# Patient Record
Sex: Male | Born: 1955 | Hispanic: Yes | Smoking: Never smoker
Health system: Southern US, Community
[De-identification: ages and names within clinical notes are randomized; demographics above are authoritative.]

## PROBLEM LIST (undated history)

## (undated) DIAGNOSIS — I10 Essential (primary) hypertension: Secondary | ICD-10-CM

## (undated) DIAGNOSIS — E119 Type 2 diabetes mellitus without complications: Secondary | ICD-10-CM

## (undated) DIAGNOSIS — N200 Calculus of kidney: Secondary | ICD-10-CM

## (undated) DIAGNOSIS — I2510 Atherosclerotic heart disease of native coronary artery without angina pectoris: Secondary | ICD-10-CM

## (undated) DIAGNOSIS — D759 Disease of blood and blood-forming organs, unspecified: Secondary | ICD-10-CM

## (undated) DIAGNOSIS — E7800 Pure hypercholesterolemia, unspecified: Secondary | ICD-10-CM

## (undated) HISTORY — DX: Atherosclerotic heart disease of native coronary artery without angina pectoris: I25.10

## (undated) HISTORY — DX: Pure hypercholesterolemia, unspecified: E78.00

## (undated) HISTORY — DX: Type 2 diabetes mellitus without complications: E11.9

## (undated) HISTORY — DX: Disease of blood and blood-forming organs, unspecified: D75.9

## (undated) HISTORY — PX: CORONARY ANGIOPLASTY WITH STENT PLACEMENT: SHX49

## (undated) HISTORY — DX: Calculus of kidney: N20.0

## (undated) HISTORY — DX: Essential (primary) hypertension: I10

## (undated) HISTORY — PX: VENA CAVA FILTER PLACEMENT: SHX1085

---

## 2014-09-09 ENCOUNTER — Ambulatory Visit: Payer: Self-pay | Admitting: Internal Medicine

## 2014-11-13 ENCOUNTER — Emergency Department (HOSPITAL_COMMUNITY)
Admission: EM | Admit: 2014-11-13 | Discharge: 2014-11-13 | Disposition: A | Discharge status: Home / Self Care | Payer: 59 | Attending: Emergency Medicine | Admitting: Emergency Medicine

## 2014-11-13 ENCOUNTER — Encounter (HOSPITAL_COMMUNITY): Payer: Self-pay | Admitting: *Deleted

## 2014-11-13 ENCOUNTER — Emergency Department (HOSPITAL_COMMUNITY): Payer: 59

## 2014-11-13 DIAGNOSIS — N201 Calculus of ureter: Secondary | ICD-10-CM | POA: Diagnosis not present

## 2014-11-13 DIAGNOSIS — I10 Essential (primary) hypertension: Secondary | ICD-10-CM | POA: Insufficient documentation

## 2014-11-13 DIAGNOSIS — E119 Type 2 diabetes mellitus without complications: Secondary | ICD-10-CM | POA: Diagnosis not present

## 2014-11-13 DIAGNOSIS — Z87442 Personal history of urinary calculi: Secondary | ICD-10-CM | POA: Diagnosis not present

## 2014-11-13 DIAGNOSIS — Z9861 Coronary angioplasty status: Secondary | ICD-10-CM | POA: Diagnosis not present

## 2014-11-13 DIAGNOSIS — R109 Unspecified abdominal pain: Secondary | ICD-10-CM | POA: Diagnosis present

## 2014-11-13 DIAGNOSIS — Z7902 Long term (current) use of antithrombotics/antiplatelets: Secondary | ICD-10-CM | POA: Insufficient documentation

## 2014-11-13 DIAGNOSIS — Z862 Personal history of diseases of the blood and blood-forming organs and certain disorders involving the immune mechanism: Secondary | ICD-10-CM | POA: Insufficient documentation

## 2014-11-13 DIAGNOSIS — N23 Unspecified renal colic: Secondary | ICD-10-CM

## 2014-11-13 DIAGNOSIS — Z79899 Other long term (current) drug therapy: Secondary | ICD-10-CM | POA: Diagnosis not present

## 2014-11-13 LAB — URINALYSIS, ROUTINE W REFLEX MICROSCOPIC
BILIRUBIN URINE: NEGATIVE
Glucose, UA: NEGATIVE mg/dL
Ketones, ur: NEGATIVE mg/dL
Nitrite: NEGATIVE
PROTEIN: NEGATIVE mg/dL
Specific Gravity, Urine: 1.026 (ref 1.005–1.030)
Urobilinogen, UA: 1 mg/dL (ref 0.0–1.0)
pH: 5.5 (ref 5.0–8.0)

## 2014-11-13 LAB — COMPREHENSIVE METABOLIC PANEL
ALT: 26 U/L (ref 0–53)
AST: 23 U/L (ref 0–37)
Albumin: 4.3 g/dL (ref 3.5–5.2)
Alkaline Phosphatase: 105 U/L (ref 39–117)
Anion gap: 9 (ref 5–15)
BUN: 15 mg/dL (ref 6–23)
CO2: 28 mmol/L (ref 19–32)
Calcium: 9.3 mg/dL (ref 8.4–10.5)
Chloride: 101 mmol/L (ref 96–112)
Creatinine, Ser: 0.95 mg/dL (ref 0.50–1.35)
GFR calc Af Amer: >90 mL/min (ref >90)
Glucose, Bld: 151 mg/dL — ABNORMAL HIGH (ref 70–99)
Potassium: 5.1 mmol/L (ref 3.5–5.1)
Sodium: 138 mmol/L (ref 135–145)
Total Bilirubin: 0.8 mg/dL (ref 0.3–1.2)
Total Protein: 7.3 g/dL (ref 6.0–8.3)

## 2014-11-13 LAB — CBC WITH DIFFERENTIAL/PLATELET
BASOS ABS: 0 10*3/uL (ref 0.0–0.1)
BASOS PCT: 0 % (ref 0–1)
EOS ABS: 0.1 10*3/uL (ref 0.0–0.7)
EOS PCT: 1 % (ref 0–5)
HEMATOCRIT: 44.8 % (ref 39.0–52.0)
Hemoglobin: 15.2 g/dL (ref 13.0–17.0)
Lymphocytes Relative: 15 % (ref 12–46)
Lymphs Abs: 1.4 10*3/uL (ref 0.7–4.0)
MCH: 31.3 pg (ref 26.0–34.0)
MCHC: 33.9 g/dL (ref 30.0–36.0)
MCV: 92.4 fL (ref 78.0–100.0)
Monocytes Absolute: 0.6 10*3/uL (ref 0.1–1.0)
Monocytes Relative: 6 % (ref 3–12)
NEUTROS ABS: 7.7 10*3/uL (ref 1.7–7.7)
NEUTROS PCT: 78 % — AB (ref 43–77)
PLATELETS: 224 10*3/uL (ref 150–400)
RBC: 4.85 MIL/uL (ref 4.22–5.81)
RDW: 14.1 % (ref 11.5–15.5)
WBC: 9.9 10*3/uL (ref 4.0–10.5)

## 2014-11-13 LAB — LIPASE, BLOOD: LIPASE: 27 U/L (ref 11–59)

## 2014-11-13 LAB — URINE MICROSCOPIC-ADD ON

## 2014-11-13 MED ORDER — HYDROMORPHONE HCL 1 MG/ML IJ SOLN
1.0000 mg | Freq: Once | INTRAMUSCULAR | Status: AC
  Administered 2014-11-13: 1 mg via INTRAMUSCULAR
  Filled 2014-11-13: qty 1

## 2014-11-13 MED ORDER — ONDANSETRON 8 MG PO TBDP
8.0000 mg | ORAL_TABLET | Freq: Once | ORAL | Status: AC
  Administered 2014-11-13: 8 mg via ORAL
  Filled 2014-11-13: qty 1

## 2014-11-13 MED ORDER — TAMSULOSIN HCL 0.4 MG PO CAPS: 0.4000 mg | ORAL_CAPSULE | Freq: Every day | ORAL | Status: DC

## 2014-11-13 MED ORDER — OXYCODONE-ACETAMINOPHEN 5-325 MG PO TABS: ORAL_TABLET | ORAL | Status: DC

## 2014-11-13 MED ORDER — KETOROLAC TROMETHAMINE 60 MG/2ML IM SOLN
30.0000 mg | Freq: Once | INTRAMUSCULAR | Status: AC
  Administered 2014-11-13: 30 mg via INTRAMUSCULAR
  Filled 2014-11-13: qty 2

## 2014-11-13 MED ORDER — ONDANSETRON HCL 4 MG PO TABS: 4.0000 mg | ORAL_TABLET | Freq: Three times a day (TID) | ORAL | Status: DC | PRN

## 2014-11-13 NOTE — Discharge Instructions (Signed)
°Emergency Department Resource Guide °1) Find a Doctor and Pay Out of Pocket °Although you won't have to find out who is covered by your insurance plan, it is a good idea to ask around and get recommendations. You will then need to call the office and see if the doctor you have chosen will accept you as a new patient and what types of options they offer for patients who are self-pay. Some doctors offer discounts or will set up payment plans for their patients who do not have insurance, but you will need to ask so you aren't surprised when you get to your appointment. ° °2) Contact Your Local Health Department °Not all health departments have doctors that can see patients for sick visits, but many do, so it is worth a call to see if yours does. If you don't know where your local health department is, you can check in your phone book. The CDC also has a tool to help you locate your state's health department, and many state websites also have listings of all of their local health departments. ° °3) Find a Walk-in Clinic °If your illness is not likely to be very severe or complicated, you may want to try a walk in clinic. These are popping up all over the country in pharmacies, drugstores, and shopping centers. They're usually staffed by nurse practitioners or physician assistants that have been trained to treat common illnesses and complaints. They're usually fairly quick and inexpensive. However, if you have serious medical issues or chronic medical problems, these are probably not your best option. ° °No Primary Care Doctor: °- Call [facility] at  832-8000 - they can help you locate a primary care doctor that  accepts your insurance, provides certain services, etc. °- Physician Referral Service- 1-800-533-3463 ° °Chronic Pain Problems: °Organization         Address  Phone   Notes  °[facility]  (336) 297-2271 Patients need to be referred by their primary care doctor.  ° °Medication  Assistance: °Organization         Address  Phone   Notes  °[place] [facility] [address] °[place], [place] [address] (336) 641-8030 --Must be a resident of [place] °-- Must have NO insurance coverage whatsoever (no Medicaid/ Medicare, etc.) °-- The pt. MUST have a primary care doctor that directs their care regularly and follows them in the community °  °[facility]  (866) 331-1348   °[employer]  (888) 892-1162   ° °Agencies that provide inexpensive medical care: °Organization         Address  Phone   Notes  °[facility]  (336) 832-8035   °[facility]    (336) 832-7272   °[facility] [address] °[place], [place] [address] (336) 832-4777   °[facility] [address], °[place] (336) 271-4999   °[facility]    (336) 373-0678   °[facility]    (336) 272-1050   °[facility] ° [address], [place] Phone:  (336) 832-4444, Fax:  (336) 832-4440 Hours of Operation:  9 am - 6 pm, M-F.  Also accepts Medicaid/Medicare and self-pay.  °[facility] ° [address], [place] Phone: (336) 832-3150, Fax: (336) [phone]. Hours of Operation:  8:30 am - 5:30 pm, M-F.  Also accepts Medicaid and self-pay.  °[facility] [place] [address], [place] Phone: (336) 878-6027   °[facility] [address], [place], [place] (336)723-1848, Ext. 123 Mondays & Thursdays: 7-9 AM.  First 15 patients are seen on a first come, first serve basis. °  ° °Medicaid-accepting [place] Providers: ° °Organization         Address  Phone   Notes  °[facility] [address], [place] (336) 641-2100 Also accepts self-pay patients.  °[facility] [address], [place] ° (336) 856-9996   °[facility] [address], [place]  (336) 288-8857   °[facility] [address] [place] [address], [place] (336) 299-7000   °Veita Bland [address], [place]  ° (336) 373-1557 Only accepts [place] Access Medicaid patients after they have their name applied to their card.  ° °Self-Pay (no insurance) in [place]: ° °Organization         Address  Phone   Notes  °Sickle Cell Patients, [facility] [address], [place] (336) 832-1970   °[facility] [address], [place] (336) 832-4400   °[facility] ° [address], [place] (336) 992-4800   °[facility]/Dr. Osei-Bonsu ° [address] [place] [address], [place] or [address], [place] (336) [phone] Phone number for both [place] and [place] locations is the same.  °[facility] [address], [place] (336) 299-0000   °[facility] [address] [place] [address], [place] or [address] (336) 852-7530 °(336) 878-2260   °[facility] [address], [place] (336) 350-1642, phone; (336) 294-5005, fax Sees patients 1st and 3rd Saturday of every month.  Must not qualify for public or private insurance (i.e. Medicaid, Medicare, [place] Health Choice, Veterans' Benefits) • Household income should be no more than 200% of the poverty level •The clinic cannot treat you if you are pregnant or think you are pregnant • Sexually transmitted diseases are not treated at the clinic.  ° ° °Dental Care: °Organization         Address  Phone  Notes  °[place] [facility] [address], [place] (336) 641-6152 Accepts children up to age 21 who are enrolled in Medicaid or [place] Health Choice; pregnant women with a Medicaid card; and children who have applied for Medicaid or [place] Health Choice, but were declined, whose parents can pay a reduced fee at time of service.  °[place]  [facility] [place]  [address], [place] (336) 641-7733 Accepts children up to age 21 who are enrolled in Medicaid or [place] Health Choice; pregnant women with a Medicaid card; and children who have applied for Medicaid or [place] Health Choice, but were declined, whose parents can pay a reduced fee at time of service.  °[facility] ° [address],  (336) 641-4533 Patients are seen by appointment only. Walk-ins are not accepted. [facility] will see patients 18 years of age and older. °Monday - Tuesday (8am-5pm) °Most Wednesdays (8:30-5pm) °$30 per visit, cash only  °[facility] ° [address], [place] (336) 641-4533 Patients are seen by appointment only. Walk-ins are not accepted. [facility] will see patients 18 years of age and older. °One   Wednesday Evening (Monthly: Volunteer Based).  $30 per visit, cash only  °[facility]  (919) 537-3737 for adults; Children under age 4, call Graduate Pediatric Dentistry at ([phone]) [phone]. Children aged 4-14, please call (919) [phone] to request a pediatric application. ° Dental services are provided in all areas of dental care including fillings, crowns and bridges, complete and partial dentures, implants, gum treatment, root canals, and extractions. Preventive care is also provided. Treatment is provided to both adults and children. °Patients are selected via a lottery and there is often a waiting list. °  °[facility] [address], °[place] ° (336) 763-8833 [email or web address] °  °[facility] [address], [place], [place] (336)723-1848, Ext. 123 Second and Fourth Thursday of each month, opens at 6:30 AM; Clinic ends at 9 AM.  Patients are seen on a first-come first-served basis, and a limited number are seen during each clinic.  ° °[facility] ° [address], [place], [place] (336) 723-7904    Eligibility Requirements °You must have lived in [place], [place], or [place] counties for at least the last three months. °  You cannot be eligible for state or federal sponsored healthcare insurance, including Veterans Administration, Medicaid, or Medicare. °  You generally cannot be eligible for healthcare insurance through your employer.  °  How to apply: °Eligibility screenings are held every Tuesday and Wednesday afternoon from 1:00 pm until 4:00 pm. You do not need an appointment for the interview!  °[facility] [address], [place], [place] 336-631-2330   °[facility]  336-342-8273   °[facility]  336-703-3100   °[facility]  336-570-6415   ° °Behavioral Health Resources in the Community: °Intensive Outpatient Programs °Organization         Address  Phone  Notes  °[facility] [address], [place], [place] 336-878-6098   °[facility] [address], [place], [place] 336-832-9800   °[facility] [address], [place], [place] ° 336-882-2125   °[facility] [address],  °[place], [place] 1-800-853-5163 or 336-641-4981   °Substance Abuse Resources °Organization         Address  Phone  Notes  °[facility]  336-882-2125   °[facility]  336-784-9470   °[facility]  336-285-9073   °[facility]  336-845-3988   °Residential & Outpatient Substance Abuse Program  1-800-659-3381   °Psychological Services °Organization         Address  Phone  Notes  °[facility]  336- 832-9600   °[facility]  336- 378-7881   °[facility] [address], [place] 1-800-853-5163 or 336-641-4981   ° °Mobile Crisis Teams °Organization         Address  Phone  Notes  °[facility], Mobile Crisis Care Unit  1-877-626-1772   °Assertive °[facility] ° [address]  [place], [place] 336-834-9664   °Sharon DeEsch [address] ° [place] 336-554-5454   ° °Self-Help/Support Groups °Organization         Address  Phone             Notes  °[facility]  - variety of support groups  336- 373-1402 Call for more information  °Narcotics Anonymous (NA), [facility] [address], °[place]  2 meetings at this location  ° °  Residential Treatment Programs °Organization         Address  Phone  Notes  °[facility] [address],    °[place]  1-866-801-8205   °[facility] ° [address], [place], [place] 704-293-8524   °[facility] [address], [place] 336-845-3988 Admissions: 8am-3pm M-F  °[facility] [address]    °[place], [place] 336-841-1104   °[facility] [address], [place], [place] 336-379-7146   °[facility] [address]  °[place], [place] 336-285-9073   °[facility] - Intensive Outpatient [address], [place], [place] 336-852-3033   °[facility] [address]  °[place], [place] 1-877-615-2722 or 336-784-9470   °[facility] [address] [place], [place] 336-227-7417 Accepts Medicaid  °[facility] [address]  °[place] 1-800-659-3381 Substance Abuse/Addiction Treatment  ° °[place] Behavioral Health Resources °Organization         Address  Phone  Notes  °[facility]  (888) 581-9988   °Julie Brannon, PhD [address] [place], [place]   (336) 349-5553 or (336) 951-0000   °[facility]   [address] °[place], [place] (336) 349-4454   °[facility] [address], [place], [place] (336) 342-8316 Insurance/Medicaid/sponsorship through [facility]  °[facility] [address] [place] [address]                                    [place], [place] (336) 342-8316 Therapy/tele-psych/case    °[facility] [address]  ° [place], [place] (336) 349-2233    °Dr. Arfeen  (336) 349-4544   °[facility] [place]  [employer] [place] [facility] 1) [address], [place] °2) [address], [place] °3)  [address], [place] (336) 349-3220 °(336) 342-7768 ° °(336) 342-8140   °[place] [facility] (336) 342-1394 or (336) 342-3537 (After Hours)    ° ° ° °Take the prescriptions as directed.  Also take over the counter ibuprofen, 2 tablets by mouth every 4 hours with food, for the next week.  Call the Urologist tomorrow to schedule a follow up appointment this week.  Return to the Emergency Department immediately if worsening. ° °

## 2014-11-13 NOTE — ED Notes (Addendum)
Pt speaks limited English speaks Spanish, pt reports abdominal pain x2 days, pain 7/10, reports dysuria  And blood in urine.   Hx of HTN, has not been taking BP med

## 2014-11-13 NOTE — ED Provider Notes (Signed)
CSN: [id number]     Arrival date & time 11/13/14  1048 History   First MD Initiated Contact with Patient 11/13/14 1102     Chief Complaint  Patient presents with  . Abdominal Pain      HPI Pt was seen at 1110. Per pt, c/o sudden onset and persistence of waxing and waning right sided abd "pain" that began 2 days ago.  Pt describes the pain as "like my last kidney stone," and radiating into the right side of his back.  Has been associated with nausea and hematuria that started 4 days ago.  Denies vomiting/diarrhea, testicular pain/swelling, no dysuria, no black or blood in stools, no CP/SOB.     Past Medical History  Diagnosis Date  . High blood pressure   . High cholesterol   . Diabetes   . Blood disorder   . Kidney stones   . 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 Coronary angioplasty with stent placement    . Vena cava filter placement     Family History  Problem Relation Age of Onset  . Liver cancer Father   . High blood pressure Mother   . Ovarian cancer Mother    History  Substance Use Topics  . Smoking status: Never Smoker   . Smokeless tobacco: Not on file  . Alcohol Use: No    Review of Systems ROS: Statement: All systems negative except as marked or noted in the HPI; Constitutional: Negative for fever and chills. ; ; Eyes: Negative for eye pain, redness and discharge. ; ; ENMT: Negative for ear pain, hoarseness, nasal congestion, sinus pressure and sore throat. ; ; Cardiovascular: Negative for chest pain, palpitations, diaphoresis, dyspnea and peripheral edema. ; ; Respiratory: Negative for cough, wheezing and stridor. ; ; Gastrointestinal: +nausea, abd pain. Negative for vomiting, diarrhea, blood in stool, hematemesis, jaundice and rectal bleeding. . ; ; Genitourinary: Negative for dysuria, flank pain and +hematuria. ; ; Genital:  No penile drainage or rash, no testicular pain or swelling, no scrotal rash or swelling. ;;  Musculoskeletal: Negative for back pain and neck pain. Negative for swelling and trauma.; ; Skin: Negative for pruritus, rash, abrasions, blisters, bruising and skin lesion.; ; Neuro: Negative for headache, lightheadedness and neck stiffness. Negative for weakness, altered level of consciousness , altered mental status, extremity weakness, paresthesias, involuntary movement, seizure and syncope.     Allergies  Review of patient's allergies indicates no known allergies.  Home Medications   Prior to Admission medications   Medication Sig Start Date End Date Taking? Authorizing Provider  atorvastatin (LIPITOR) 20 MG tablet Take 20 mg by mouth daily.   Yes Historical Provider, MD  clopidogrel (PLAVIX) 75 MG tablet Take 75 mg by mouth daily.   Yes Historical Provider, MD  glipiZIDE (GLUCOTROL) 5 MG tablet 5 mg. 2 by mouth daily   Yes Historical Provider, MD  metFORMIN (GLUCOPHAGE-XR) 500 MG 24 hr tablet 500 mg. 2 by mouth daily   Yes Historical Provider, MD  metoprolol succinate (TOPROL-XL) 50 MG 24 hr tablet Take 50 mg by mouth daily.    Yes Historical Provider, MD   BP 132/81 mmHg  Pulse 69  Temp(Src) 98 F (36.7 C) (Oral)  Resp 17  SpO2 100% Physical Exam  1115: Physical examination:  Nursing notes reviewed; Vital signs and O2 SAT reviewed;  Constitutional: Well developed, Well nourished, Well hydrated, Uncomfortable appearing.; Head:  Normocephalic, atraumatic; Eyes: EOMI, PERRL, No  scleral icterus; ENMT: Mouth and pharynx normal, Mucous membranes moist; Neck: Supple, Full range of motion, No lymphadenopathy; Cardiovascular: Regular rate and rhythm, No gallop; Respiratory: Breath sounds clear & equal bilaterally, No wheezes.  Speaking full sentences with ease, Normal respiratory effort/excursion; Chest: Nontender, Movement normal; Abdomen: Soft, Nontender, Nondistended, Normal bowel sounds; Genitourinary: No CVA tenderness; Spine:  No midline CS, TS, LS tenderness.;; Extremities: Pulses  normal, No tenderness, No edema, No calf edema or asymmetry.; Neuro: AA&Ox3, Major CN grossly intact.  Speech clear. No gross focal motor or sensory deficits in extremities.; Skin: Color normal, Warm, Dry.   ED Course  Procedures     EKG Interpretation None      MDM  MDM Reviewed: nursing note and vitals Interpretation: labs and CT scan     Results for orders placed or performed during the hospital encounter of 11/13/14  Comprehensive metabolic panel  Result Value Ref Range   Sodium 138 135 - 145 mmol/L   Potassium 5.1 3.5 - 5.1 mmol/L   Chloride 101 96 - 112 mmol/L   CO2 28 19 - 32 mmol/L   Glucose, Bld 151 (H) 70 - 99 mg/dL   BUN 15 6 - 23 mg/dL   Creatinine, Ser 1.61 0.50 - 1.35 mg/dL   Calcium 9.3 8.4 - 09.6 mg/dL   Total Protein 7.3 6.0 - 8.3 g/dL   Albumin 4.3 3.5 - 5.2 g/dL   AST 23 0 - 37 U/L   ALT 26 0 - 53 U/L   Alkaline Phosphatase 105 39 - 117 U/L   Total Bilirubin 0.8 0.3 - 1.2 mg/dL   GFR calc non Af Amer >90 >90 mL/min   GFR calc Af Amer >90 >90 mL/min   Anion gap 9 5 - 15  Lipase, blood  Result Value Ref Range   Lipase 27 11 - 59 U/L  CBC with Differential  Result Value Ref Range   WBC 9.9 4.0 - 10.5 K/uL   RBC 4.85 4.22 - 5.81 MIL/uL   Hemoglobin 15.2 13.0 - 17.0 g/dL   HCT 04.5 40.9 - 81.1 %   MCV 92.4 78.0 - 100.0 fL   MCH 31.3 26.0 - 34.0 pg   MCHC 33.9 30.0 - 36.0 g/dL   RDW 91.4 78.2 - 95.6 %   Platelets 224 150 - 400 K/uL   Neutrophils Relative % 78 (H) 43 - 77 %   Neutro Abs 7.7 1.7 - 7.7 K/uL   Lymphocytes Relative 15 12 - 46 %   Lymphs Abs 1.4 0.7 - 4.0 K/uL   Monocytes Relative 6 3 - 12 %   Monocytes Absolute 0.6 0.1 - 1.0 K/uL   Eosinophils Relative 1 0 - 5 %   Eosinophils Absolute 0.1 0.0 - 0.7 K/uL   Basophils Relative 0 0 - 1 %   Basophils Absolute 0.0 0.0 - 0.1 K/uL  Urinalysis, Routine w reflex microscopic  Result Value Ref Range   Color, Urine AMBER (A) YELLOW   APPearance CLOUDY (A) CLEAR   Specific Gravity, Urine  1.026 1.005 - 1.030   pH 5.5 5.0 - 8.0   Glucose, UA NEGATIVE NEGATIVE mg/dL   Hgb urine dipstick LARGE (A) NEGATIVE   Bilirubin Urine NEGATIVE NEGATIVE   Ketones, ur NEGATIVE NEGATIVE mg/dL   Protein, ur NEGATIVE NEGATIVE mg/dL   Urobilinogen, UA 1.0 0.0 - 1.0 mg/dL   Nitrite NEGATIVE NEGATIVE   Leukocytes, UA TRACE (A) NEGATIVE  Urine microscopic-add on  Result Value Ref Range  RBC / HPF TOO NUMEROUS TO COUNT <3 RBC/hpf   Bacteria, UA RARE RARE   Urine-Other FIELD OBSCURED BY RBC'S    Ct Abdomen Pelvis Wo Contrast 11/13/2014   CLINICAL DATA:  Right side abdominal pain, dysuria  EXAM: CT ABDOMEN AND PELVIS WITHOUT CONTRAST  TECHNIQUE: Multidetector CT imaging of the abdomen and pelvis was performed following the standard protocol without IV contrast.  COMPARISON:  None.  FINDINGS: Sagittal images shows mild degenerative changes lower thoracic and lumbar spine. The lung bases are unremarkable.  Unenhanced liver is unremarkable. No calcified gallstones are noted within gallbladder. IVC filter in place. Mild atherosclerotic calcifications of distal abdominal aorta and common iliac arteries. No aortic aneurysm.  Unenhanced pancreas, spleen and adrenal glands are unremarkable.  There is mild right hydronephrosis and right hydroureter. Mild right perinephric and periureteral stranding. Nonobstructive calcified calculus in midpole of the right kidney measures 3 mm.  No proximal or mid ureteral calcified calculi are noted bilaterally.  Normal appendix. No pericecal inflammation. A central calcification within prostate gland measures 4.4 mm. The seminal vesicles are unremarkable.  In axial image 78 there is poorly visualized 2 mm calcified calculus in right UVJ/ urinary bladder wall. Mild distension of distal right ureter. No inguinal adenopathy.  Limited assessment of the urinary bladder which is under distended.  No small bowel obstruction.  No ascites or free air.  No adenopathy.  IMPRESSION: 1. There is  mild right hydronephrosis and right hydroureter. Mild right perinephric and periureteral stranding. 2. IVC filter in place. 3. There is poorly visualized 2 mm calcified calculus in right UVJ/urinary bladder wall. 4. Normal appendix.  No pericecal inflammation. 5. No small bowel obstruction.   Electronically Signed   By: Natasha MeadLiviu  Pop M.D.   On: 11/13/2014 12:51    1355:  Pt states he feels better after meds and wants to go home now. Will dose IM toradol before d/c. Will tx ureteral calculi symptomatically at this time, f/u Uro MD. Dx and testing d/w pt.  Questions answered.  Verb understanding, agreeable to d/c home with outpt f/u.   Samuel JesterKathleen Annebelle Bostic, DO 11/17/14 1123

## 2016-12-07 IMAGING — CT CT ABD-PELV W/O CM
1 series · 15 of 29 positions shown, 19 images · non-contrast
Comparison: None.

CLINICAL DATA: Right side abdominal pain, dysuria

EXAM:
CT ABDOMEN AND PELVIS WITHOUT CONTRAST
TECHNIQUE: Multidetector CT imaging of the abdomen and pelvis was performed
following the standard protocol without IV contrast.

[Series 4: lung · axial · 0.70mm/px · z∈[-181,-56]mm · 15 of 29 slices shown, 19 images]
[im 3/29  soft-tissue]
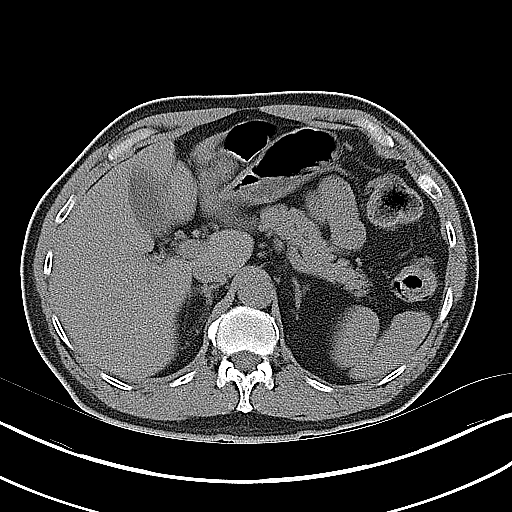
[im 3/29  bone]
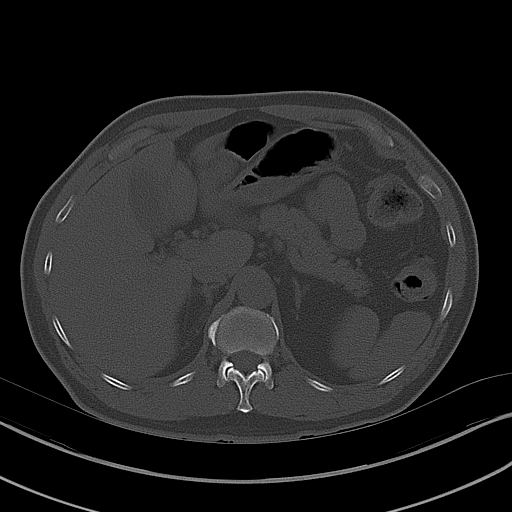
[im 5/29  soft-tissue]
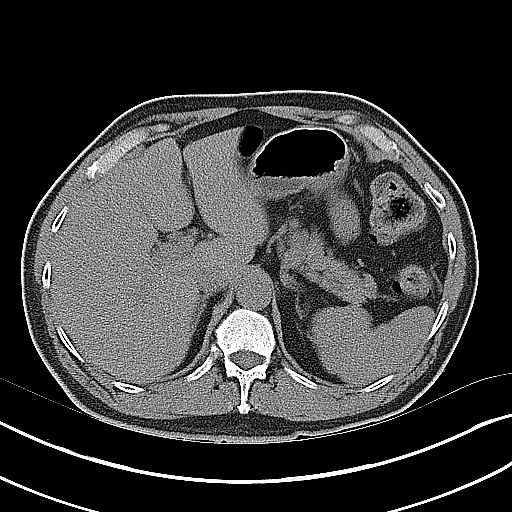
[im 7/29  soft-tissue]
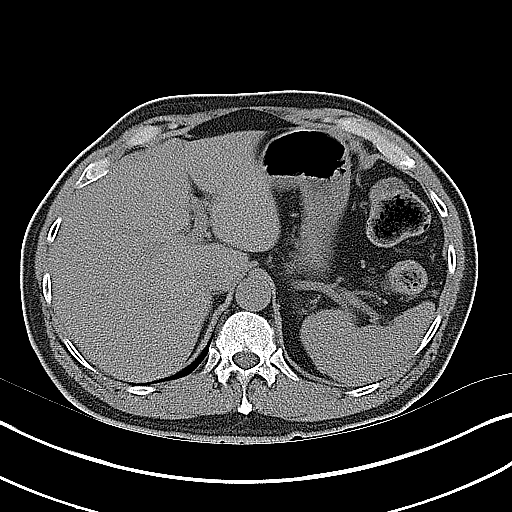
[im 9/29  soft-tissue]
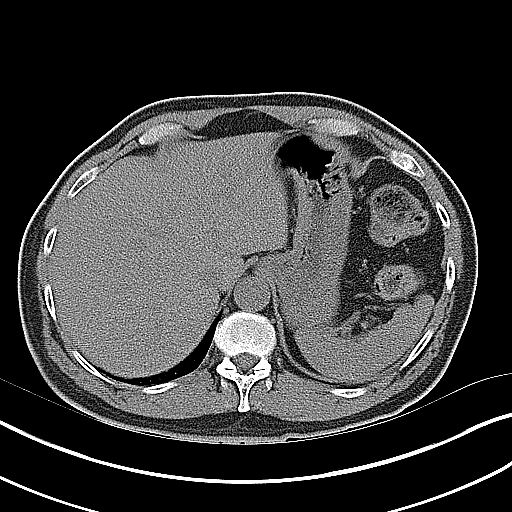
[im 11/29  soft-tissue]
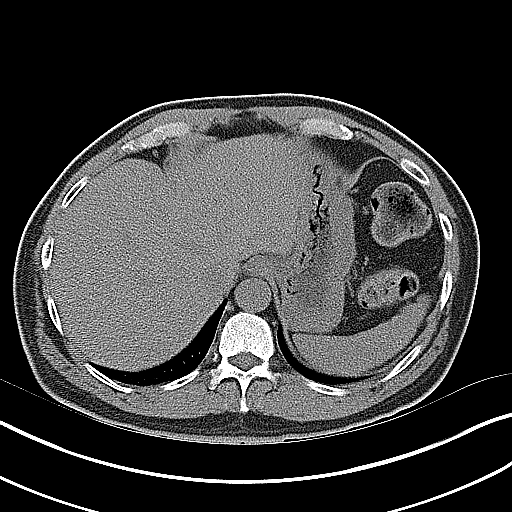
[im 13/29  soft-tissue]
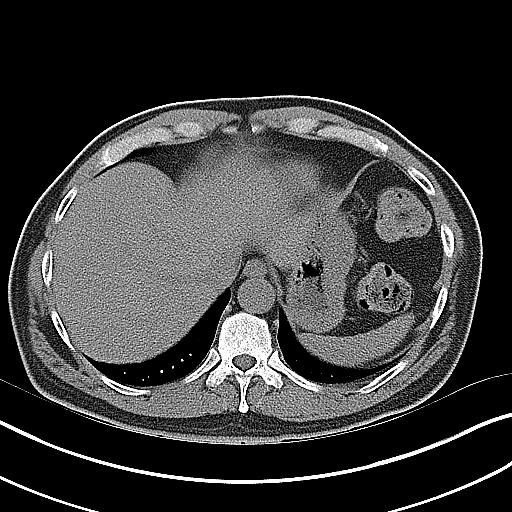
[im 15/29  soft-tissue]
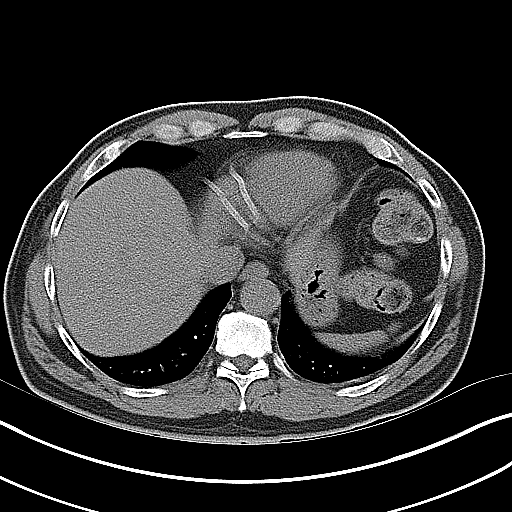
[im 17/29  soft-tissue]
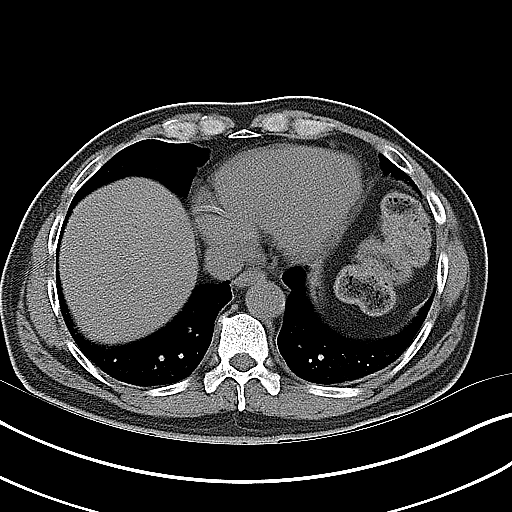
[im 19/29  soft-tissue]
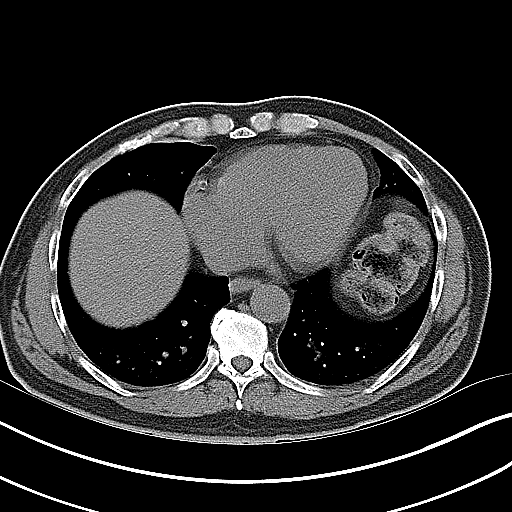
[im 19/29  bone]
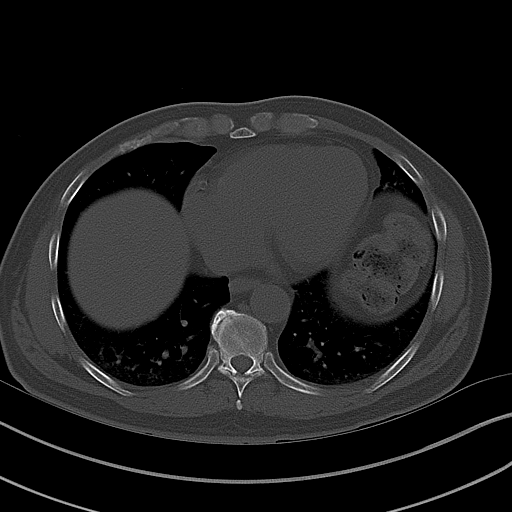
[im 21/29  soft-tissue]
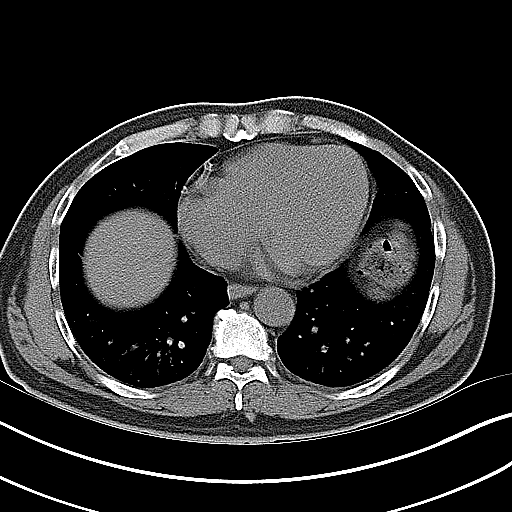
[im 23/29  soft-tissue]
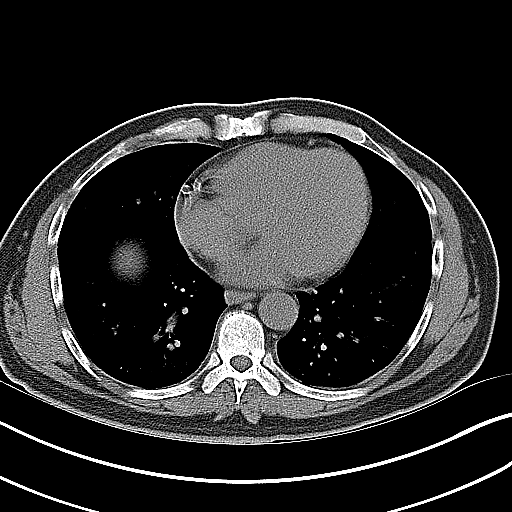
[im 25/29  soft-tissue]
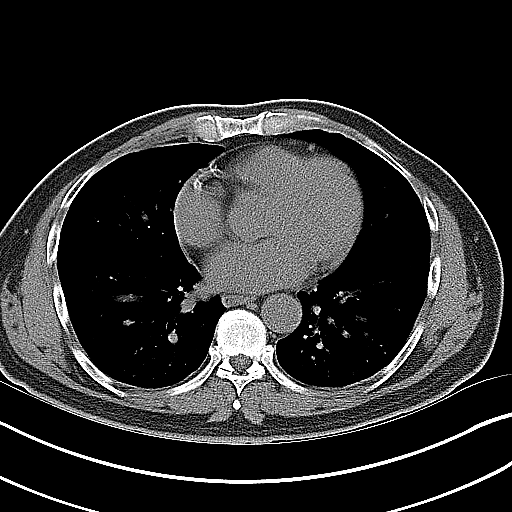
[im 25/29  lung]
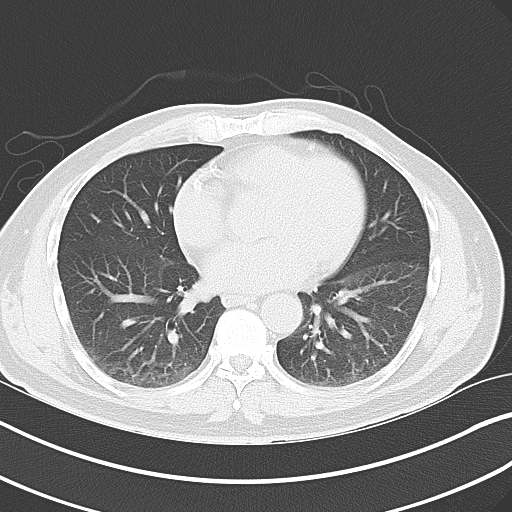
[im 26/29  lung]
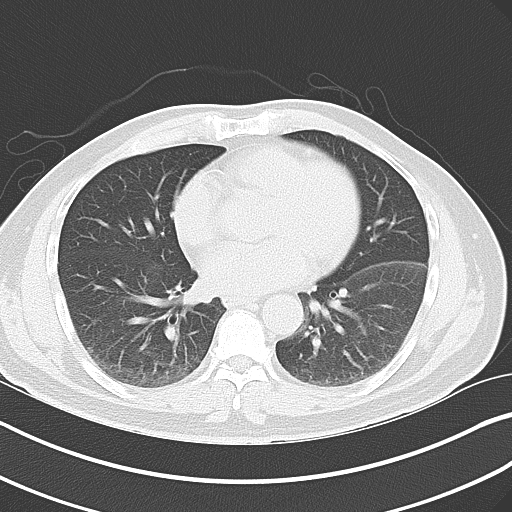
[im 27/29  soft-tissue]
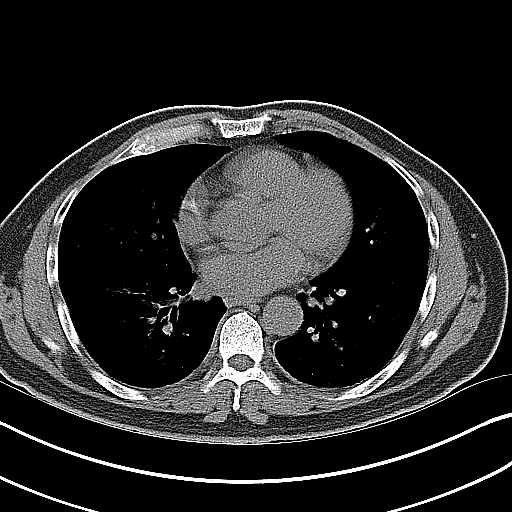
[im 27/29  lung]
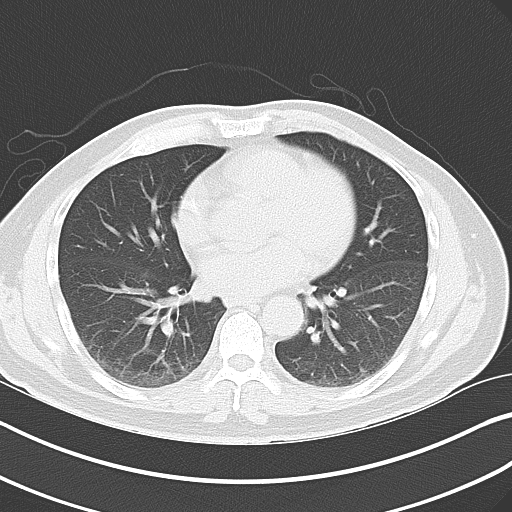
[im 28/29  lung]
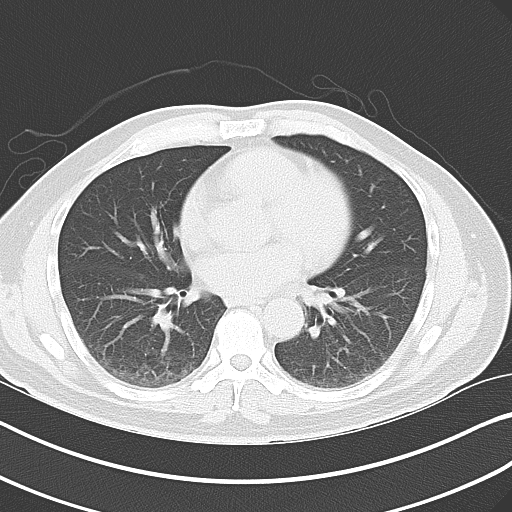

[15 of 29 positions shown; findings below may reference images not displayed]

FINDINGS: Sagittal images shows mild degenerative changes lower thoracic and
lumbar spine. The lung bases are unremarkable.

Unenhanced liver is unremarkable. No calcified gallstones are noted
within gallbladder. IVC filter in place. Mild atherosclerotic
calcifications of distal abdominal aorta and common iliac arteries.
No aortic aneurysm.

Unenhanced pancreas, spleen and adrenal glands are unremarkable.

There is mild right hydronephrosis and right hydroureter. Mild right
perinephric and periureteral stranding. Nonobstructive calcified
calculus in midpole of the right kidney measures 3 mm.

No proximal or mid ureteral calcified calculi are noted bilaterally.

Normal appendix. No pericecal inflammation. A central calcification
within prostate gland measures 4.4 mm. The seminal vesicles are
unremarkable.

In axial image 78 there is poorly visualized 2 mm calcified calculus
in right UVJ/ urinary bladder wall. Mild distension of distal right
ureter. No inguinal adenopathy.

Limited assessment of the urinary bladder which is under distended.

No small bowel obstruction.  No ascites or free air.  No adenopathy.
IMPRESSION: 1. There is mild right hydronephrosis and right hydroureter. Mild
right perinephric and periureteral stranding.
2. IVC filter in place.
3. There is poorly visualized 2 mm calcified calculus in right
UVJ/urinary bladder wall.
4. Normal appendix.  No pericecal inflammation.
5. No small bowel obstruction.

## 2017-05-28 ENCOUNTER — Emergency Department (HOSPITAL_COMMUNITY): Payer: Worker's Compensation

## 2017-05-28 ENCOUNTER — Encounter (HOSPITAL_COMMUNITY): Payer: Self-pay | Admitting: Emergency Medicine

## 2017-05-28 ENCOUNTER — Emergency Department (HOSPITAL_COMMUNITY)
Admission: EM | Admit: 2017-05-28 | Discharge: 2017-05-28 | Disposition: A | Discharge status: Home / Self Care | Payer: Worker's Compensation | Attending: Emergency Medicine | Admitting: Emergency Medicine

## 2017-05-28 DIAGNOSIS — M79631 Pain in right forearm: Secondary | ICD-10-CM | POA: Diagnosis present

## 2017-05-28 DIAGNOSIS — I10 Essential (primary) hypertension: Secondary | ICD-10-CM

## 2017-05-28 DIAGNOSIS — Z7984 Long term (current) use of oral hypoglycemic drugs: Secondary | ICD-10-CM | POA: Diagnosis not present

## 2017-05-28 DIAGNOSIS — Z79899 Other long term (current) drug therapy: Secondary | ICD-10-CM | POA: Diagnosis not present

## 2017-05-28 DIAGNOSIS — E119 Type 2 diabetes mellitus without complications: Secondary | ICD-10-CM | POA: Insufficient documentation

## 2017-05-28 NOTE — ED Provider Notes (Signed)
[facility]-EMERGENCY DEPT Provider Note   CSN: [id number] Arrival date & time: 05/28/17  4098     History   Chief Complaint Chief Complaint  Patient presents with  . Arm Pain    HPI Mike [name] is a 61 y.o. male.  HPI Patient presents with right forearm pain.  Began yesterday while he was lifting heavy boxes.  States it began acutely.  There is no fall.  No numbness or weakness.  States the pain has continued today.  No numbness or weakness.  It is on the upper forearm and worse when he turns his forearm.  Has not had pains like this before. Past Medical History:  Diagnosis Date  . Blood disorder   . Diabetes (HCC)   . High blood pressure   . High cholesterol   . Kidney stones   . 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   Past Surgical History:  Procedure Laterality Date  . CORONARY ANGIOPLASTY WITH STENT PLACEMENT    . VENA CAVA FILTER PLACEMENT         Home Medications    Prior to Admission medications   Medication Sig Start Date End Date Taking? Authorizing Provider  atorvastatin (LIPITOR) 20 MG tablet Take 20 mg by mouth daily.    [provider]  clopidogrel (PLAVIX) 75 MG tablet Take 75 mg by mouth daily.    [provider]  glipiZIDE (GLUCOTROL) 5 MG tablet 5 mg. 2 by mouth daily    [provider]  metFORMIN (GLUCOPHAGE-XR) 500 MG 24 hr tablet 500 mg. 2 by mouth daily    [provider]  metoprolol succinate (TOPROL-XL) 50 MG 24 hr tablet Take 50 mg by mouth daily.     [provider]  ondansetron (ZOFRAN) 4 MG tablet Take 1 tablet (4 mg total) by mouth every 8 (eight) hours as needed for nausea or vomiting. 11/13/14   Samuel Jester, DO  oxyCODONE-acetaminophen (PERCOCET/ROXICET) 5-325 MG per tablet 1 or 2 tabs PO q6h prn pain 11/13/14   Samuel Jester, DO  tamsulosin (FLOMAX) 0.4 MG CAPS capsule Take 1 capsule (0.4 mg total) by mouth at  bedtime. 11/13/14   Samuel Jester, DO    Family History Family History  Problem Relation Age of Onset  . Liver cancer Father   . High blood pressure Mother   . Ovarian cancer Mother     Social History Social History  Substance Use Topics  . Smoking status: Never Smoker  . Smokeless tobacco: Not on file  . Alcohol use No     Allergies   Patient has no known allergies.   Review of Systems Review of Systems  HENT: Negative for congestion.   Musculoskeletal: Negative for neck pain.       Right forearm pain.   Neurological: Negative for weakness and numbness.     Physical Exam Updated Vital Signs BP (!) 151/96 (BP Location: Left Arm)   Pulse 76   Temp 98.1 F (36.7 C) (Oral)   Resp 16   Ht 5\' 7"  (1.702 m)   Wt 76.2 kg (168 lb)   SpO2 100%   BMI 26.31 kg/m   Physical Exam  Constitutional: He appears well-developed.  Cardiovascular: Normal rate.   Musculoskeletal: He exhibits tenderness.  Tenderness to right forearm proximally.  Worse with pronation and supination of forearm.  Neurovascularly intact in hand.  Good strength in hand and good flexion  extension to fingers wrist elbows and shoulder.  Skin: Skin is warm. Capillary refill takes less than 2 seconds.     ED Treatments / Results  Labs (all labs ordered are listed, but only abnormal results are displayed) Labs Reviewed - No data to display  EKG  EKG Interpretation None       Radiology Dg Forearm Right  Result Date: 05/28/2017 CLINICAL DATA:  Generalized right forearm pain after lifting yesterday, no specific injury. EXAM: RIGHT FOREARM - 2 VIEW COMPARISON:  None. FINDINGS: No acute osseous abnormality. IMPRESSION: No acute osseous abnormality. Electronically Signed   By: Leanna BattlesMelinda  Blietz M.D.   On: 05/28/2017 09:30    Procedures Procedures (including critical care time)  Medications Ordered in ED Medications - No data to display   Initial Impression / Assessment and Plan / ED Course    I have reviewed the triage vital signs and the nursing notes.  Pertinent labs & imaging results that were available during my care of the patient were reviewed by me and considered in my medical decision making (see chart for details).     Patient with forearm pain.  Did have some bony tenderness over the radius.  X-ray reassuring.  Has high blood pressure will need to be followed by his primary care doctor.  Anti-inflammatories for the forearm.  Follow-up as needed.  Final Clinical Impressions(s) / ED Diagnoses   Final diagnoses:  Pain of right forearm  Hypertension, unspecified type    New Prescriptions New Prescriptions   No medications on file     Benjiman CorePickering, Elnathan Fulford, MD 05/28/17 1022

## 2017-05-28 NOTE — Discharge Instructions (Signed)
Use Motrin to help with the pain.  Follow-up with Ortho as needed.  Your blood pressure was also elevated here and needs to be followed by her primary care doctor.

## 2017-05-28 NOTE — ED Triage Notes (Signed)
Pt complaint of right forearm pain post lifting boxes yesterday.

## 2022-06-15 ENCOUNTER — Emergency Department (HOSPITAL_COMMUNITY): Payer: Self-pay

## 2022-06-15 ENCOUNTER — Observation Stay (HOSPITAL_COMMUNITY): Payer: Self-pay

## 2022-06-15 ENCOUNTER — Other Ambulatory Visit: Payer: Self-pay

## 2022-06-15 ENCOUNTER — Observation Stay (HOSPITAL_COMMUNITY)
Admission: EM | Admit: 2022-06-15 | Discharge: 2022-06-17 | Disposition: A | Discharge status: Home / Self Care | Payer: Self-pay | Attending: Emergency Medicine | Admitting: Emergency Medicine

## 2022-06-15 ENCOUNTER — Encounter (HOSPITAL_COMMUNITY): Payer: Self-pay | Admitting: Emergency Medicine

## 2022-06-15 DIAGNOSIS — I152 Hypertension secondary to endocrine disorders: Secondary | ICD-10-CM | POA: Insufficient documentation

## 2022-06-15 DIAGNOSIS — I639 Cerebral infarction, unspecified: Principal | ICD-10-CM | POA: Diagnosis present

## 2022-06-15 DIAGNOSIS — R079 Chest pain, unspecified: Secondary | ICD-10-CM | POA: Insufficient documentation

## 2022-06-15 DIAGNOSIS — I5021 Acute systolic (congestive) heart failure: Secondary | ICD-10-CM

## 2022-06-15 DIAGNOSIS — E119 Type 2 diabetes mellitus without complications: Secondary | ICD-10-CM

## 2022-06-15 DIAGNOSIS — E1169 Type 2 diabetes mellitus with other specified complication: Secondary | ICD-10-CM | POA: Insufficient documentation

## 2022-06-15 DIAGNOSIS — I10 Essential (primary) hypertension: Secondary | ICD-10-CM | POA: Insufficient documentation

## 2022-06-15 DIAGNOSIS — I6381 Other cerebral infarction due to occlusion or stenosis of small artery: Secondary | ICD-10-CM

## 2022-06-15 DIAGNOSIS — R531 Weakness: Secondary | ICD-10-CM

## 2022-06-15 LAB — RAPID URINE DRUG SCREEN, HOSP PERFORMED
Amphetamines: NOT DETECTED
Barbiturates: NOT DETECTED
Benzodiazepines: NOT DETECTED
Cocaine: NOT DETECTED
Opiates: NOT DETECTED
Tetrahydrocannabinol: NOT DETECTED

## 2022-06-15 LAB — I-STAT CHEM 8, ED
BUN: 16 mg/dL (ref 8–23)
Calcium, Ion: 1.13 mmol/L — ABNORMAL LOW (ref 1.15–1.40)
Chloride: 98 mmol/L (ref 98–111)
Creatinine, Ser: 0.7 mg/dL (ref 0.61–1.24)
Glucose, Bld: 306 mg/dL — ABNORMAL HIGH (ref 70–99)
HCT: 50 % (ref 39.0–52.0)
Hemoglobin: 17 g/dL (ref 13.0–17.0)
Potassium: 4.3 mmol/L (ref 3.5–5.1)
Sodium: 137 mmol/L (ref 135–145)
TCO2: 27 mmol/L (ref 22–32)

## 2022-06-15 LAB — URINALYSIS, ROUTINE W REFLEX MICROSCOPIC
Bacteria, UA: NONE SEEN
Bilirubin Urine: NEGATIVE
Glucose, UA: >=500 mg/dL — AB
Hgb urine dipstick: NEGATIVE
Ketones, ur: 20 mg/dL — AB
Leukocytes,Ua: NEGATIVE
Nitrite: NEGATIVE
Protein, ur: NEGATIVE mg/dL
Specific Gravity, Urine: 1.031 — ABNORMAL HIGH (ref 1.005–1.030)
pH: 7 (ref 5.0–8.0)

## 2022-06-15 LAB — DIFFERENTIAL
Abs Immature Granulocytes: 0.05 10*3/uL (ref 0.00–0.07)
Basophils Absolute: 0 10*3/uL (ref 0.0–0.1)
Basophils Relative: 0 %
Eosinophils Absolute: 0 10*3/uL (ref 0.0–0.5)
Eosinophils Relative: 1 %
Immature Granulocytes: 1 %
Lymphocytes Relative: 18 %
Lymphs Abs: 1.4 10*3/uL (ref 0.7–4.0)
Monocytes Absolute: 0.5 10*3/uL (ref 0.1–1.0)
Monocytes Relative: 6 %
Neutro Abs: 5.7 10*3/uL (ref 1.7–7.7)
Neutrophils Relative %: 74 %

## 2022-06-15 LAB — COMPREHENSIVE METABOLIC PANEL
ALT: 29 U/L (ref 0–44)
AST: 21 U/L (ref 15–41)
Albumin: 4.1 g/dL (ref 3.5–5.0)
Alkaline Phosphatase: 118 U/L (ref 38–126)
Anion gap: 15 (ref 5–15)
BUN: 13 mg/dL (ref 8–23)
CO2: 24 mmol/L (ref 22–32)
Calcium: 9.4 mg/dL (ref 8.9–10.3)
Chloride: 97 mmol/L — ABNORMAL LOW (ref 98–111)
Creatinine, Ser: 0.82 mg/dL (ref 0.61–1.24)
GFR, Estimated: >60 mL/min (ref >60)
Glucose, Bld: 307 mg/dL — ABNORMAL HIGH (ref 70–99)
Potassium: 4 mmol/L (ref 3.5–5.1)
Sodium: 136 mmol/L (ref 135–145)
Total Bilirubin: 0.6 mg/dL (ref 0.3–1.2)
Total Protein: 7.5 g/dL (ref 6.5–8.1)

## 2022-06-15 LAB — CBC
HCT: 46.2 % (ref 39.0–52.0)
Hemoglobin: 16 g/dL (ref 13.0–17.0)
MCH: 30.5 pg (ref 26.0–34.0)
MCHC: 34.6 g/dL (ref 30.0–36.0)
MCV: 88.2 fL (ref 80.0–100.0)
Platelets: 234 10*3/uL (ref 150–400)
RBC: 5.24 MIL/uL (ref 4.22–5.81)
RDW: 13.2 % (ref 11.5–15.5)
WBC: 7.8 10*3/uL (ref 4.0–10.5)
nRBC: 0 % (ref 0.0–0.2)

## 2022-06-15 LAB — APTT: aPTT: 35 seconds (ref 24–36)

## 2022-06-15 LAB — TROPONIN I (HIGH SENSITIVITY)
Troponin I (High Sensitivity): 28 ng/L — ABNORMAL HIGH (ref <18)
Troponin I (High Sensitivity): 34 ng/L — ABNORMAL HIGH (ref <18)
Troponin I (High Sensitivity): 34 ng/L — ABNORMAL HIGH (ref <18)

## 2022-06-15 LAB — TSH: TSH: 1.535 u[IU]/mL (ref 0.350–4.500)

## 2022-06-15 LAB — LIPID PANEL
Cholesterol: 277 mg/dL — ABNORMAL HIGH (ref 0–200)
HDL: 31 mg/dL — ABNORMAL LOW (ref >40)
Total CHOL/HDL Ratio: 8.9 RATIO
Triglycerides: 135 mg/dL (ref <150)
VLDL: 27 mg/dL (ref 0–40)

## 2022-06-15 LAB — CBG MONITORING, ED: Glucose-Capillary: 282 mg/dL — ABNORMAL HIGH (ref 70–99)

## 2022-06-15 LAB — PROTIME-INR
INR: 1 (ref 0.8–1.2)
Prothrombin Time: 12.9 seconds (ref 11.4–15.2)

## 2022-06-15 LAB — ETHANOL: Alcohol, Ethyl (B): <10 mg/dL (ref <10)

## 2022-06-15 LAB — HIV ANTIBODY (ROUTINE TESTING W REFLEX): HIV Screen 4th Generation wRfx: NONREACTIVE

## 2022-06-15 MED ORDER — ATORVASTATIN CALCIUM 40 MG PO TABS
40.0000 mg | ORAL_TABLET | Freq: Every day | ORAL | Status: DC
  Administered 2022-06-16: 40 mg via ORAL
  Filled 2022-06-15: qty 1

## 2022-06-15 MED ORDER — CLOPIDOGREL BISULFATE 75 MG PO TABS: 75.0000 mg | ORAL_TABLET | Freq: Once | ORAL | Status: DC

## 2022-06-15 MED ORDER — ASPIRIN 325 MG PO TABS
325.0000 mg | ORAL_TABLET | Freq: Once | ORAL | Status: AC
  Administered 2022-06-15: 325 mg via ORAL
  Filled 2022-06-15: qty 1

## 2022-06-15 MED ORDER — CLOPIDOGREL BISULFATE 75 MG PO TABS
75.0000 mg | ORAL_TABLET | Freq: Every day | ORAL | Status: DC
  Administered 2022-06-16 – 2022-06-17 (×2): 75 mg via ORAL
  Filled 2022-06-15 (×2): qty 1

## 2022-06-15 MED ORDER — ENOXAPARIN SODIUM 40 MG/0.4ML IJ SOSY
40.0000 mg | PREFILLED_SYRINGE | INTRAMUSCULAR | Status: DC
  Administered 2022-06-15 – 2022-06-16 (×2): 40 mg via SUBCUTANEOUS
  Filled 2022-06-15 (×2): qty 0.4

## 2022-06-15 MED ORDER — ASPIRIN 81 MG PO TBEC: 81.0000 mg | DELAYED_RELEASE_TABLET | Freq: Once | ORAL | Status: DC

## 2022-06-15 MED ORDER — ASPIRIN 81 MG PO TBEC: 81.0000 mg | DELAYED_RELEASE_TABLET | Freq: Every day | ORAL | Status: DC

## 2022-06-15 MED ORDER — CLOPIDOGREL BISULFATE 300 MG PO TABS
300.0000 mg | ORAL_TABLET | Freq: Once | ORAL | Status: AC
  Administered 2022-06-15: 300 mg via ORAL
  Filled 2022-06-15: qty 1

## 2022-06-15 MED ORDER — IOHEXOL 350 MG/ML SOLN
75.0000 mL | Freq: Once | INTRAVENOUS | Status: AC | PRN
  Administered 2022-06-15: 75 mL via INTRAVENOUS

## 2022-06-15 NOTE — Assessment & Plan Note (Addendum)
History of DM. Reports taking medications in the last but self discontinued due to side effects. -A1c pending -add sSSI as appropriate

## 2022-06-15 NOTE — H&P (Addendum)
Hospital Admission History and Physical Service Pager: [phone]  Patient name: Mike [name] Medical record number: [id number] Date of Birth: 11/10/1955 Age: 66 y.o. Gender: male  Primary Care Provider: Patient, No Pcp Per Consultants: Neurology Code Status: Full code  Preferred Emergency Contact: Rosaria Ferriesvan General (brother) [phone]  Chief Complaint: left-sided weakness  Assessment and Plan: Mike [name] is a 66 y.o. male presenting with stroke-like symptoms . Differential for this patient's presentation of this includes CVA (MRI and CT confirms this).    * Left-sided weakness Patient presented with left-sided weakness in upper and lower extremities and facial droop since yesterday. CT and MRI shows lacunar infarct on the right side. This is likely due to uncontrolled HTN, HLD, and DM. -Admit to [facility] med telemetry, attending Dr. Deirdre Priesthambliss -Neurology following, appreciate recs -Permissive HTN for 1-2 days -PT/OT/SLP -pending TSH, A1c -echo -am labs -Neurochecks -Up with assistance -NPO pending swallow screen -Vitals per routine   Diabetes (HCC) History of DM. Reports taking medications in the last but self discontinued due to side effects. -A1c pending -add sSSI as appropriate    HTN (hypertension) SBP ranging from 170-220s. Currently not on any medications in the setting of CVA. -Permissive HTN for 1-2 days  -hold home antihypertensive given permissive hypertension window  -Continue to monitor BP  Chest pain History of MI in 2014. Patient reports chest pain that started yesterday. -Repeat EKG -Troponin pending -CXR pending    Chronic medication conditions HLD-Will start statin per neurology recommendations  FEN/GI: NPO until swallow eval complete VTE Prophylaxis: lovenox  Disposition: home  History of Present Illness:  Mike [name] is a 66 y.o. male presenting with left-sided weakness and left facial droop. Patient reports that last night he noticed he  was experiencing left-sided weakness and facial droop on that side. Sister says that he has also had slurred speech. Patient was not able to pick up anything with his hands, he tried to grab a glass but was unable to do so. He also was unable to sit in his chair. Denies this ever occurring before. Denies fever, chills, pain and  headache. Endorsing chest pain and a little dyspnea that has worsened compared to normal that started last night that does not radiate. He also noticed vision changes about a week ago that occur intermittently with blurry vision at times. He was taking meds for his hypertension and cholesterol but stopped since he was feeling better. Also prescribed DM but not taking that as well. He stopped taking all these years ago, he is unsure of exactly when.     In the ED, CT and MRI performed.  Review Of Systems: Per HPI with the following additions:   Pertinent Past Medical History: Hypertension Hyperlipidemia DM   Pertinent Past Surgical History: None   Remainder reviewed in history tab.  Pertinent Social History: Tobacco use: No Alcohol use: occasionally before but none now  Other Substance use: none Lives with alone.  Pertinent Family History: Mother: hypertension   Remainder reviewed in history tab.   Important Outpatient Medications: Melatonin  Remainder reviewed in medication history.   Objective: BP (!) 140/92   Pulse 78   Temp 98.4 F (36.9 C) (Oral)   Resp 16   SpO2 96%  Exam: General: NAD, well-appearing, pleasant male in bed Cardiovascular: RRR, no m/r/g. No LE edema. Respiratory: Normal effort. CTAB.  Gastrointestinal: Normal bowel sounds.  Neuro:  A&Ox3. CN II-XII intact. Left sided facial droop present. Fine motor movements diminished on the  left side. Moves all extremities. Bilateral grip strength intact. 4/5 strength LUE and LLE. 5/5 strength RUE and RLE. Normal sensation. Dysarthria present on exam.   Labs:  CBC BMET  Recent Labs   Lab 06/15/22 1056 06/15/22 1103  WBC 7.8  --   HGB 16.0 17.0  HCT 46.2 50.0  PLT 234  --    Recent Labs  Lab 06/15/22 1056 06/15/22 1103  NA 136 137  K 4.0 4.3  CL 97* 98  CO2 24  --   BUN 13 16  CREATININE 0.82 0.70  GLUCOSE 307* 306*  CALCIUM 9.4  --       EKG: sinus tachycardia   Imaging Studies Performed:  DG CHEST PORT 1 VIEW  Result Date: 06/15/2022 CLINICAL DATA:  Dyspnea, left arm weakness EXAM: PORTABLE CHEST 1 VIEW COMPARISON:  None Available. FINDINGS: Mild cardiomegaly. Both lungs are clear. The visualized skeletal structures are unremarkable. IMPRESSION: Mild cardiomegaly without acute abnormality of the lungs in AP portable projection. Electronically Signed   By: Jearld Lesch M.D.   On: 06/15/2022 15:31   MR BRAIN WO CONTRAST  Result Date: 06/15/2022 CLINICAL DATA:  67 year old male code stroke presentation with age indeterminate right hemisphere lacunar infarct, abnormal CTA suspicious for intracranial atherosclerosis versus thromboembolic disease. EXAM: MRI HEAD WITHOUT CONTRAST TECHNIQUE: Multiplanar, multiecho pulse sequences of the brain and surrounding structures were obtained without intravenous contrast. COMPARISON:  CT head and CTA head and neck earlier today. FINDINGS: Brain: Confluent diffusion restriction in the right corona radiata and tracking to the right lentiform corresponding to the earlier plain CT finding. Associated T2 and FLAIR hyperintensity. No hemorrhage or mass effect. No other restricted diffusion. No midline shift, mass effect, evidence of mass lesion, ventriculomegaly, extra-axial collection or acute intracranial hemorrhage. Cervicomedullary junction and pituitary are within normal limits. Additional mild scattered bilateral, mostly subcortical, white matter T2 and FLAIR hyperintensity. No cortical encephalomalacia or chronic cerebral blood products identified. Left basal ganglia, bilateral thalami, brainstem and cerebellum appear  negative. Vascular: Major intracranial vascular flow voids are preserved. Skull and upper cervical spine: Negative. Visualized bone marrow signal is within normal limits. Sinuses/Orbits: Rightward gaze now. Paranasal sinuses remain well aerated. Other: Mastoids are clear.  Negative visible scalp and face. IMPRESSION: 1. Acute lacunar infarct in the Right corona radiata and lentiform corresponding to the earlier plain CT finding. No associated hemorrhage or mass effect. Preliminary report of this was discussed by telephone with Dr. Derry Lory on 06/15/2022 at 1215 hours. 2. Underlying mild for age nonspecific cerebral white matter signal changes. Electronically Signed   By: Odessa Fleming M.D.   On: 06/15/2022 13:03   CT ANGIO HEAD NECK W WO CM  Result Date: 06/15/2022 CLINICAL DATA:  Code stroke. 66 year old male with facial droop and weakness last known well 2300 hours. EXAM: CT ANGIOGRAPHY HEAD AND NECK TECHNIQUE: Multidetector CT imaging of the head and neck was performed using the standard protocol during bolus administration of intravenous contrast. Multiplanar CT image reconstructions and MIPs were obtained to evaluate the vascular anatomy. Carotid stenosis measurements (when applicable) are obtained utilizing NASCET criteria, using the distal internal carotid diameter as the denominator. RADIATION DOSE REDUCTION: This exam was performed according to the departmental dose-optimization program which includes automated exposure control, adjustment of the mA and/or kV according to patient size and/or use of iterative reconstruction technique. CONTRAST:  91mL OMNIPAQUE IOHEXOL 350 MG/ML SOLN COMPARISON:  Plain head CT 1104 hours today. FINDINGS: CTA NECK Skeleton: Carious posterior dentition on the  left. Mild for age cervical spine degeneration. No acute osseous abnormality identified. Upper chest: Negative. Other neck: Negative. Aortic arch: 3 vessel arch configuration. Soft more so than calcified arch  atherosclerosis. Right carotid system: Brachiocephalic and proximal right CCA soft plaque without stenosis. Intermittent soft plaque before the bifurcation. Mild to moderate soft plaque at the bifurcation and faint calcified right ICA bulb plaque without stenosis. Left carotid system: Similar mostly soft plaque with no significant stenosis. Vertebral arteries: Mild proximal right subclavian artery plaque without stenosis. Moderate soft plaque at the right vertebral artery origin with moderate stenosis on series 8, image 137. Right vertebral otherwise negative to the skull base. Left subclavian artery bulky soft plaque resulting in estimated 56% stenosis (series 7, image 291) but only mildly affecting the left vertebral artery origin on series 8, image 140. No significant origin stenosis. Mildly dominant left vertebral otherwise negative to the skull base. CTA HEAD Posterior circulation: Mildly dominant left vertebral V4 segment. No significant distal vertebral plaque or stenosis. Patent PICA origins. Patent and mildly irregular basilar artery with no significant stenosis. Patent SCA and left PCA origins. Fetal type right PCA origin. Left posterior communicating artery diminutive or absent. Right PCA branches are within normal limits. But there is up to moderate asymmetric irregularity and attenuation of the left PCA distal P2 and P3 segments best demonstrated on series 12, image 21. Anterior circulation: Both ICA siphons are patent, and the left appears dominant owing to dominant left and diminutive or absent right ACA A1 segments. No significant ICA siphon plaque or stenosis. Normal right posterior communicating artery origin. Patent carotid termini MCA origins. Anterior communicating artery and bilateral ACA branches are within normal limits. Left MCA M1 segment and bifurcation appear patent without stenosis. No left MCA branch occlusion identified. Right MCA M1 segment is patent although mildly to moderate Leigh  irregular and stenotic just proximal to the bifurcation. And at the bifurcation there is suspicion of a short 3 mm area of severe stenosis or near occlusion at the anterior M2 division. See series 10, image 21. Moderate irregularity and stenosis also at the other M2 division. But no complete MCA branch occlusion. Venous sinuses: Early contrast timing, grossly patent. Anatomic variants: Mildly dominant left vertebral artery. Fetal type right PCA origin. Dominant left and diminutive or absent right ACA A1 segments. Review of the MIP images confirms the above findings IMPRESSION: 1. No large vessel occlusion, but positive for multifocal intracranial irregularity and stenosis: - distal Right MCA M1 and bifurcation, with near occlusion of the anterior M2 division. - Moderate stenosis of the Left PCA distal P2 and P3 segments. This could be thromboembolic disease rather than intracranial atherosclerosis (see #2). 2. Extracranially there is abundant soft plaque in both cervical carotid arteries. No significant carotid stenosis but Moderate stenosis of the Right Vertebral Artery origin and 56% stenosis of proximal the Left subclavian artery both due to bulky soft plaque. Electronically Signed   By: Odessa Fleming M.D.   On: 06/15/2022 11:41   CT HEAD CODE STROKE WO CONTRAST  Result Date: 06/15/2022 CLINICAL DATA:  Code stroke. 66 year old male with facial droop and weakness last known well 2300 hours. EXAM: CT HEAD WITHOUT CONTRAST TECHNIQUE: Contiguous axial images were obtained from the base of the skull through the vertex without intravenous contrast. RADIATION DOSE REDUCTION: This exam was performed according to the departmental dose-optimization program which includes automated exposure control, adjustment of the mA and/or kV according to patient size and/or use of iterative reconstruction  technique. COMPARISON:  None Available. FINDINGS: Brain: Asymmetric confluent hypodensity in the right corona radiata tracking toward  the right lentiform on series 5 image 31. There seems to be minor associated mass effect on the nearby right lateral ventricle. No midline shift. No ventriculomegaly. No acute intracranial hemorrhage identified. Elsewhere gray-white differentiation is within normal limits throughout the brain. No cortically based acute infarct identified. Vascular: Faint Calcified atherosclerosis at the skull base. No suspicious intracranial vascular hyperdensity. Skull: No acute osseous abnormality identified. Sinuses/Orbits: Tympanic cavities, Visualized paranasal sinuses and mastoids are clear. Other: Visualized scalp soft tissues are within normal limits. Orbits appear within normal limits, no gaze deviation. ASPECTS [facility] Stroke Program Early CT Score) Total score (0-10 with 10 being normal): 10; white matter hypodensity in the right hemisphere but no cortical cytotoxic edema identified. IMPRESSION: 1. Age indeterminate lacunar infarct of the Right corona radiata and lentiform. No associated hemorrhage or significant mass effect. Query left side symptoms. 2. Otherwise negative for age, ASPECTS 10. 3. These results were communicated to Dr. Derry Lory at 11:13 am on 06/15/2022 by text page via the [facility] messaging system. Electronically Signed   By: Odessa Fleming M.D.   On: 06/15/2022 11:13      Kizzie Ide, MD 06/15/2022, 4:00 PM PGY-1, [facility]  I was personally present and performed or re-performed the history, physical exam and medical decision making activities of this service and have verified that the service and findings are accurately documented in the intern's note. My edits are noted within the note above as appropriate. Please also see attending's attestation.   Reece Leader, DO                  06/15/2022, 6:34 PM  PGY-3, [facility]   FPTS Intern pager: [phone], text pages welcome Secure chat group [facility] Teaching Service

## 2022-06-15 NOTE — Assessment & Plan Note (Addendum)
SBP ranging from 170-220s. Currently not on any medications in the setting of CVA. -Permissive HTN for 1-2 days  -hold home antihypertensive given permissive hypertension window  -Continue to monitor BP

## 2022-06-15 NOTE — ED Notes (Signed)
Per MD Dixon BP parameters 220/110 Q2hr NIH assessments

## 2022-06-15 NOTE — ED Provider Notes (Signed)
[facility] EMERGENCY DEPARTMENT Provider Note   CSN: [id number] Arrival date & time: 06/15/22  1016     History  Chief Complaint  Patient presents with   Code Stroke    Arnulfo Caffrey is a 66 y.o. male.  HPI Patient presents for strokelike symptoms.  Medical history includes diabetes, HLD, HTN.  Last night, he experienced left-sided weakness.  This caused him to have a fall.  This morning, family came to check on him at around 10 AM.  When they got there, they noticed left-sided facial droop.  Patient arrives as a code stroke.  Patient continues to endorse left hemibody weakness.     Home Medications Prior to Admission medications   Medication Sig Start Date End Date Taking? Authorizing Provider  atorvastatin (LIPITOR) 20 MG tablet Take 20 mg by mouth daily.    [provider]  clopidogrel (PLAVIX) 75 MG tablet Take 75 mg by mouth daily.    [provider]  glipiZIDE (GLUCOTROL) 5 MG tablet 5 mg. 2 by mouth daily    [provider]  metFORMIN (GLUCOPHAGE-XR) 500 MG 24 hr tablet 500 mg. 2 by mouth daily    [provider]  metoprolol succinate (TOPROL-XL) 50 MG 24 hr tablet Take 50 mg by mouth daily.     [provider]  ondansetron (ZOFRAN) 4 MG tablet Take 1 tablet (4 mg total) by mouth every 8 (eight) hours as needed for nausea or vomiting. 11/13/14   Francine Graven, DO  oxyCODONE-acetaminophen (PERCOCET/ROXICET) 5-325 MG per tablet 1 or 2 tabs PO q6h prn pain 11/13/14   Francine Graven, DO  tamsulosin (FLOMAX) 0.4 MG CAPS capsule Take 1 capsule (0.4 mg total) by mouth at bedtime. 11/13/14   Francine Graven, DO      Allergies    Patient has no known allergies.    Review of Systems   Review of Systems  Neurological:  Positive for facial asymmetry, speech difficulty, weakness and numbness.  All other systems reviewed and are negative.   Physical Exam Updated Vital Signs BP (!) 182/105   Pulse 95   Temp  97.7 F (36.5 C)   Resp 17   SpO2 97%  Physical Exam Vitals and nursing note reviewed.  Constitutional:      General: He is not in acute distress.    Appearance: He is well-developed and normal weight. He is not toxic-appearing or diaphoretic.  HENT:     Head: Normocephalic and atraumatic.     Right Ear: External ear normal.     Left Ear: External ear normal.     Nose: Nose normal.     Mouth/Throat:     Mouth: Mucous membranes are moist.     Pharynx: Oropharynx is clear.  Eyes:     Extraocular Movements: Extraocular movements intact.     Conjunctiva/sclera: Conjunctivae normal.  Cardiovascular:     Rate and Rhythm: Normal rate and regular rhythm.     Heart sounds: No murmur heard. Pulmonary:     Effort: Pulmonary effort is normal. No respiratory distress.  Abdominal:     General: There is no distension.     Palpations: Abdomen is soft.  Musculoskeletal:        General: No swelling, tenderness or deformity.     Cervical back: Normal range of motion and neck supple.  Skin:    General: Skin is warm and dry.     Capillary Refill: Capillary refill takes less than 2 seconds.  Neurological:  Mental Status: He is alert.     Cranial Nerves: Dysarthria and facial asymmetry present.     Motor: Weakness and pronator drift present.  Psychiatric:        Mood and Affect: Mood normal.        Behavior: Behavior normal.        Thought Content: Thought content normal.        Judgment: Judgment normal.     ED Results / Procedures / Treatments   Labs (all labs ordered are listed, but only abnormal results are displayed) Labs Reviewed  COMPREHENSIVE METABOLIC PANEL - Abnormal; Notable for the following components:      Result Value   Chloride 97 (*)    Glucose, Bld 307 (*)    All other components within normal limits  I-STAT CHEM 8, ED - Abnormal; Notable for the following components:   Glucose, Bld 306 (*)    Calcium, Ion 1.13 (*)    All other components within normal limits   CBG MONITORING, ED - Abnormal; Notable for the following components:   Glucose-Capillary 282 (*)    All other components within normal limits  ETHANOL  PROTIME-INR  APTT  CBC  DIFFERENTIAL  RAPID URINE DRUG SCREEN, HOSP PERFORMED  URINALYSIS, ROUTINE W REFLEX MICROSCOPIC  LIPID PANEL    EKG EKG Interpretation  Date/Time:  Saturday June 15 2022 10:55:30 EST Ventricular Rate:  109 PR Interval:  144 QRS Duration: 98 QT Interval:  368 QTC Calculation: 495 R Axis:   19 Text Interpretation: Sinus tachycardia Nonspecific ST and T wave abnormality Abnormal ECG Confirmed by Godfrey Pick ([id number]) on 06/15/2022 11:07:54 AM  Radiology CT ANGIO HEAD NECK W WO CM  Result Date: 06/15/2022 CLINICAL DATA:  Code stroke. 66 year old male with facial droop and weakness last known well 2300 hours. EXAM: CT ANGIOGRAPHY HEAD AND NECK TECHNIQUE: Multidetector CT imaging of the head and neck was performed using the standard protocol during bolus administration of intravenous contrast. Multiplanar CT image reconstructions and MIPs were obtained to evaluate the vascular anatomy. Carotid stenosis measurements (when applicable) are obtained utilizing NASCET criteria, using the distal internal carotid diameter as the denominator. RADIATION DOSE REDUCTION: This exam was performed according to the departmental dose-optimization program which includes automated exposure control, adjustment of the mA and/or kV according to patient size and/or use of iterative reconstruction technique. CONTRAST:  53mL OMNIPAQUE IOHEXOL 350 MG/ML SOLN COMPARISON:  Plain head CT 1104 hours today. FINDINGS: CTA NECK Skeleton: Carious posterior dentition on the left. Mild for age cervical spine degeneration. No acute osseous abnormality identified. Upper chest: Negative. Other neck: Negative. Aortic arch: 3 vessel arch configuration. Soft more so than calcified arch atherosclerosis. Right carotid system: Brachiocephalic and proximal right  CCA soft plaque without stenosis. Intermittent soft plaque before the bifurcation. Mild to moderate soft plaque at the bifurcation and faint calcified right ICA bulb plaque without stenosis. Left carotid system: Similar mostly soft plaque with no significant stenosis. Vertebral arteries: Mild proximal right subclavian artery plaque without stenosis. Moderate soft plaque at the right vertebral artery origin with moderate stenosis on series 8, image 137. Right vertebral otherwise negative to the skull base. Left subclavian artery bulky soft plaque resulting in estimated 56% stenosis (series 7, image 291) but only mildly affecting the left vertebral artery origin on series 8, image 140. No significant origin stenosis. Mildly dominant left vertebral otherwise negative to the skull base. CTA HEAD Posterior circulation: Mildly dominant left vertebral V4 segment. No significant distal vertebral  plaque or stenosis. Patent PICA origins. Patent and mildly irregular basilar artery with no significant stenosis. Patent SCA and left PCA origins. Fetal type right PCA origin. Left posterior communicating artery diminutive or absent. Right PCA branches are within normal limits. But there is up to moderate asymmetric irregularity and attenuation of the left PCA distal P2 and P3 segments best demonstrated on series 12, image 21. Anterior circulation: Both ICA siphons are patent, and the left appears dominant owing to dominant left and diminutive or absent right ACA A1 segments. No significant ICA siphon plaque or stenosis. Normal right posterior communicating artery origin. Patent carotid termini MCA origins. Anterior communicating artery and bilateral ACA branches are within normal limits. Left MCA M1 segment and bifurcation appear patent without stenosis. No left MCA branch occlusion identified. Right MCA M1 segment is patent although mildly to moderate Leigh irregular and stenotic just proximal to the bifurcation. And at the  bifurcation there is suspicion of a short 3 mm area of severe stenosis or near occlusion at the anterior M2 division. See series 10, image 21. Moderate irregularity and stenosis also at the other M2 division. But no complete MCA branch occlusion. Venous sinuses: Early contrast timing, grossly patent. Anatomic variants: Mildly dominant left vertebral artery. Fetal type right PCA origin. Dominant left and diminutive or absent right ACA A1 segments. Review of the MIP images confirms the above findings IMPRESSION: 1. No large vessel occlusion, but positive for multifocal intracranial irregularity and stenosis: - distal Right MCA M1 and bifurcation, with near occlusion of the anterior M2 division. - Moderate stenosis of the Left PCA distal P2 and P3 segments. This could be thromboembolic disease rather than intracranial atherosclerosis (see #2). 2. Extracranially there is abundant soft plaque in both cervical carotid arteries. No significant carotid stenosis but Moderate stenosis of the Right Vertebral Artery origin and 56% stenosis of proximal the Left subclavian artery both due to bulky soft plaque. Electronically Signed   By: Genevie Ann M.D.   On: 06/15/2022 11:41   CT HEAD CODE STROKE WO CONTRAST  Result Date: 06/15/2022 CLINICAL DATA:  Code stroke. 66 year old male with facial droop and weakness last known well 2300 hours. EXAM: CT HEAD WITHOUT CONTRAST TECHNIQUE: Contiguous axial images were obtained from the base of the skull through the vertex without intravenous contrast. RADIATION DOSE REDUCTION: This exam was performed according to the departmental dose-optimization program which includes automated exposure control, adjustment of the mA and/or kV according to patient size and/or use of iterative reconstruction technique. COMPARISON:  None Available. FINDINGS: Brain: Asymmetric confluent hypodensity in the right corona radiata tracking toward the right lentiform on series 5 image 31. There seems to be minor  associated mass effect on the nearby right lateral ventricle. No midline shift. No ventriculomegaly. No acute intracranial hemorrhage identified. Elsewhere gray-white differentiation is within normal limits throughout the brain. No cortically based acute infarct identified. Vascular: Faint Calcified atherosclerosis at the skull base. No suspicious intracranial vascular hyperdensity. Skull: No acute osseous abnormality identified. Sinuses/Orbits: Tympanic cavities, Visualized paranasal sinuses and mastoids are clear. Other: Visualized scalp soft tissues are within normal limits. Orbits appear within normal limits, no gaze deviation. ASPECTS [facility] Stroke Program Early CT Score) Total score (0-10 with 10 being normal): 10; white matter hypodensity in the right hemisphere but no cortical cytotoxic edema identified. IMPRESSION: 1. Age indeterminate lacunar infarct of the Right corona radiata and lentiform. No associated hemorrhage or significant mass effect. Query left side symptoms. 2. Otherwise negative for age, ASPECTS  10. 3. These results were communicated to Dr. Lorrin Goodell at 11:13 am on 06/15/2022 by text page via the [facility] messaging system. Electronically Signed   By: Genevie Ann M.D.   On: 06/15/2022 11:13    Procedures Procedures    Medications Ordered in ED Medications  clopidogrel (PLAVIX) tablet 300 mg (has no administration in time range)  aspirin tablet 325 mg (has no administration in time range)  iohexol (OMNIPAQUE) 350 MG/ML injection 75 mL (75 mLs Intravenous Contrast Given 06/15/22 1123)    ED Course/ Medical Decision Making/ A&P                           Medical Decision Making Amount and/or Complexity of Data Reviewed Labs: ordered. Radiology: ordered.  Risk OTC drugs. Prescription drug management.   This patient presents to the ED for concern of strokelike symptoms, this involves an extensive number of treatment options, and is a complaint that carries with it a high risk of  complications and morbidity.  The differential diagnosis includes CVA, ICH, seizure, complex migraine, hypoglycemia, intoxication   Co morbidities that complicate the patient evaluation  diabetes, HLD, HTN, CAD, IVC filter placement   Additional history obtained:  Additional history obtained from patient's family External records from outside source obtained and reviewed including EMR   Lab Tests:  I Ordered, and personally interpreted labs.  The pertinent results include: Hyperglycemia without evidence of DKA, normal electrolytes, normal hemoglobin, no leukocytosis   Imaging Studies ordered:  I ordered imaging studies including CT head, CTA head and neck, MRI brain I independently visualized and interpreted imaging which showed CTA head did not show acute stroke, however, there are areas of severe stenosis and near occlusion.  MRI brain showed acute lacunar infarct in right corona radiata and lentiform. I agree with the radiologist interpretation   Cardiac Monitoring: / EKG:  The patient was maintained on a cardiac monitor.  I personally viewed and interpreted the cardiac monitored which showed an underlying rhythm of: Sinus rhythm   Consultations Obtained:  I requested consultation with the neurologist, Dr.Khaliqdina,  and discussed lab and imaging findings as well as pertinent plan - they recommend: Awaiting MRI imaging to determine disposition   Problem List / ED Course / Critical interventions / Medication management  Patient presents for left hemibody weakness.  Onset was last night.  Family noticed facial droop today when they went to check on him at 10 AM.  Symptoms continue on arrival in the ED.  Patient arrives as a code stroke.  Patient taken directly to CT scanner.  CT scan shows age-indeterminate right corona radiata stroke.  On assessment, patient resting comfortably.  He has obvious left facial droop with forehead sparing, and left hemibody weakness.  He remains  alert and oriented.  There are no airway concerns.  Blood pressure at this time is in the range of 220/120.  Neurology evaluated and initially recommends MRI, TTE, lipid panel, DAPT, permissive hypertension, and admission with telemetry monitoring.  After neurologist reviewed CTA, feel like it would be better to await MRI imaging results prior to disposition.  Patient remained in the emergency department with permissive hypertension.  On reassessment, blood pressure now in the range of 180/100.  Patient underwent MRI and, upon review by neurology, there is concern of lacunar stroke.  Patient to be admitted for further stroke work-up.  Loading dose of aspirin and Plavix were ordered.  Patient was admitted to family medicine  for further management. I ordered medication including ASA and Plavix for CVA Reevaluation of the patient after these medicines showed that the patient stayed the same I have reviewed the patients home medicines and have made adjustments as needed   Social Determinants of Health:  Does not have PCP   CRITICAL CARE Performed by: Gloris Manchester   Total critical care time: 34 minutes  Critical care time was exclusive of separately billable procedures and treating other patients.  Critical care was necessary to treat or prevent imminent or life-threatening deterioration.  Critical care was time spent personally by me on the following activities: development of treatment plan with patient and/or surrogate as well as nursing, discussions with consultants, evaluation of patient's response to treatment, examination of patient, obtaining history from patient or surrogate, ordering and performing treatments and interventions, ordering and review of laboratory studies, ordering and review of radiographic studies, pulse oximetry and re-evaluation of patient's condition.         Final Clinical Impression(s) / ED Diagnoses Final diagnoses:  Cerebrovascular accident (CVA), unspecified  mechanism [facility])    Rx / DC Orders ED Discharge Orders     None         Gloris Manchester, MD 06/15/22 1313

## 2022-06-15 NOTE — Progress Notes (Signed)
FMTS Brief Progress Note  S: States he is doing well, he was able to eat strawberries and carrots and drink without choking.  Asked questions regarding which side his stroke is on and where it came from.  Denies chest pain or difficulty breathing.  O: BP (!) 164/89   Pulse 83   Temp 98.4 F (36.9 C) (Oral)   Resp 16   SpO2 97%   General: Awake, alert, NAD, sitting up in bed CV: RRR, no murmurs auscultated Pulm: CTA B, normal WOB Neuro: Appropriately conversational but with poorly coherent speech due to left-sided facial droop, 4/5 strength of left upper extremity  A/P: Left-sided weakness 2/2 right lacunar infarct -Neurochecks every 4 hours -Permissive hypertension -Continue plan per day team  Diabetes -A1c pending, consider SSI as needed  Chest pain Troponin stable (28> 34> 34), chest pain resolved.  -Troponin pending, continue to monitor - Orders reviewed. Labs for AM ordered, which was adjusted as needed.   Shelby Mattocks, DO 06/15/2022, 8:52 PM PGY-2, [facility] Night Resident  Please page [phone] with questions.

## 2022-06-15 NOTE — ED Notes (Signed)
Pt in MRI, this nurse present

## 2022-06-15 NOTE — Consult Note (Signed)
NEUROLOGY CONSULTATION NOTE   Date of service: June 15, 2022 Patient Name: Mike [name] MRN:  [id number] DOB:  07-Aug-1955 Reason for consult: "L sided weakness" Requesting Provider: Gloris Manchester, MD _ _ _   _ __   _ __ _ _  __ __   _ __   __ _  History of Present Illness  Mike [name] is a 66 y.o. male with PMH significant for HTN, DM2, HLD, kidney stones, CAD who presents with L sided weakness.  Last night 2300, was watching TV, got up but felt off balance and fell. Left leg was worse than right. Also noted dropping objects from the left hand. This AM, fell again. Called family and family drove him to the ED. Enroute, he developed a L facial droop. On arrival to ED, stroke code activated.  LKW: 2300 on 06/14/22 mRS: 0 tNKASE: not offered, outside window Thrombectomy: not offered, low suspicion for LVO NIHSS components Score: Comment  1a Level of Conscious 0[x]  1[]  2[]  3[]      1b LOC Questions 0[x]  1[]  2[]       1c LOC Commands 0[x]  1[]  2[]       2 Best Gaze 0[x]  1[]  2[]       3 Visual 0[x]  1[]  2[]  3[]      4 Facial Palsy 0[]  1[x]  2[]  3[]      5a Motor Arm - left 0[]  1[x]  2[]  3[]  4[]  UN[]    5b Motor Arm - Right 0[x]  1[]  2[]  3[]  4[]  UN[]    6a Motor Leg - Left 0[x]  1[]  2[]  3[]  4[]  UN[]    6b Motor Leg - Right 0[x]  1[]  2[]  3[]  4[]  UN[]    7 Limb Ataxia 0[x]  1[]  2[]  3[]  UN[]     8 Sensory 0[x]  1[]  2[]  UN[]      9 Best Language 0[x]  1[]  2[]  3[]      10 Dysarthria 0[x]  1[]  2[]  UN[]      11 Extinct. and Inattention 0[x]  1[]  2[]       TOTAL: 2       ROS   Constitutional Denies weight loss, fever and chills.   HEENT Denies changes in vision and hearing.   Respiratory Denies SOB and cough.   CV Denies palpitations and CP   GI Denies abdominal pain, nausea, vomiting and diarrhea.   GU Denies dysuria and urinary frequency.   MSK Denies myalgia and joint pain.   Skin Denies rash and pruritus.   Neurological Denies headache and syncope.   Psychiatric Denies recent changes in mood. Denies  anxiety and depression.    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    Past Surgical History:  Procedure Laterality Date   CORONARY ANGIOPLASTY WITH STENT PLACEMENT     VENA CAVA FILTER PLACEMENT     Family History  Problem Relation Age of Onset   Liver cancer Father    High blood pressure Mother    Ovarian cancer Mother    Social History   Socioeconomic History   Marital status: Legally Separated    Spouse name: Not on file   Number of children: Not on file   Years of education: Not on file   Highest education level: Not on file  Occupational History   Not on file  Tobacco Use   Smoking status: Never   Smokeless tobacco: Not on  file  Substance and Sexual Activity   Alcohol use: No   Drug use: No   Sexual activity: Not on file  Other Topics Concern   Not on file  Social History Narrative   Diet: Low Carb   Caffeine: 1 cup of coffee daily   Marital Status: Separated, married 1992   Lives in house, 1 stories, 3 persons, no pets   Current/Past profession: Naval architect    Exercise: No   Living Will: No    DNR- question mark   POA/HPOA- No as of 09/06/14   Social Determinants of Health   Financial Resource Strain: Not on file  Food Insecurity: Not on file  Transportation Needs: Not on file  Physical Activity: Not on file  Stress: Not on file  Social Connections: Not on file   No Known Allergies  Medications  (Not in a hospital admission)    Vitals   Vitals:   06/15/22 1048 06/15/22 1110  BP: (!) 228/132 (!) 218/120  Pulse: (!) 113 (!) 106  Resp: 18 15  Temp: 97.7 F (36.5 C)   SpO2: 97% 99%     There is no height or weight on file to calculate BMI.  Physical Exam   General: Laying comfortably in bed; in no acute distress.  HENT: Normal oropharynx and mucosa. Normal external appearance of ears and nose.   Neck: Supple, no pain or tenderness  CV: No JVD. No peripheral edema.  Pulmonary: Symmetric Chest rise. Normal respiratory effort.  Abdomen: Soft to touch, non-tender.  Ext: No cyanosis, edema, or deformity  Skin: No rash. Normal palpation of skin.   Musculoskeletal: Normal digits and nails by inspection. No clubbing.   Neurologic Examination  Mental status/Cognition: Alert, oriented to self, place, month and year, good attention.  Speech/language: Fluent, comprehension intact, object naming intact, repetition intact.  Cranial nerves:   CN II Pupils equal and reactive to light, no VF deficits    CN III,IV,VI EOM intact, no gaze preference or deviation, no nystagmus    CN V normal sensation in V1, V2, and V3 segments bilaterally    CN VII L facial droop   CN VIII normal hearing to speech    CN IX & X normal palatal elevation, no uvular deviation    CN XI 5/5 head turn and 5/5 shoulder shrug bilaterally    CN XII midline tongue protrusion    Motor:  Muscle bulk: normal, tone normal, pronator drift mild LUE pronator drift. tremor none Mvmt Root Nerve  Muscle Right Left Comments  SA C5/6 Ax Deltoid 5 5   EF C5/6 Mc Biceps 5 5   EE C6/7/8 Rad Triceps 5 5   WF C6/7 Med FCR     WE C7/8 PIN ECU     F Ab C8/T1 U ADM/FDI 5 5   HF L1/2/3 Fem Illopsoas 5 5   KE L2/3/4 Fem Quad 5 5   DF L4/5 D Peron Tib Ant 5 5   PF S1/2 Tibial Grc/Sol 5 5    Reflexes:  Right Left Comments  Pectoralis      Biceps (C5/6) 2 2   Brachioradialis (C5/6) 2 2    Triceps (C6/7) 2 2    Patellar (L3/4) 2 2    Achilles (S1)      Hoffman      Plantar     Jaw jerk    Sensation:  Light touch Intact throughout   Pin prick    Temperature  Vibration   Proprioception    Coordination/Complex Motor:  - Finger to Nose intact BL - Heel to shin intact BL - Rapid alternating movement are slowed on the left - Gait: deferred  Labs   CBC:  Recent Labs  Lab 06/15/22 1056 06/15/22 1103  WBC 7.8  --    NEUTROABS 5.7  --   HGB 16.0 17.0  HCT 46.2 50.0  MCV 88.2  --   PLT 234  --     Basic Metabolic Panel:  Lab Results  Component Value Date   NA 137 06/15/2022   K 4.3 06/15/2022   CO2 28 11/13/2014   GLUCOSE 306 (H) 06/15/2022   BUN 16 06/15/2022   CREATININE 0.70 06/15/2022   CALCIUM 9.3 11/13/2014   GFRNONAA >90 11/13/2014   GFRAA >90 11/13/2014   Lipid Panel: No results found for: "LDLCALC" HgbA1c: No results found for: "HGBA1C" Urine Drug Screen: No results found for: "LABOPIA", "COCAINSCRNUR", "LABBENZ", "AMPHETMU", "THCU", "LABBARB"  Alcohol Level No results found for: "ETH"  CT Head without contrast(Personally reviewed): Age indeterminate but likely acute R corona radiata stroke.  CT angio Head and Neck with contrast: pending  MRI Brain: pending  Impression   Mike [name] is a 66 y.o. male with PMH significant for HTN, DM2, HLD, kidney stones, CAD who presents with L sided weakness. His neurologic examination is notable for L facial droop and LUE drift and mild weakness. Symptoms concerning for a lacunar stroke. Noted to have age indeterminate but likely acute R corona radiata stroke.  Primary Diagnosis:  Other cerebral infarction due to occlusion of stenosis of small artery.  Secondary Diagnosis: Essential (primary) hypertension and Type 2 diabetes mellitus with hyperglycemia   Recommendations   - Frequent Neuro checks per stroke unit protocol - Recommend brain imaging with MRI Brain without contrast - Recommend obtaining TTE - Recommend obtaining Lipid panel with LDL - Please start statin if LDL > 70 - Recommend HbA1c - Antithrombotic - Aspirin 81mg  daily along with plavix 75mg  daily x 21days, followed by Aspirin 81mg  daily alone - Recommend DVT ppx - SBP goal - permissive hypertension first 24 h < 220/110. Held home meds.  - Recommend Telemetry monitoring for arrythmia - Recommend bedside swallow screen prior to PO intake. - Stroke education  booklet - Recommend PT/OT/SLP consult  ______________________________________________________________________   Thank you for the opportunity to take part in the care of this patient. If you have any further questions, please contact the neurology consultation attending.  Signed,  [facility] Pager Number _ _ _   _ __   _ __ _ _  __ __   _ __   __ _

## 2022-06-15 NOTE — Assessment & Plan Note (Deleted)
History of MI in 2014. Patient reports chest pain that started yesterday. -Repeat EKG -Troponin pending -CXR pending

## 2022-06-15 NOTE — Hospital Course (Addendum)
Mike [name] is a 66 y.o.male with a history of HTN, HLD, T2DM who was admitted to the [facility] at [facility] for left-sided weakness and facial droop 2/2 right-sided lacunar stroke. His hospital course is detailed below:  Cerebrovascular accident Patient came to the hospital with a 1 day history of left-sided weakness, left facial droop, and dysarthria on presentation.  Fine motor movements on the left side were also diminished.  CT and MRI showed lacunar infarct in the right corona radiata and lentiform with no hemorrhage.  Neurology is following and recommends DAPT for three weeks.   Chest pain Patient presents to the hospital with chest pain.  Patient has a history of MI in 2014.  EKG shows sinus tachycardia. Troponins stable.  Other chronic conditions were medically managed with home medications and formulary alternatives as necessary (HTN, HLD, DM)  PCP Follow-up Recommendations: Direct LDL Ensure patient taking aspirin 325 mg and plavix 75 mg daily for 3 months and then just aspirin after.  Please ensure patient follows up with neurology outpatient.  Ensure patient maintains follow up with hypertension.  A1c 10.4, started on DM regimen in the hospital, reassess and adjust regimen as appropriate. Likely will need insulin as well, initiate as appropriate.  Ejection fraction found to be low at 30-35%. Follow-up to see if medication management is appropriate for this patient.

## 2022-06-15 NOTE — ED Triage Notes (Addendum)
Patient here after falling last night and this morning, feeling weak to the left arm that started last night.  Patient in triage with L sided facial droop, slurring of speech to which his family member noticed 30 minutes ago.  Patient is not on a blood thinner.

## 2022-06-16 DIAGNOSIS — E1159 Type 2 diabetes mellitus with other circulatory complications: Secondary | ICD-10-CM

## 2022-06-16 DIAGNOSIS — I63431 Cerebral infarction due to embolism of right posterior cerebral artery: Secondary | ICD-10-CM

## 2022-06-16 DIAGNOSIS — E1165 Type 2 diabetes mellitus with hyperglycemia: Secondary | ICD-10-CM

## 2022-06-16 DIAGNOSIS — E1169 Type 2 diabetes mellitus with other specified complication: Secondary | ICD-10-CM | POA: Insufficient documentation

## 2022-06-16 DIAGNOSIS — E785 Hyperlipidemia, unspecified: Secondary | ICD-10-CM

## 2022-06-16 DIAGNOSIS — I152 Hypertension secondary to endocrine disorders: Secondary | ICD-10-CM

## 2022-06-16 LAB — COMPREHENSIVE METABOLIC PANEL
ALT: 25 U/L (ref 0–44)
AST: 21 U/L (ref 15–41)
Albumin: 3.5 g/dL (ref 3.5–5.0)
Alkaline Phosphatase: 98 U/L (ref 38–126)
Anion gap: 9 (ref 5–15)
BUN: 14 mg/dL (ref 8–23)
CO2: 24 mmol/L (ref 22–32)
Calcium: 8.9 mg/dL (ref 8.9–10.3)
Chloride: 105 mmol/L (ref 98–111)
Creatinine, Ser: 0.83 mg/dL (ref 0.61–1.24)
GFR, Estimated: >60 mL/min (ref >60)
Glucose, Bld: 243 mg/dL — ABNORMAL HIGH (ref 70–99)
Potassium: 3.6 mmol/L (ref 3.5–5.1)
Sodium: 138 mmol/L (ref 135–145)
Total Bilirubin: 0.5 mg/dL (ref 0.3–1.2)
Total Protein: 6.7 g/dL (ref 6.5–8.1)

## 2022-06-16 LAB — CBC
HCT: 45.7 % (ref 39.0–52.0)
Hemoglobin: 15.4 g/dL (ref 13.0–17.0)
MCH: 29.9 pg (ref 26.0–34.0)
MCHC: 33.7 g/dL (ref 30.0–36.0)
MCV: 88.7 fL (ref 80.0–100.0)
Platelets: 244 10*3/uL (ref 150–400)
RBC: 5.15 MIL/uL (ref 4.22–5.81)
RDW: 13.4 % (ref 11.5–15.5)
WBC: 8.1 10*3/uL (ref 4.0–10.5)
nRBC: 0 % (ref 0.0–0.2)

## 2022-06-16 LAB — MAGNESIUM: Magnesium: 2.1 mg/dL (ref 1.7–2.4)

## 2022-06-16 LAB — LDL CHOLESTEROL, DIRECT: Direct LDL: 218 mg/dL — ABNORMAL HIGH (ref 0–99)

## 2022-06-16 LAB — HEMOGLOBIN A1C
Hgb A1c MFr Bld: 10.4 % — ABNORMAL HIGH (ref 4.8–5.6)
Mean Plasma Glucose: 251.78 mg/dL

## 2022-06-16 LAB — GLUCOSE, CAPILLARY: Glucose-Capillary: 318 mg/dL — ABNORMAL HIGH (ref 70–99)

## 2022-06-16 LAB — CBG MONITORING, ED
Glucose-Capillary: 269 mg/dL — ABNORMAL HIGH (ref 70–99)
Glucose-Capillary: 212 mg/dL — ABNORMAL HIGH (ref 70–99)

## 2022-06-16 LAB — VITAMIN B12: Vitamin B-12: 218 pg/mL (ref 180–914)

## 2022-06-16 LAB — TROPONIN I (HIGH SENSITIVITY): Troponin I (High Sensitivity): 34 ng/L — ABNORMAL HIGH (ref <18)

## 2022-06-16 MED ORDER — ALUM & MAG HYDROXIDE-SIMETH 200-200-20 MG/5ML PO SUSP
30.0000 mL | ORAL | Status: DC | PRN
  Administered 2022-06-16: 30 mL via ORAL
  Filled 2022-06-16: qty 30

## 2022-06-16 MED ORDER — METFORMIN HCL ER 500 MG PO TB24: 500.0000 mg | ORAL_TABLET | Freq: Every day | ORAL | Status: DC

## 2022-06-16 MED ORDER — INSULIN ASPART 100 UNIT/ML IJ SOLN
0.0000 [IU] | Freq: Three times a day (TID) | INTRAMUSCULAR | Status: DC
  Administered 2022-06-16: 3 [IU] via SUBCUTANEOUS
  Administered 2022-06-16: 5 [IU] via SUBCUTANEOUS
  Administered 2022-06-17: 9 [IU] via SUBCUTANEOUS
  Administered 2022-06-17: 3 [IU] via SUBCUTANEOUS

## 2022-06-16 MED ORDER — ASPIRIN 325 MG PO TBEC
325.0000 mg | DELAYED_RELEASE_TABLET | Freq: Every day | ORAL | Status: DC
  Administered 2022-06-16 – 2022-06-17 (×2): 325 mg via ORAL
  Filled 2022-06-16 (×2): qty 1

## 2022-06-16 MED ORDER — ATORVASTATIN CALCIUM 80 MG PO TABS
80.0000 mg | ORAL_TABLET | Freq: Every day | ORAL | Status: DC
  Administered 2022-06-17: 80 mg via ORAL
  Filled 2022-06-16: qty 1

## 2022-06-16 MED ORDER — INSULIN GLARGINE-YFGN 100 UNIT/ML ~~LOC~~ SOLN
5.0000 [IU] | Freq: Every day | SUBCUTANEOUS | Status: DC
  Administered 2022-06-16 – 2022-06-17 (×2): 5 [IU] via SUBCUTANEOUS
  Filled 2022-06-16 (×2): qty 0.05

## 2022-06-16 NOTE — Evaluation (Signed)
Occupational Therapy Evaluation Patient Details Name: Mike [name] MRN: [id number] DOB: 27-Dec-1955 Today's Date: 06/16/2022   History of Present Illness Pt is a 66 y.o. male who presented 06/15/22 with L-sided weakness. Outside window for tNKASE. MRI revealed acute lacunar infarct in the right corona radiata and lentiform. PMH: blood disorder, diabetes, HTN, high cholesterol   Clinical Impression   Mike [name] was evaluated s/p the above admission list, he reports being indep at baseline. Upon evaluation pt had functional limitations due to L hemi body sensory motor deficits, slurred speech, limited insight, weakness, decreased balance and activity tolerance. Overall he required min A +2 for mobility and up to mod A for LB ADLs. Pt noted to have some L inattention throughout session. OT to continue to follow acutely. Recommend d/c to OP OT.    Recommendations for follow up therapy are one component of a multi-disciplinary discharge planning process, led by the attending physician.  Recommendations may be updated based on patient status, additional functional criteria and insurance authorization.   Follow Up Recommendations  Outpatient OT    Assistance Recommended at Discharge Frequent or constant Supervision/Assistance  Patient can return home with the following A little help with bathing/dressing/bathroom;A little help with walking and/or transfers;Help with stairs or ramp for entrance;Assist for transportation;Assistance with cooking/housework    Functional Status Assessment  Patient has had a recent decline in their functional status and demonstrates the ability to make significant improvements in function in a reasonable and predictable amount of time.  Equipment Recommendations  Tub/shower seat    Recommendations for Other Services       Precautions / Restrictions Precautions Precautions: Fall Restrictions Weight Bearing Restrictions: No      Mobility Bed Mobility Overal bed  mobility: Needs Assistance Bed Mobility: Supine to Sit, Sit to Supine     Supine to sit: Min guard, HOB elevated Sit to supine: Min assist, HOB elevated        Transfers Overall transfer level: Needs assistance Equipment used: None Transfers: Sit to/from Stand Sit to Stand: Min assist, +2 safety/equipment           General transfer comment: MinA to steady pt when coming to stand from edge of stretcher.      Balance Overall balance assessment: Needs assistance Sitting-balance support: No upper extremity supported, Feet supported Sitting balance-Leahy Scale: Fair Sitting balance - Comments: difficulty reaching feet to donn socks sitting EOB   Standing balance support: Bilateral upper extremity supported, No upper extremity supported, During functional activity Standing balance-Leahy Scale: Fair                             ADL either performed or assessed with clinical judgement   ADL Overall ADL's : Needs assistance/impaired Eating/Feeding: Independent;Sitting   Grooming: Min guard;Standing Grooming Details (indicate cue type and reason): at the sink Upper Body Bathing: Set up;Sitting   Lower Body Bathing: Min guard;Sit to/from stand   Upper Body Dressing : Set up;Sitting   Lower Body Dressing: Moderate assistance;Sit to/from stand   Toilet Transfer: Minimal assistance;Ambulation;Rolling walker (2 wheels)   Toileting- Clothing Manipulation and Hygiene: Supervision/safety;Sitting/lateral lean       Functional mobility during ADLs: Min guard;Minimal assistance;Rolling walker (2 wheels) General ADL Comments: min A upon standing for LOB, progressed to min G     Vision Baseline Vision/History: 1 Wears glasses Vision Assessment?: Vision impaired- to be further tested in functional context Additional Comments: difficult to fully assess. some  L inattention noted.     Perception     Praxis      Pertinent Vitals/Pain Pain Assessment Pain  Assessment: No/denies pain     Hand Dominance  (per pt: ambidextrous)   Extremity/Trunk Assessment Upper Extremity Assessment Upper Extremity Assessment: RUE deficits/detail;LUE deficits/detail RUE Deficits / Details: 5/5, MMT and ROM WFL LUE Deficits / Details: 4/5 globally, denies paraesthesias. Full ROM. slowed and deliberate sensory motor coordination LUE Sensation: decreased light touch   Lower Extremity Assessment Lower Extremity Assessment: Defer to PT evaluation LLE Deficits / Details: incoordination noted; MMT scores of 4+ to 5 grossly throughout compared to 5 on R throughout; denied numbness/tingling currently, but reports moments of a sting/tingle randomly in various spots throughout entire body LLE Sensation: WNL LLE Coordination: decreased gross motor;decreased fine motor   Cervical / Trunk Assessment Cervical / Trunk Assessment: Normal   Communication Communication Communication: Prefers language other than English;Other (comment) (slurred speech. L facial droop)   Cognition Arousal/Alertness: Awake/alert Behavior During Therapy: WFL for tasks assessed/performed Overall Cognitive Status: Difficult to assess                                 General Comments: Appears to have some L inattention, needing cues for L hand placement on RW and pt bumped into doorframe on L 1x. Difficult to formally assess cognition due to language barrier, but A&Ox4. Limited insight to deficits     General Comments  elevated BP    Exercises     Shoulder Instructions      Home Living Family/patient expects to be discharged to:: Private residence Living Arrangements: Alone Available Help at Discharge: Family Type of Home: Apartment Home Access: Stairs to enter Secretary/administrator of Steps: 2 Entrance Stairs-Rails: None Home Layout: One level     Bathroom Shower/Tub: Chief Strategy Officer: Standard     Home Equipment: None   Additional Comments:  info above is for pt's home, but confirmed with his brother and sister-in-law that he could stay with them at d/c, unsure of their home set-up      Prior Functioning/Environment Prior Level of Function : Independent/Modified Independent             Mobility Comments: No AD ADLs Comments: Has brother provide transportation or takes bus or uber.        OT Problem List: Decreased strength;Decreased range of motion;Decreased activity tolerance;Impaired balance (sitting and/or standing);Decreased safety awareness;Decreased knowledge of precautions;Decreased knowledge of use of DME or AE      OT Treatment/Interventions: Self-care/ADL training;Therapeutic exercise;Neuromuscular education;DME and/or AE instruction;Therapeutic activities;Balance training;Patient/family education    OT Goals(Current goals can be found in the care plan section) Acute Rehab OT Goals Patient Stated Goal: home OT Goal Formulation: With patient Time For Goal Achievement: 06/30/22 Potential to Achieve Goals: Good ADL Goals Pt Will Perform Lower Body Dressing: with supervision;sit to/from stand Pt Will Transfer to Toilet: Independently;ambulating Additional ADL Goal #1: pt will indep complete medication management IADL task  OT Frequency: Min 2X/week    Co-evaluation PT/OT/SLP Co-Evaluation/Treatment: Yes Reason for Co-Treatment: Complexity of the patient's impairments (multi-system involvement);For patient/therapist safety;To address functional/ADL transfers PT goals addressed during session: Mobility/safety with mobility;Balance;Proper use of DME OT goals addressed during session: ADL's and self-care      AM-PAC OT "6 Clicks" Daily Activity     Outcome Measure Help from another person eating meals?: None Help from another person  taking care of personal grooming?: A Little Help from another person toileting, which includes using toliet, bedpan, or urinal?: A Little Help from another person bathing  (including washing, rinsing, drying)?: A Little Help from another person to put on and taking off regular upper body clothing?: None Help from another person to put on and taking off regular lower body clothing?: A Lot 6 Click Score: 19   End of Session Equipment Utilized During Treatment: Gait belt;Rolling walker (2 wheels) Nurse Communication: Mobility status  Activity Tolerance: Patient tolerated treatment well Patient left: in bed;with call bell/phone within reach  OT Visit Diagnosis: Unsteadiness on feet (R26.81);Other abnormalities of gait and mobility (R26.89);Repeated falls (R29.6);Muscle weakness (generalized) (M62.81);Hemiplegia and hemiparesis;Pain Hemiplegia - Right/Left: Left Hemiplegia - caused by: Cerebral infarction                Time: 8563-1497 OT Time Calculation (min): 21 min Charges:  OT General Charges $OT Visit: 1 Visit OT Evaluation $OT Eval Moderate Complexity: 1 Mod    Kenith Trickel D Causey 06/16/2022, 9:55 AM

## 2022-06-16 NOTE — Progress Notes (Addendum)
STROKE TEAM PROGRESS NOTE   ATTENDING NOTE: I reviewed above note and agree with the assessment and plan. Pt was seen and examined.   66 year old male with history of hypertension, hyperlipidemia, diabetes, CAD admitted for left-sided weakness, imbalance, left facial droop, fell at home x2.  CT showed right CR/BG infarct.  CT head and neck right M1 severe stenosis, right M2 near occlusion.  Left P2/P3 moderate stenosis.  MRI confirmed right BG/CR infarct, 2D echo pending.  LDL 218, A1c 10.4, UDS negative.  Creatinine 0.83.  On exam, patient lying in bed, AAOx3, no aphasia but dysarthria.  Left facial and left arm decree sensation but otherwise neurologically intact.  Etiology for patient stroke likely due to right MCA stenosis, large vessel disease.  Put on aspirin 325 and Plavix 75 DAPT for 3 months and then aspirin alone given intracranial stenosis.  Continue Lipitor 80.  Aggressive risk factor modification including BP, glucose and cholesterol.  PT/OT recommend outpatient therapy.  For detailed assessment and plan, please refer to above/below as I have made changes wherever appropriate.   Neurology will sign off. Please call with questions. Pt will follow up with stroke clinic NP at [facility] in about 4 weeks. Thanks for the consult.   Marvel Plan, MD PhD Stroke Neurology 06/16/2022 11:20 PM     INTERVAL HISTORY No family at the bedside. He is sitting up in bed in NAD. He is alert and oriented. Left facial droop and dysarthria. Left arm and leg 4/5. He states still has some weakness but can use his hand  MRI with Acute lacunar infarct in the Right corona radiata and lentiform   Vitals:   06/16/22 0130 06/16/22 0411 06/16/22 0616 06/16/22 0800  BP: (!) 144/121 (!) 159/102 (!) 166/94 (!) 171/99  Pulse: 91 85 78 71  Resp: (!) 27 17 15 12   Temp:   97.7 F (36.5 C)   TempSrc:   Oral   SpO2: 95% 98% 96% 98%   CBC:  Recent Labs  Lab 06/15/22 1056 06/15/22 1103 06/16/22 0300  WBC 7.8   --  8.1  NEUTROABS 5.7  --   --   HGB 16.0 17.0 15.4  HCT 46.2 50.0 45.7  MCV 88.2  --  88.7  PLT 234  --  244   Basic Metabolic Panel:  Recent Labs  Lab 06/15/22 1056 06/15/22 1103 06/16/22 0300  NA 136 137 138  K 4.0 4.3 3.6  CL 97* 98 105  CO2 24  --  24  GLUCOSE 307* 306* 243*  BUN 13 16 14   CREATININE 0.82 0.70 0.83  CALCIUM 9.4  --  8.9  MG  --   --  2.1   Lipid Panel:  Recent Labs  Lab 06/15/22 1500  CHOL 277*  TRIG 135  HDL 31*  CHOLHDL 8.9  VLDL 27  LDLCALC NOT CALCULATED   HgbA1c:  Recent Labs  Lab 06/16/22 0303  HGBA1C 10.4*   Urine Drug Screen:  Recent Labs  Lab 06/15/22 1016  LABOPIA NONE DETECTED  COCAINSCRNUR NONE DETECTED  LABBENZ NONE DETECTED  AMPHETMU NONE DETECTED  THCU NONE DETECTED  LABBARB NONE DETECTED    Alcohol Level  Recent Labs  Lab 06/15/22 1056  ETH <10    IMAGING past 24 hours DG CHEST PORT 1 VIEW  Result Date: 06/15/2022 CLINICAL DATA:  Dyspnea, left arm weakness EXAM: PORTABLE CHEST 1 VIEW COMPARISON:  None Available. FINDINGS: Mild cardiomegaly. Both lungs are clear. The visualized skeletal structures are unremarkable.  IMPRESSION: Mild cardiomegaly without acute abnormality of the lungs in AP portable projection. Electronically Signed   By: Jearld Lesch M.D.   On: 06/15/2022 15:31   MR BRAIN WO CONTRAST  Result Date: 06/15/2022 CLINICAL DATA:  66 year old male code stroke presentation with age indeterminate right hemisphere lacunar infarct, abnormal CTA suspicious for intracranial atherosclerosis versus thromboembolic disease. EXAM: MRI HEAD WITHOUT CONTRAST TECHNIQUE: Multiplanar, multiecho pulse sequences of the brain and surrounding structures were obtained without intravenous contrast. COMPARISON:  CT head and CTA head and neck earlier today. FINDINGS: Brain: Confluent diffusion restriction in the right corona radiata and tracking to the right lentiform corresponding to the earlier plain CT finding. Associated  T2 and FLAIR hyperintensity. No hemorrhage or mass effect. No other restricted diffusion. No midline shift, mass effect, evidence of mass lesion, ventriculomegaly, extra-axial collection or acute intracranial hemorrhage. Cervicomedullary junction and pituitary are within normal limits. Additional mild scattered bilateral, mostly subcortical, white matter T2 and FLAIR hyperintensity. No cortical encephalomalacia or chronic cerebral blood products identified. Left basal ganglia, bilateral thalami, brainstem and cerebellum appear negative. Vascular: Major intracranial vascular flow voids are preserved. Skull and upper cervical spine: Negative. Visualized bone marrow signal is within normal limits. Sinuses/Orbits: Rightward gaze now. Paranasal sinuses remain well aerated. Other: Mastoids are clear.  Negative visible scalp and face. IMPRESSION: 1. Acute lacunar infarct in the Right corona radiata and lentiform corresponding to the earlier plain CT finding. No associated hemorrhage or mass effect. Preliminary report of this was discussed by telephone with Dr. Derry Lory on 06/15/2022 at 1215 hours. 2. Underlying mild for age nonspecific cerebral white matter signal changes. Electronically Signed   By: Odessa Fleming M.D.   On: 06/15/2022 13:03   CT ANGIO HEAD NECK W WO CM  Result Date: 06/15/2022 CLINICAL DATA:  Code stroke. 66 year old male with facial droop and weakness last known well 2300 hours. EXAM: CT ANGIOGRAPHY HEAD AND NECK TECHNIQUE: Multidetector CT imaging of the head and neck was performed using the standard protocol during bolus administration of intravenous contrast. Multiplanar CT image reconstructions and MIPs were obtained to evaluate the vascular anatomy. Carotid stenosis measurements (when applicable) are obtained utilizing NASCET criteria, using the distal internal carotid diameter as the denominator. RADIATION DOSE REDUCTION: This exam was performed according to the departmental dose-optimization  program which includes automated exposure control, adjustment of the mA and/or kV according to patient size and/or use of iterative reconstruction technique. CONTRAST:  52mL OMNIPAQUE IOHEXOL 350 MG/ML SOLN COMPARISON:  Plain head CT 1104 hours today. FINDINGS: CTA NECK Skeleton: Carious posterior dentition on the left. Mild for age cervical spine degeneration. No acute osseous abnormality identified. Upper chest: Negative. Other neck: Negative. Aortic arch: 3 vessel arch configuration. Soft more so than calcified arch atherosclerosis. Right carotid system: Brachiocephalic and proximal right CCA soft plaque without stenosis. Intermittent soft plaque before the bifurcation. Mild to moderate soft plaque at the bifurcation and faint calcified right ICA bulb plaque without stenosis. Left carotid system: Similar mostly soft plaque with no significant stenosis. Vertebral arteries: Mild proximal right subclavian artery plaque without stenosis. Moderate soft plaque at the right vertebral artery origin with moderate stenosis on series 8, image 137. Right vertebral otherwise negative to the skull base. Left subclavian artery bulky soft plaque resulting in estimated 56% stenosis (series 7, image 291) but only mildly affecting the left vertebral artery origin on series 8, image 140. No significant origin stenosis. Mildly dominant left vertebral otherwise negative to the skull base. CTA HEAD Posterior  circulation: Mildly dominant left vertebral V4 segment. No significant distal vertebral plaque or stenosis. Patent PICA origins. Patent and mildly irregular basilar artery with no significant stenosis. Patent SCA and left PCA origins. Fetal type right PCA origin. Left posterior communicating artery diminutive or absent. Right PCA branches are within normal limits. But there is up to moderate asymmetric irregularity and attenuation of the left PCA distal P2 and P3 segments best demonstrated on series 12, image 21. Anterior  circulation: Both ICA siphons are patent, and the left appears dominant owing to dominant left and diminutive or absent right ACA A1 segments. No significant ICA siphon plaque or stenosis. Normal right posterior communicating artery origin. Patent carotid termini MCA origins. Anterior communicating artery and bilateral ACA branches are within normal limits. Left MCA M1 segment and bifurcation appear patent without stenosis. No left MCA branch occlusion identified. Right MCA M1 segment is patent although mildly to moderate Leigh irregular and stenotic just proximal to the bifurcation. And at the bifurcation there is suspicion of a short 3 mm area of severe stenosis or near occlusion at the anterior M2 division. See series 10, image 21. Moderate irregularity and stenosis also at the other M2 division. But no complete MCA branch occlusion. Venous sinuses: Early contrast timing, grossly patent. Anatomic variants: Mildly dominant left vertebral artery. Fetal type right PCA origin. Dominant left and diminutive or absent right ACA A1 segments. Review of the MIP images confirms the above findings IMPRESSION: 1. No large vessel occlusion, but positive for multifocal intracranial irregularity and stenosis: - distal Right MCA M1 and bifurcation, with near occlusion of the anterior M2 division. - Moderate stenosis of the Left PCA distal P2 and P3 segments. This could be thromboembolic disease rather than intracranial atherosclerosis (see #2). 2. Extracranially there is abundant soft plaque in both cervical carotid arteries. No significant carotid stenosis but Moderate stenosis of the Right Vertebral Artery origin and 56% stenosis of proximal the Left subclavian artery both due to bulky soft plaque. Electronically Signed   By: Odessa Fleming M.D.   On: 06/15/2022 11:41   CT HEAD CODE STROKE WO CONTRAST  Result Date: 06/15/2022 CLINICAL DATA:  Code stroke. 66 year old male with facial droop and weakness last known well 2300 hours.  EXAM: CT HEAD WITHOUT CONTRAST TECHNIQUE: Contiguous axial images were obtained from the base of the skull through the vertex without intravenous contrast. RADIATION DOSE REDUCTION: This exam was performed according to the departmental dose-optimization program which includes automated exposure control, adjustment of the mA and/or kV according to patient size and/or use of iterative reconstruction technique. COMPARISON:  None Available. FINDINGS: Brain: Asymmetric confluent hypodensity in the right corona radiata tracking toward the right lentiform on series 5 image 31. There seems to be minor associated mass effect on the nearby right lateral ventricle. No midline shift. No ventriculomegaly. No acute intracranial hemorrhage identified. Elsewhere gray-white differentiation is within normal limits throughout the brain. No cortically based acute infarct identified. Vascular: Faint Calcified atherosclerosis at the skull base. No suspicious intracranial vascular hyperdensity. Skull: No acute osseous abnormality identified. Sinuses/Orbits: Tympanic cavities, Visualized paranasal sinuses and mastoids are clear. Other: Visualized scalp soft tissues are within normal limits. Orbits appear within normal limits, no gaze deviation. ASPECTS [facility] Stroke Program Early CT Score) Total score (0-10 with 10 being normal): 10; white matter hypodensity in the right hemisphere but no cortical cytotoxic edema identified. IMPRESSION: 1. Age indeterminate lacunar infarct of the Right corona radiata and lentiform. No associated hemorrhage or significant mass  effect. Query left side symptoms. 2. Otherwise negative for age, ASPECTS 10. 3. These results were communicated to Dr. Derry Lory at 11:13 am on 06/15/2022 by text page via the [facility] messaging system. Electronically Signed   By: Odessa Fleming M.D.   On: 06/15/2022 11:13    PHYSICAL EXAM  Temp:  [97.7 F (36.5 C)-98.6 F (37 C)] 97.8 F (36.6 C) (11/12 1000) Pulse Rate:  [71-93] 89  (11/12 1000) Resp:  [11-27] 18 (11/12 1000) BP: (140-184)/(79-121) 184/115 (11/12 1000) SpO2:  [94 %-98 %] 94 % (11/12 1000)  General - Well nourished, well developed, in no apparent distress. Cardiovascular - Regular rhythm and rate.  Mental Status -  Level of arousal and orientation to time, place, and person were intact.Has dysarthria. No aphasia  Language including expression, naming, repetition, comprehension was assessed and found intact. Attention span and concentration were normal. Recent and remote memory were intact. Fund of Knowledge was assessed and was intact.  Cranial Nerves II - XII - II - Visual field intact OU. III, IV, VI - Extraocular movements intact. V - Facial sensation intact bilaterally. VII -Left facial droop  VIII - Hearing & vestibular intact bilaterally. X - Palate elevates symmetrically. XI - Chin turning & shoulder shrug intact bilaterally. XII - Tongue protrusion intact.  Motor Strength - Left arm 4/5 with mild drift. Right arm and leg 5/5 Motor Tone - Muscle tone was assessed at the neck and appendages and was normal.  Sensory - Light touch, temperature/pinprick were assessed and were symmetrical.    Coordination - The patient had normal movements in the hands and feet with no ataxia or dysmetria.  Tremor was absent.  Gait and Station - deferred.  ASSESSMENT/PLAN Mr. Marjorie Lussier is a 66 y.o. male with history of HTN, DM2, HLD, kidney stones, CAD who presents with L sided weakness.   Stroke: Acute lacunar infarct in the Right corona radiata and lentiform Etiology:  large vessel intracranial stenosis  Code Stroke CT head ge indeterminate lacunar infarct of the Right corona radiata and lentiform. No associated hemorrhage or significant mass effect. ASPECTS 10.    CTA head & neck  1. No large vessel occlusion, but positive for multifocal intracranial irregularity and stenosis: - distal Right MCA M1 and bifurcation, with near occlusion of the  anterior M2 division. - Moderate stenosis of the Left PCA distal P2 and P3 segments. This could be thromboembolic disease rather than intracranial atherosclerosis (see #2). 2. Extracranially there is abundant soft plaque in both cervical carotid arteries. No significant carotid stenosis but Moderate stenosis of the Right Vertebral Artery origin and 56% stenosis of proximal the Left subclavian artery both due to bulky soft plaque.   MRI  Acute lacunar infarct in the Right corona radiata and lentiform   2D Echo pending  LDL 218 HgbA1c 10.4 VTE prophylaxis - Lovenox    Diet   Diet Heart Room service appropriate? Yes; Fluid consistency: Thin   No antithrombotic prior to admission, now on aspirin 325 mg daily and clopidogrel 75 mg daily. For 3 months than ASA alone  Therapy recommendations:  pending Disposition:  pending  Hypertension Home meds:  none Stable Permissive hypertension (OK if < 220/120) but gradually normalize in 5-7 days Long-term BP goal normotensive  Hyperlipidemia Home meds:  none LDL 218, goal < 70 Add atorvastatin 218  Continue statin at discharge  Diabetes type II UnControlled Home meds:  none HgbA1c 10.4, goal < 7.0 CBGs Recent Labs    06/15/22  1056 06/16/22 0757  GLUCAP 282* 269*    SSI DM education  Will need close follow up with PCP for DM   Other Stroke Risk Factors Obesity, There is no height or weight on file to calculate BMI., BMI >/= 30 associated with increased stroke risk, recommend weight loss, diet and exercise as appropriate  Coronary artery disease   Hospital day # 0  Gevena Martenise Wolfe DNP, ACNPC-AG   To contact Stroke Continuity provider, please refer to [email or web address]. After hours, contact General Neurology

## 2022-06-16 NOTE — Progress Notes (Signed)
     Daily Progress Note Intern Pager: [phone]  Patient name: Mike [name] Medical record number: [id number] Date of birth: [birth year] Age: 66 y.o. Gender: male  Primary Care Provider: Patient, No Pcp Per Consultants: Neurology Code Status: Full  Pt Overview and Major Events to Date:  11/11: Admitted  Assessment and Plan: Mike [name] is a 66 y.o. male presenting with right lacunar infarct. Pertinent PMH/PSH includes HTN, HLD, T2DM.  * Stroke [facility]) CT and MRI shows lacunar infarct on the right side, residual left-sided weakness. This is likely due to uncontrolled HTN, HLD, and DM. -Neurology following, appreciate recs -Neurochecks every 4 hours -Pending echo -Aspirin 81 mg daily along with Plavix 75 mg daily x21 days, followed by aspirin 81 mg daily alone -Start atorvastatin 40 mg daily -PT/OT eval and treat, up with assistance  Hyperlipidemia associated with type 2 diabetes mellitus (HCC) Lipid panel with incalculable LDL, HDL 31.  Goal <70 in the setting of recent stroke.  Will require reassessment of LDL in the outpatient setting. -Start atorvastatin 40 mg daily  Diabetes (HCC) History of DM.  No active home medications. A1c 10.4.  Would benefit from SGLT2i but does not have insurance.  Would need to be addressed with pharmacy for financial assistance. -Start metformin XR 500mg , Semglee 5u daily -Start sSSI, CBG 4x daily, with meals and qhs  HTN (hypertension) SBP ranging from 160s-170s. Currently not on any medications in the setting of CVA. -Permissive HTN for initial 24 hours -Continue to monitor BP   FEN/GI: Heart healthy PPx: Lovenox Dispo:Pending PT recommendations .   Subjective:  States he is doing well, he got some sleep.  No concerns or questions voiced.  Objective: Temp:  [97.7 F (36.5 C)-98.6 F (37 C)] 97.7 F (36.5 C) (11/12 0616) Pulse Rate:  [78-113] 78 (11/12 0616) Resp:  [11-27] 15 (11/12 0616) BP: (140-228)/(79-132) 166/94 (11/12  0616) SpO2:  [94 %-99 %] 96 % (11/12 0616) Physical Exam: General: Awake, alert, NAD Cardiovascular: RRR, no murmurs auscultated Respiratory: CTA B, normal WOB Neuro: 4/5 upper extremity and grip strength on left side, 4/5 lower extremity strength on left side  Laboratory: Most recent CBC Lab Results  Component Value Date   WBC 8.1 06/16/2022   HGB 15.4 06/16/2022   HCT 45.7 06/16/2022   MCV 88.7 06/16/2022   PLT 244 06/16/2022   Most recent BMP    Latest Ref Rng & Units 06/16/2022    3:00 AM  BMP  Glucose 70 - 99 mg/dL 13/07/2022   BUN 8 - 23 mg/dL 14   Creatinine 790 - 1.24 mg/dL 2.40   Sodium 9.73 - 532 mmol/L 138   Potassium 3.5 - 5.1 mmol/L 3.6   Chloride 98 - 111 mmol/L 105   CO2 22 - 32 mmol/L 24   Calcium 8.9 - 10.3 mg/dL 8.9    Lipid Panel     Component Value Date/Time   CHOL 277 (H) 06/15/2022 1500   TRIG 135 06/15/2022 1500   HDL 31 (L) 06/15/2022 1500   CHOLHDL 8.9 06/15/2022 1500   VLDL 27 06/15/2022 1500   LDLCALC NOT CALCULATED 06/15/2022 1500    TSH: 1.535 Troponin: 28>34>34  Imaging/Diagnostic Tests: None 13/06/2022, DO 06/16/2022, 6:58 AM  PGY-2, [facility] FPTS Intern pager: [phone], text pages welcome Secure chat group [facility] Teaching Service

## 2022-06-16 NOTE — ED Notes (Signed)
Pt reported mild feeling of indegestion. RN placed order for Maalox under General Admission PRN order set.

## 2022-06-16 NOTE — Evaluation (Signed)
Physical Therapy Evaluation Patient Details Name: Mike [name] MRN: [id number] DOB: 12-14-1955 Today's Date: 06/16/2022  History of Present Illness  Pt is a 66 y.o. male who presented 06/15/22 with L-sided weakness. Outside window for tNKASE. MRI revealed acute lacunar infarct in the right corona radiata and lentiform. PMH: blood disorder, diabetes, HTN, high cholesterol   Clinical Impression  Pt presents with condition above and deficits mentioned below, see PT Problem List. PTA, he was independent without DME, living alone in a 1-level apartment with 2 STE. Currently, pt is demonstrating some mild L lower extremity weakness compared to his R with MMT, but displays more functional weakness on his L when ambulating as he would have poor L foot clearance and heel strike. Pt also displays L lower extremity coordination deficits alone with some potential L-sided inattention, needing cues for placing his L hand appropriately on the RW and pt having bumped into the L side of the doorway 1x. He remains at risk for falls with noted balance deficits, requiring up to minA for all functional mobility. He did demonstrate improved stability, requiring min guard assist the majority of the time when using a RW though. Called pt's brother and sister-in-law, who report they could have pt live with them upon d/c. Educated them on pt's risk for falls and need for assistance. They verbalized understanding, but question full comprehension due to language barrier. Provided pt has the level of assistance needed at d/c, recommending follow-up with OPPT at a neuro clinic. Will continue to follow acutely.       Recommendations for follow up therapy are one component of a multi-disciplinary discharge planning process, led by the attending physician.  Recommendations may be updated based on patient status, additional functional criteria and insurance authorization.  Follow Up Recommendations Outpatient PT (at neuro clinic)       Assistance Recommended at Discharge Intermittent Supervision/Assistance  Patient can return home with the following  A little help with walking and/or transfers;A little help with bathing/dressing/bathroom;Assistance with cooking/housework;Direct supervision/assist for medications management;Direct supervision/assist for financial management;Assist for transportation;Help with stairs or ramp for entrance    Equipment Recommendations Rolling walker (2 wheels);Other (comment) (shower chair)  Recommendations for Other Services       Functional Status Assessment Patient has had a recent decline in their functional status and demonstrates the ability to make significant improvements in function in a reasonable and predictable amount of time.     Precautions / Restrictions Precautions Precautions: Fall Restrictions Weight Bearing Restrictions: No      Mobility  Bed Mobility Overal bed mobility: Needs Assistance Bed Mobility: Supine to Sit, Sit to Supine     Supine to sit: Min guard, HOB elevated Sit to supine: Min assist, HOB elevated   General bed mobility comments: Min guard assist for safety coming to sit edge of stretcher from elevated HOB. MinA to direct trunk safely to midline of stretcher with return to supine, cuing pt to line up body in midline.    Transfers Overall transfer level: Needs assistance Equipment used: None Transfers: Sit to/from Stand Sit to Stand: Min assist, +2 safety/equipment           General transfer comment: MinA to steady pt when coming to stand from edge of stretcher.    Ambulation/Gait Ambulation/Gait assistance: Min assist, +2 safety/equipment, Min guard Gait Distance (Feet): 200 Feet Assistive device: Rolling walker (2 wheels), None Gait Pattern/deviations: Step-through pattern, Decreased stride length, Decreased dorsiflexion - left Gait velocity: reduced Gait velocity interpretation: 1.31 -  2.62 ft/sec, indicative of limited  community ambulator   General Gait Details: Pt with noted L foot drag/poor foot clearance and heel strike intermittently, needing cues to correct with mod success. Pt needing min guard-minA for stability when ambulating without UE support, progressed to being more consistent with min guard assist when using RW. Needed cues to remain within RW and place L hand on grip of RW, good carryover noted.  Stairs            Wheelchair Mobility    Modified Rankin (Stroke Patients Only) Modified Rankin (Stroke Patients Only) Pre-Morbid Rankin Score: No symptoms Modified Rankin: Moderately severe disability     Balance Overall balance assessment: Needs assistance Sitting-balance support: No upper extremity supported, Feet supported Sitting balance-Leahy Scale: Fair Sitting balance - Comments: difficulty reaching feet to donn socks sitting EOB   Standing balance support: Bilateral upper extremity supported, No upper extremity supported, During functional activity Standing balance-Leahy Scale: Fair Standing balance comment: Able to stand statically without UE support, but needing up to minA to prevent LOB when trying to ambulate without UE support. Benefits from RW                             Pertinent Vitals/Pain Pain Assessment Pain Assessment: No/denies pain    Home Living Family/patient expects to be discharged to:: Private residence Living Arrangements: Alone Available Help at Discharge: Family (unsure of amount of assistance available, but brother and sister-in-law live nearby and said he could stay with them at d/c) Type of Home: Apartment Home Access: Stairs to enter Entrance Stairs-Rails: None Entrance Stairs-Number of Steps: 2   Home Layout: One level Home Equipment: None Additional Comments: info above is for pt's home, but confirmed with his brother and sister-in-law that he could stay with them at d/c, unsure of their home set-up    Prior Function Prior Level  of Function : Independent/Modified Independent             Mobility Comments: No AD ADLs Comments: Has brother provide transportation or takes bus or uber.     Hand Dominance   Dominant Hand:  (ambidextrous)    Extremity/Trunk Assessment   Upper Extremity Assessment Upper Extremity Assessment: Defer to OT evaluation    Lower Extremity Assessment Lower Extremity Assessment: LLE deficits/detail LLE Deficits / Details: incoordination noted; MMT scores of 4+ to 5 grossly throughout compared to 5 on R throughout; denied numbness/tingling currently, but reports moments of a sting/tingle randomly in various spots throughout entire body LLE Sensation: WNL LLE Coordination: decreased gross motor;decreased fine motor    Cervical / Trunk Assessment Cervical / Trunk Assessment: Normal  Communication   Communication: Prefers language other than English (but can speak and understand English fairly well)  Cognition Arousal/Alertness: Awake/alert Behavior During Therapy: WFL for tasks assessed/performed Overall Cognitive Status: Difficult to assess                                 General Comments: Appears to have some L inattention, needing cues for L hand placement on RW and pt bumped into doorframe on L 1x. Difficult to formally assess cognition due to language barrier, but A&Ox4.        General Comments General comments (skin integrity, edema, etc.): called brother and pt's sister-in-law answered and provided translation to brother- educated them on pt's risk for falls and need to  stay with family or have family stay with him to provide intermittent assistance/supervision until he hopefully improves and to guard pt when ambulating, on use of RW, to not let pt leave the house alone, to have pt sit when showering, and to assist pt in managing his meds and finances and lower body dressing. She verbalized understanding, but not quite sure if she fully understood as she would  repeated "ok, we will be at the hospital around noon"    Exercises     Assessment/Plan    PT Assessment Patient needs continued PT services  PT Problem List Decreased strength;Decreased activity tolerance;Decreased balance;Decreased mobility;Decreased coordination;Decreased knowledge of use of DME;Decreased cognition       PT Treatment Interventions DME instruction;Gait training;Stair training;Functional mobility training;Therapeutic activities;Therapeutic exercise;Balance training;Neuromuscular re-education;Cognitive remediation;Patient/family education    PT Goals (Current goals can be found in the Care Plan section)  Acute Rehab PT Goals Patient Stated Goal: to return to normal PT Goal Formulation: With patient/family Time For Goal Achievement: 06/30/22 Potential to Achieve Goals: Good    Frequency Min 4X/week     Co-evaluation PT/OT/SLP Co-Evaluation/Treatment: Yes Reason for Co-Treatment: For patient/therapist safety;To address functional/ADL transfers PT goals addressed during session: Mobility/safety with mobility;Balance;Proper use of DME         AM-PAC PT "6 Clicks" Mobility  Outcome Measure Help needed turning from your back to your side while in a flat bed without using bedrails?: A Little Help needed moving from lying on your back to sitting on the side of a flat bed without using bedrails?: A Little Help needed moving to and from a bed to a chair (including a wheelchair)?: A Little Help needed standing up from a chair using your arms (e.g., wheelchair or bedside chair)?: A Little Help needed to walk in hospital room?: A Little Help needed climbing 3-5 steps with a railing? : A Little 6 Click Score: 18    End of Session Equipment Utilized During Treatment: Gait belt Activity Tolerance: Patient tolerated treatment well Patient left: in bed;with call bell/phone within reach Nurse Communication: Mobility status PT Visit Diagnosis: Unsteadiness on feet  (R26.81);Other abnormalities of gait and mobility (R26.89);Muscle weakness (generalized) (M62.81);Difficulty in walking, not elsewhere classified (R26.2);Other symptoms and signs involving the nervous system (R29.898);Hemiplegia and hemiparesis;History of falling (Z91.81) Hemiplegia - Right/Left: Left Hemiplegia - dominant/non-dominant:  (ambidextrous) Hemiplegia - caused by: Cerebral infarction    Time: 5621-3086 PT Time Calculation (min) (ACUTE ONLY): 31 min   Charges:   PT Evaluation $PT Eval Moderate Complexity: 1 Mod          Raymond Gurney, PT, DPT Acute Rehabilitation Services  Office: [phone]   Jewel Baize 06/16/2022, 9:42 AM

## 2022-06-16 NOTE — Assessment & Plan Note (Addendum)
CT and MRI shows lacunar infarct on the right side, residual left-sided weakness. This is likely due to uncontrolled HTN, HLD, and DM. -Neurology following, appreciate recs -Neurochecks every 4 hours -Pending echo -Aspirin 81 mg daily along with Plavix 75 mg daily x21 days, followed by aspirin 81 mg daily alone -Start atorvastatin 40 mg daily -PT/OT eval and treat, up with assistance

## 2022-06-16 NOTE — Assessment & Plan Note (Addendum)
Lipid panel with incalculable LDL, HDL 31.  Goal <70 in the setting of recent stroke.  Will require reassessment of LDL in the outpatient setting. -Start atorvastatin 40 mg daily

## 2022-06-16 NOTE — ED Notes (Signed)
Family at bedside with pt. Updated on plan of care. Pt performing oral hygiene.

## 2022-06-16 NOTE — ED Notes (Signed)
Patient assisted to sit on side of bed.  Provided with urinal.  Instructed to stay in bed and not attempt to get out.   Understanding voiced

## 2022-06-16 NOTE — Progress Notes (Signed)
SLP Cancellation Note  Patient Details Name: Mike [name] MRN: [id number] DOB: 09/29/1955   Cancelled treatment:       Reason Eval/Treat Not Completed: SLP screened, no needs identified, will sign off (Pt passed [facility] and diet initiated. Defer formal evaluation. If cognitive-linguistic eval indicated, please order.)  Ferdinand Lango MA, CCC-SLP  Jeanpaul Biehl Meryl 06/16/2022, 10:32 AM

## 2022-06-17 ENCOUNTER — Other Ambulatory Visit (HOSPITAL_COMMUNITY): Payer: Self-pay

## 2022-06-17 ENCOUNTER — Other Ambulatory Visit: Payer: Self-pay | Admitting: Neurology

## 2022-06-17 ENCOUNTER — Observation Stay (HOSPITAL_BASED_OUTPATIENT_CLINIC_OR_DEPARTMENT_OTHER): Payer: Self-pay

## 2022-06-17 DIAGNOSIS — I10 Essential (primary) hypertension: Secondary | ICD-10-CM

## 2022-06-17 DIAGNOSIS — I5030 Unspecified diastolic (congestive) heart failure: Secondary | ICD-10-CM

## 2022-06-17 DIAGNOSIS — I5022 Chronic systolic (congestive) heart failure: Secondary | ICD-10-CM

## 2022-06-17 LAB — ECHOCARDIOGRAM COMPLETE BUBBLE STUDY
AR max vel: 2.38 cm2
AV Area VTI: 2.4 cm2
AV Area mean vel: 2.26 cm2
AV Mean grad: 2 mmHg
AV Peak grad: 4.1 mmHg
Ao pk vel: 1.01 m/s
Area-P 1/2: 3.31 cm2
Calc EF: 38.1 %
MV M vel: 2.18 m/s
MV Peak grad: 18.9 mmHg
P 1/2 time: 766 msec
S' Lateral: 4.1 cm
Single Plane A2C EF: 36.4 %
Single Plane A4C EF: 41.3 %

## 2022-06-17 LAB — GLUCOSE, CAPILLARY
Glucose-Capillary: 216 mg/dL — ABNORMAL HIGH (ref 70–99)
Glucose-Capillary: 389 mg/dL — ABNORMAL HIGH (ref 70–99)

## 2022-06-17 MED ORDER — LOSARTAN POTASSIUM 25 MG PO TABS
25.0000 mg | ORAL_TABLET | Freq: Every day | ORAL | Status: DC
  Administered 2022-06-17: 25 mg via ORAL
  Filled 2022-06-17: qty 1

## 2022-06-17 MED ORDER — CLOPIDOGREL BISULFATE 75 MG PO TABS
75.0000 mg | ORAL_TABLET | Freq: Every day | ORAL | 0 refills | Status: DC
  Filled 2022-06-17: qty 30, 30d supply, fill #0

## 2022-06-17 MED ORDER — ATORVASTATIN CALCIUM 80 MG PO TABS
80.0000 mg | ORAL_TABLET | Freq: Every day | ORAL | 1 refills | Status: DC
  Filled 2022-06-17: qty 30, 30d supply, fill #0

## 2022-06-17 MED ORDER — METFORMIN HCL ER 500 MG PO TB24
500.0000 mg | ORAL_TABLET | Freq: Every day | ORAL | 1 refills | Status: DC
  Filled 2022-06-17: qty 30, 30d supply, fill #0

## 2022-06-17 MED ORDER — LOSARTAN POTASSIUM 25 MG PO TABS
25.0000 mg | ORAL_TABLET | Freq: Every day | ORAL | 2 refills | Status: DC
  Filled 2022-06-17: qty 30, 30d supply, fill #0

## 2022-06-17 MED ORDER — ASPIRIN 325 MG PO TBEC
325.0000 mg | DELAYED_RELEASE_TABLET | Freq: Every day | ORAL | 1 refills | Status: DC
  Filled 2022-06-17: qty 30, 30d supply, fill #0

## 2022-06-17 MED ORDER — PERFLUTREN LIPID MICROSPHERE
1.0000 mL | INTRAVENOUS | Status: AC | PRN
  Administered 2022-06-17: 2 mL via INTRAVENOUS

## 2022-06-17 NOTE — Progress Notes (Signed)
Physical Therapy Treatment Patient Details Name: Mike [name] MRN: [id number] DOB: 02-Mar-1956 Today's Date: 06/17/2022   History of Present Illness Pt is a 66 y.o. male who presented 06/15/22 with L-sided weakness. Outside window for tNKASE. MRI revealed acute lacunar infarct in the right corona radiata and lentiform. PMH: blood disorder, diabetes, HTN, high cholesterol    PT Comments    Pt received supine, motivated for OOB mobility with continued progress towards acute goals. Pt continues to need cues to attend visually to L, as pt bumping RW into wet floor sign on L x1, good carryover with pt able self correct left drift from center of hall without cues. Pt needing prompting to attend to left to find room at end of session. Pt grossly min guard throughout session for mobility with RW, light min assist needed throughout stair training for cues for sequencing and safety. Pt continues to be limited by impaired balance and postural reactions and remains at risk for falls. Current plan remains appropriate to address deficits and maximize functional independence and safety. Pt continues to benefit from skilled PT services to progress toward functional mobility goals.    Recommendations for follow up therapy are one component of a multi-disciplinary discharge planning process, led by the attending physician.  Recommendations may be updated based on patient status, additional functional criteria and insurance authorization.  Follow Up Recommendations  Outpatient PT (at neuro clinic)     Assistance Recommended at Discharge Intermittent Supervision/Assistance  Patient can return home with the following A little help with walking and/or transfers;A little help with bathing/dressing/bathroom;Assistance with cooking/housework;Direct supervision/assist for medications management;Direct supervision/assist for financial management;Assist for transportation;Help with stairs or ramp for entrance   Equipment  Recommendations  Rolling walker (2 wheels);Other (comment) (shower chair)    Recommendations for Other Services       Precautions / Restrictions Precautions Precautions: Fall Restrictions Weight Bearing Restrictions: No     Mobility  Bed Mobility Overal bed mobility: Needs Assistance Bed Mobility: Supine to Sit     Supine to sit: Min guard, HOB elevated     General bed mobility comments: min guard for safety    Transfers Overall transfer level: Needs assistance Equipment used: Rolling walker (2 wheels) Transfers: Sit to/from Stand Sit to Stand: Min guard           General transfer comment: min guard for safety    Ambulation/Gait Ambulation/Gait assistance: Min assist, Min guard Gait Distance (Feet): 300 Feet Assistive device: Rolling walker (2 wheels), None Gait Pattern/deviations: Step-through pattern, Decreased stride length, Decreased dorsiflexion - left Gait velocity: reduced     General Gait Details: Needed cues to remain within RW,  to maintain center in hall and to attend to objects/people on left with good carryover noted.   Stairs Stairs: Yes Stairs assistance: Min guard, Min assist Stair Management: Two rails, Alternating pattern, Step to pattern, Forwards Number of Stairs: 8 (2 steps x4) General stair comments: cues for sequencing, pt with trials of aternating steps and step to pattern with noted increase in stability with step-to pattern   Wheelchair Mobility    Modified Rankin (Stroke Patients Only) Modified Rankin (Stroke Patients Only) Pre-Morbid Rankin Score: No symptoms Modified Rankin: Moderately severe disability     Balance Overall balance assessment: Needs assistance Sitting-balance support: No upper extremity supported, Feet supported Sitting balance-Leahy Scale: Fair Sitting balance - Comments: difficulty reaching feet to donn socks sitting EOB   Standing balance support: Bilateral upper extremity supported, No upper  extremity supported,  During functional activity Standing balance-Leahy Scale: Fair Standing balance comment: Able to stand statically without UE support, but needing up to minA to prevent LOB when trying to ambulate without UE support. Benefits from RW                            Cognition Arousal/Alertness: Awake/alert Behavior During Therapy: [facility] for tasks assessed/performed Overall Cognitive Status: Difficult to assess                                 General Comments: Appears to have some L inattention, needing cues for obstacles in hall on L and to maintain center of hall, pt able to self correct with task practice at end of session Difficult to formally assess cognition due to language barrier, but A&Ox4. Limited insight to deficits        Exercises      General Comments        Pertinent Vitals/Pain Pain Assessment Pain Assessment: No/denies pain    Home Living                          Prior Function            PT Goals (current goals can now be found in the care plan section) Acute Rehab PT Goals PT Goal Formulation: With patient/family Time For Goal Achievement: 06/30/22    Frequency    Min 4X/week      PT Plan      Co-evaluation              AM-PAC PT "6 Clicks" Mobility   Outcome Measure  Help needed turning from your back to your side while in a flat bed without using bedrails?: A Little Help needed moving from lying on your back to sitting on the side of a flat bed without using bedrails?: A Little Help needed moving to and from a bed to a chair (including a wheelchair)?: A Little Help needed standing up from a chair using your arms (e.g., wheelchair or bedside chair)?: A Little Help needed to walk in hospital room?: A Little Help needed climbing 3-5 steps with a railing? : A Little 6 Click Score: 18    End of Session Equipment Utilized During Treatment: Gait belt Activity Tolerance: Patient tolerated  treatment well Patient left: with call bell/phone within reach;in chair;with chair alarm set;with family/visitor present Nurse Communication: Mobility status PT Visit Diagnosis: Unsteadiness on feet (R26.81);Other abnormalities of gait and mobility (R26.89);Muscle weakness (generalized) (M62.81);Difficulty in walking, not elsewhere classified (R26.2);Other symptoms and signs involving the nervous system (R29.898);Hemiplegia and hemiparesis;History of falling (Z91.81) Hemiplegia - Right/Left: Left Hemiplegia - dominant/non-dominant:  (ambidextrous) Hemiplegia - caused by: Cerebral infarction     Time: 9147-8295 PT Time Calculation (min) (ACUTE ONLY): 17 min  Charges:  $Gait Training: 8-22 mins                     Teniqua Marron R. PTA Acute Rehabilitation Services Office: [phone]    Catalina Antigua 06/17/2022, 11:25 AM

## 2022-06-17 NOTE — Plan of Care (Signed)
Noted pt echo showed EF 30-35%. No previous echo to compare. Pt no cardiac symptoms at this time. Discussed with internal medicine team, will need cardiology outpt follow up. I put in order for outpt cardiology referral.   Marvel Plan, MD PhD Stroke Neurology 06/17/2022 7:50 PM

## 2022-06-17 NOTE — Discharge Summary (Addendum)
Family Medicine Teaching [facility] Discharge Summary  Patient name: Mike [name] Medical record number: [id number] Date of birth: [birth year] Age: 66 y.o. Gender: male Date of Admission: 06/15/2022  Date of Discharge: 11/13 Admitting Physician: Mike Living, MD  Primary Care Provider: Reece Leader, DO Consultants: neurology  Indication for Hospitalization: CVA  Discharge Diagnoses/Problem List:  Other Problems addressed during stay:  Principal Problem:   Stroke [facility]) Active Problems:   Hypertension   Diabetes (HCC)   Hyperlipidemia associated with type 2 diabetes mellitus [facility])    Brief Hospital Course:  Mike [name] is a 66 y.o.male with a history of HTN, HLD, T2DM who was admitted to the [facility] Teaching Service at [facility] for left-sided weakness and facial droop 2/2 right-sided lacunar stroke. His hospital course is detailed below:  Cerebrovascular accident Patient came to the hospital with a 1 day history of left-sided weakness, left facial droop, and dysarthria on presentation.  Fine motor movements on the left side were also diminished.  CT and MRI showed lacunar infarct in the right corona radiata and lentiform with no hemorrhage.  Neurology is following and recommends DAPT for three weeks and then aspirin alone thereafter. Patient instructed to follow up neurology outpatient, referral placed prior to discharge.   Chest pain Patient presents to the hospital with chest pain.  Patient has a history of MI in 2014.  EKG shows sinus tachycardia. Troponins stable. Chest pain resolved well before discharge home.   Other chronic conditions were medically managed with home medications and formulary alternatives as necessary (HTN, HLD, DM)  PCP Follow-up Recommendations: Direct LDL obtained, statin continued at discharge and adjust as appropriate.  Ensure patient taking aspirin 325 mg and plavix 75 mg daily for 3 months and then just aspirin after.  Please  ensure patient follows up with neurology outpatient.  Ensure patient maintains follow up with hypertension.  A1c 10.4, started on DM regimen in the hospital, reassess and adjust regimen as appropriate. Likely will need insulin as well, initiate as appropriate.  Ejection fraction found to be low at 30-35%. Follow-up to see if medication management is appropriate for this patient.  Disposition: home  Discharge Condition: medically stable  Discharge Exam:  Vitals:   06/17/22 0740 06/17/22 1144  BP: (!) 153/93 (!) 160/102  Pulse: 79 81  Resp: 19 18  Temp: 98 F (36.7 C) 98.6 F (37 C)  SpO2: 99% 97%   Physical Exam: General: NAD. Well-appearing male. Cardiovascular: RRR.  No R/G/M appreciated Respiratory: Normal effort.  CTA B Neuro: A&Ox4.  Left facial droop improving but still present.  Normal sensation.  5/5 strength in extremities.  Cranial nerves intact.  Significant Procedures: none  Significant Labs and Imaging:  Recent Labs  Lab 06/16/22 0300  WBC 8.1  HGB 15.4  HCT 45.7  PLT 244   Recent Labs  Lab 06/16/22 0300  NA 138  K 3.6  CL 105  CO2 24  GLUCOSE 243*  BUN 14  CREATININE 0.83  CALCIUM 8.9  MG 2.1  ALKPHOS 98  AST 21  ALT 25  ALBUMIN 3.5    ECHOCARDIOGRAM COMPLETE BUBBLE STUDY  Result Date: 06/17/2022    ECHOCARDIOGRAM REPORT   Patient Name:   Mike [name] Date of Exam: 06/17/2022 Medical Rec #:  [id number]     Height:       67.0 in Accession #:    [id number]    Weight:       168.0 lb Date of Birth:  1955/08/30      BSA:          1.878 m Patient Age:    66 years      BP:           154/95 mmHg Patient Gender: M             HR:           78 bpm. Exam Location:  Inpatient Procedure: 2D Echo, Intracardiac Opacification Agent and Saline Contrast Bubble            Study Indications:    stroke  History:        Patient has no prior history of Echocardiogram examinations.                 Risk Factors:Diabetes, Dyslipidemia and Hypertension.  Sonographer:     Cathie Hoops Referring Phys: [id number] Gloris Manchester IMPRESSIONS  1. Global hypokinesis worse in inferior base. Left ventricular ejection fraction, by estimation, is 30 to 35%. The left ventricle has moderately decreased function. The left ventricle demonstrates global hypokinesis. The left ventricular internal cavity  size was moderately dilated. Left ventricular diastolic parameters are consistent with Grade I diastolic dysfunction (impaired relaxation).  2. Right ventricular systolic function is normal. The right ventricular size is normal.  3. The mitral valve is abnormal. No evidence of mitral valve regurgitation. No evidence of mitral stenosis.  4. The aortic valve is tricuspid. There is mild calcification of the aortic valve. There is mild thickening of the aortic valve. Aortic valve regurgitation is trivial. Aortic valve sclerosis is present, with no evidence of aortic valve stenosis.  5. The inferior vena cava is normal in size with greater than 50% respiratory variability, suggesting right atrial pressure of 3 mmHg. FINDINGS  Left Ventricle: Global hypokinesis worse in inferior base. Left ventricular ejection fraction, by estimation, is 30 to 35%. The left ventricle has moderately decreased function. The left ventricle demonstrates global hypokinesis. Definity contrast agent  was given IV to delineate the left ventricular endocardial borders. The left ventricular internal cavity size was moderately dilated. There is no left ventricular hypertrophy. Left ventricular diastolic parameters are consistent with Grade I diastolic dysfunction (impaired relaxation). Right Ventricle: The right ventricular size is normal. No increase in right ventricular wall thickness. Right ventricular systolic function is normal. Left Atrium: Left atrial size was normal in size. Right Atrium: Right atrial size was normal in size. Pericardium: There is no evidence of pericardial effusion. Mitral Valve: The mitral valve is abnormal.  There is mild thickening of the mitral valve leaflet(s). There is mild calcification of the mitral valve leaflet(s). Mild mitral annular calcification. No evidence of mitral valve regurgitation. No evidence of mitral valve stenosis. Tricuspid Valve: The tricuspid valve is normal in structure. Tricuspid valve regurgitation is not demonstrated. No evidence of tricuspid stenosis. Aortic Valve: The aortic valve is tricuspid. There is mild calcification of the aortic valve. There is mild thickening of the aortic valve. Aortic valve regurgitation is trivial. Aortic regurgitation PHT measures 766 msec. Aortic valve sclerosis is present, with no evidence of aortic valve stenosis. Aortic valve mean gradient measures 2.0 mmHg. Aortic valve peak gradient measures 4.1 mmHg. Aortic valve area, by VTI measures 2.40 cm. Pulmonic Valve: The pulmonic valve was normal in structure. Pulmonic valve regurgitation is not visualized. No evidence of pulmonic stenosis. Aorta: The aortic root is normal in size and structure. Venous: The inferior vena cava is normal in size with greater than 50% respiratory variability,  suggesting right atrial pressure of 3 mmHg. IAS/Shunts: No atrial level shunt detected by color flow Doppler. Agitated saline contrast was given intravenously to evaluate for intracardiac shunting.  LEFT VENTRICLE PLAX 2D LVIDd:         5.30 cm      Diastology LVIDs:         4.10 cm      LV e' medial:    3.37 cm/s LV PW:         1.00 cm      LV E/e' medial:  7.7 LV IVS:        1.10 cm      LV e' lateral:   3.48 cm/s LVOT diam:     2.00 cm      LV E/e' lateral: 7.4 LV SV:         44 LV SV Index:   23 LVOT Area:     3.14 cm  LV Volumes (MOD) LV vol d, MOD A2C: 116.0 ml LV vol d, MOD A4C: 144.0 ml LV vol s, MOD A2C: 73.8 ml LV vol s, MOD A4C: 84.5 ml LV SV MOD A2C:     42.2 ml LV SV MOD A4C:     144.0 ml LV SV MOD BP:      49.5 ml RIGHT VENTRICLE RV Basal diam:  2.30 cm RV Mid diam:    1.60 cm RV S prime:     15.40 cm/s TAPSE  (M-mode): 1.6 cm LEFT ATRIUM           Index        RIGHT ATRIUM          Index LA diam:      3.10 cm 1.65 cm/m   RA Area:     8.64 cm LA Vol (A4C): 23.5 ml 12.51 ml/m  RA Volume:   14.60 ml 7.77 ml/m  AORTIC VALVE                    PULMONIC VALVE AV Area (Vmax):    2.38 cm     PV Vmax:       0.63 m/s AV Area (Vmean):   2.26 cm     PV Peak grad:  1.6 mmHg AV Area (VTI):     2.40 cm AV Vmax:           101.00 cm/s AV Vmean:          68.400 cm/s AV VTI:            0.183 m AV Peak Grad:      4.1 mmHg AV Mean Grad:      2.0 mmHg LVOT Vmax:         76.50 cm/s LVOT Vmean:        49.300 cm/s LVOT VTI:          0.140 m LVOT/AV VTI ratio: 0.77 AI PHT:            766 msec  AORTA Ao Root diam: 3.70 cm Ao Asc diam:  3.50 cm MITRAL VALVE MV Area (PHT): 3.31 cm    SHUNTS MV Decel Time: 229 msec    Systemic VTI:  0.14 m MR Peak grad: 18.9 mmHg    Systemic Diam: 2.00 cm MR Vmax:      217.50 cm/s MV E velocity: 25.90 cm/s MV A velocity: 89.85 cm/s MV E/A ratio:  0.29 Charlton Haws MD Electronically signed by Charlton Haws MD Signature Date/Time: 06/17/2022/9:28:21 AM  Final    DG CHEST PORT 1 VIEW  Result Date: 06/15/2022 CLINICAL DATA:  Dyspnea, left arm weakness EXAM: PORTABLE CHEST 1 VIEW COMPARISON:  None Available. FINDINGS: Mild cardiomegaly. Both lungs are clear. The visualized skeletal structures are unremarkable. IMPRESSION: Mild cardiomegaly without acute abnormality of the lungs in AP portable projection. Electronically Signed   By: Jearld Lesch M.D.   On: 06/15/2022 15:31   MR BRAIN WO CONTRAST  Result Date: 06/15/2022 CLINICAL DATA:  66 year old male code stroke presentation with age indeterminate right hemisphere lacunar infarct, abnormal CTA suspicious for intracranial atherosclerosis versus thromboembolic disease. EXAM: MRI HEAD WITHOUT CONTRAST TECHNIQUE: Multiplanar, multiecho pulse sequences of the brain and surrounding structures were obtained without intravenous contrast. COMPARISON:  CT head  and CTA head and neck earlier today. FINDINGS: Brain: Confluent diffusion restriction in the right corona radiata and tracking to the right lentiform corresponding to the earlier plain CT finding. Associated T2 and FLAIR hyperintensity. No hemorrhage or mass effect. No other restricted diffusion. No midline shift, mass effect, evidence of mass lesion, ventriculomegaly, extra-axial collection or acute intracranial hemorrhage. Cervicomedullary junction and pituitary are within normal limits. Additional mild scattered bilateral, mostly subcortical, white matter T2 and FLAIR hyperintensity. No cortical encephalomalacia or chronic cerebral blood products identified. Left basal ganglia, bilateral thalami, brainstem and cerebellum appear negative. Vascular: Major intracranial vascular flow voids are preserved. Skull and upper cervical spine: Negative. Visualized bone marrow signal is within normal limits. Sinuses/Orbits: Rightward gaze now. Paranasal sinuses remain well aerated. Other: Mastoids are clear.  Negative visible scalp and face. IMPRESSION: 1. Acute lacunar infarct in the Right corona radiata and lentiform corresponding to the earlier plain CT finding. No associated hemorrhage or mass effect. Preliminary report of this was discussed by telephone with Dr. Derry Lory on 06/15/2022 at 1215 hours. 2. Underlying mild for age nonspecific cerebral white matter signal changes. Electronically Signed   By: Odessa Fleming M.D.   On: 06/15/2022 13:03   CT ANGIO HEAD NECK W WO CM  Result Date: 06/15/2022 CLINICAL DATA:  Code stroke. 66 year old male with facial droop and weakness last known well 2300 hours. EXAM: CT ANGIOGRAPHY HEAD AND NECK TECHNIQUE: Multidetector CT imaging of the head and neck was performed using the standard protocol during bolus administration of intravenous contrast. Multiplanar CT image reconstructions and MIPs were obtained to evaluate the vascular anatomy. Carotid stenosis measurements (when  applicable) are obtained utilizing NASCET criteria, using the distal internal carotid diameter as the denominator. RADIATION DOSE REDUCTION: This exam was performed according to the departmental dose-optimization program which includes automated exposure control, adjustment of the mA and/or kV according to patient size and/or use of iterative reconstruction technique. CONTRAST:  75mL OMNIPAQUE IOHEXOL 350 MG/ML SOLN COMPARISON:  Plain head CT 1104 hours today. FINDINGS: CTA NECK Skeleton: Carious posterior dentition on the left. Mild for age cervical spine degeneration. No acute osseous abnormality identified. Upper chest: Negative. Other neck: Negative. Aortic arch: 3 vessel arch configuration. Soft more so than calcified arch atherosclerosis. Right carotid system: Brachiocephalic and proximal right CCA soft plaque without stenosis. Intermittent soft plaque before the bifurcation. Mild to moderate soft plaque at the bifurcation and faint calcified right ICA bulb plaque without stenosis. Left carotid system: Similar mostly soft plaque with no significant stenosis. Vertebral arteries: Mild proximal right subclavian artery plaque without stenosis. Moderate soft plaque at the right vertebral artery origin with moderate stenosis on series 8, image 137. Right vertebral otherwise negative to the skull base. Left subclavian artery bulky  soft plaque resulting in estimated 56% stenosis (series 7, image 291) but only mildly affecting the left vertebral artery origin on series 8, image 140. No significant origin stenosis. Mildly dominant left vertebral otherwise negative to the skull base. CTA HEAD Posterior circulation: Mildly dominant left vertebral V4 segment. No significant distal vertebral plaque or stenosis. Patent PICA origins. Patent and mildly irregular basilar artery with no significant stenosis. Patent SCA and left PCA origins. Fetal type right PCA origin. Left posterior communicating artery diminutive or absent.  Right PCA branches are within normal limits. But there is up to moderate asymmetric irregularity and attenuation of the left PCA distal P2 and P3 segments best demonstrated on series 12, image 21. Anterior circulation: Both ICA siphons are patent, and the left appears dominant owing to dominant left and diminutive or absent right ACA A1 segments. No significant ICA siphon plaque or stenosis. Normal right posterior communicating artery origin. Patent carotid termini MCA origins. Anterior communicating artery and bilateral ACA branches are within normal limits. Left MCA M1 segment and bifurcation appear patent without stenosis. No left MCA branch occlusion identified. Right MCA M1 segment is patent although mildly to moderate Leigh irregular and stenotic just proximal to the bifurcation. And at the bifurcation there is suspicion of a short 3 mm area of severe stenosis or near occlusion at the anterior M2 division. See series 10, image 21. Moderate irregularity and stenosis also at the other M2 division. But no complete MCA branch occlusion. Venous sinuses: Early contrast timing, grossly patent. Anatomic variants: Mildly dominant left vertebral artery. Fetal type right PCA origin. Dominant left and diminutive or absent right ACA A1 segments. Review of the MIP images confirms the above findings IMPRESSION: 1. No large vessel occlusion, but positive for multifocal intracranial irregularity and stenosis: - distal Right MCA M1 and bifurcation, with near occlusion of the anterior M2 division. - Moderate stenosis of the Left PCA distal P2 and P3 segments. This could be thromboembolic disease rather than intracranial atherosclerosis (see #2). 2. Extracranially there is abundant soft plaque in both cervical carotid arteries. No significant carotid stenosis but Moderate stenosis of the Right Vertebral Artery origin and 56% stenosis of proximal the Left subclavian artery both due to bulky soft plaque. Electronically Signed   By:  Odessa Fleming M.D.   On: 06/15/2022 11:41   CT HEAD CODE STROKE WO CONTRAST  Result Date: 06/15/2022 CLINICAL DATA:  Code stroke. 66 year old male with facial droop and weakness last known well 2300 hours. EXAM: CT HEAD WITHOUT CONTRAST TECHNIQUE: Contiguous axial images were obtained from the base of the skull through the vertex without intravenous contrast. RADIATION DOSE REDUCTION: This exam was performed according to the departmental dose-optimization program which includes automated exposure control, adjustment of the mA and/or kV according to patient size and/or use of iterative reconstruction technique. COMPARISON:  None Available. FINDINGS: Brain: Asymmetric confluent hypodensity in the right corona radiata tracking toward the right lentiform on series 5 image 31. There seems to be minor associated mass effect on the nearby right lateral ventricle. No midline shift. No ventriculomegaly. No acute intracranial hemorrhage identified. Elsewhere gray-white differentiation is within normal limits throughout the brain. No cortically based acute infarct identified. Vascular: Faint Calcified atherosclerosis at the skull base. No suspicious intracranial vascular hyperdensity. Skull: No acute osseous abnormality identified. Sinuses/Orbits: Tympanic cavities, Visualized paranasal sinuses and mastoids are clear. Other: Visualized scalp soft tissues are within normal limits. Orbits appear within normal limits, no gaze deviation. ASPECTS [facility] Stroke Program Early  CT Score) Total score (0-10 with 10 being normal): 10; white matter hypodensity in the right hemisphere but no cortical cytotoxic edema identified. IMPRESSION: 1. Age indeterminate lacunar infarct of the Right corona radiata and lentiform. No associated hemorrhage or significant mass effect. Query left side symptoms. 2. Otherwise negative for age, ASPECTS 10. 3. These results were communicated to Dr. Derry LoryKhaliqdina at 11:13 am on 06/15/2022 by text page via the  [facility] messaging system. Electronically Signed   By: Odessa FlemingH  Hall M.D.   On: 06/15/2022 11:13     Results/Tests Pending at Time of Discharge: none  Discharge Medications:  Allergies as of 06/17/2022   No Known Allergies      Medication List     TAKE these medications    aspirin EC 325 MG tablet Take 1 tablet (325 mg total) by mouth daily. Start taking on: June 18, 2022   atorvastatin 80 MG tablet Commonly known as: LIPITOR Take 1 tablet (80 mg total) by mouth daily. Start taking on: June 18, 2022   clopidogrel 75 MG tablet Commonly known as: PLAVIX Take 1 tablet (75 mg total) by mouth daily. Start taking on: June 18, 2022   losartan 25 MG tablet Commonly known as: COZAAR Take 1 tablet (25 mg total) by mouth daily. Start taking on: June 18, 2022   metFORMIN 500 MG 24 hr tablet Commonly known as: GLUCOPHAGE-XR Take 1 tablet (500 mg total) by mouth daily with breakfast. Start taking on: June 18, 2022               Durable Medical Equipment  (From admission, onward)           Start     Ordered   06/17/22 1014  For home use only DME Walker rolling  Once       Question Answer Comment  Walker: With 5 Inch Wheels   Patient needs a walker to treat with the following condition Stroke [facility](HCC)      06/17/22 1013            Discharge Instructions: Please refer to Patient Instructions section of EMR for full details.  Patient was counseled important signs and symptoms that should prompt return to medical care, changes in medications, dietary instructions, activity restrictions, and follow up appointments.   Follow-Up Appointments:  Follow-up Information     [facility]. Schedule an appointment as soon as possible for a visit in 1 month(s).   Specialty: Neurology Why: stroke clinic Contact information: [address] [place] [id number] [phone]        Outpt Rehabilitation  [facility]. Schedule an appointment as soon as possible for a visit in 1 week(s).   Specialty: Rehabilitation Contact information: [address] [id number] mc [place] [id number] [phone]        Alfredo MartinezMaxwell, Allee, MD. Go on 06/24/2022.   Specialty: Family Medicine Why: Appointment at 1:30 pm, please arrive 15 minutes earlier. Contact information: [address] [place] [address] [phone]         Mike LeaderGanta, Antwoin Lackey, DO. Go on 07/05/2022.   Specialty: Family Medicine Why: Appointment at 1:50 pm, please arrive 15 minutes earlier. Contact information: [address] [place] [address] [phone]                 Lance MussHoang, Daniela B, MD 06/17/2022, 1:35 PM PGY-1, [facility]   I was personally present and performed or re-performed the history, physical exam and  medical decision making activities of this service and have verified that the service and findings are accurately documented in the intern's note. My edits are noted within the note above as appropriate. Please also see attending's attestation.   Mike Leader, DO                  06/17/2022, 1:38 PM PGY-3, [facility]

## 2022-06-17 NOTE — Progress Notes (Signed)
  Echocardiogram 2D Echocardiogram has been performed.  Mike [name] 06/17/2022, 9:08 AM

## 2022-06-17 NOTE — Discharge Instructions (Addendum)
You were hospitalized at [facility] due to a stroke.  We expect this is from uncontrolled high blood pressure and diabetes which improved after monitoring. It is important that you take all medications as prescribed and follow up with your primary doctor for continued management of your hypertension and diabetes.  We are so glad you are feeling better.  Please take aspirin 325 mg and plavix 75 mg daily for 3 months, after 3 months then just take aspirin alone. Be sure to follow-up with your regularly scheduled appointments, please follow up with neurology outpatient as well. Thank you for allowing [place] to be a part of your medical care. If you have any weakness with vision changes or headaches then please return to the emergency department.  Take care, [facility] family medicine team

## 2022-06-17 NOTE — TOC Transition Note (Addendum)
Transition of Care [facility]) - CM/SW Discharge Note   Patient Details  Name: Mike [name] MRN: [id number] Date of Birth: January 05, 1956  [facility] CM/SW Contact:  Kermit Balo, RN Phone Number: 06/17/2022, 10:56 AM   Clinical Narrative:    Pt is from home alone but states his brother can check on him. No DME at home and wasn't taking any prescription medications at home. No PCP.  [facility] will pick patient up in their clinic. Walker for home ordered through [employer] and will be delivered to the room. Pt will have to obtain the tub seat outside the hospital as his insurance doesn't cover this item.  Outpatient arranged through [facility]. Information on the AVS.  Pts states his brother can provide transport home. Pt has medicare starting 07/05/2022: [id number]  1356: medications to be delivered to the room per [facility] pharmacy. Pt states he can afford the $22 for the meds.  Final next level of care: OP Rehab Barriers to Discharge: No Barriers Identified   Patient Goals and CMS Choice   CMS Medicare.gov Compare Post Acute Care list provided to:: Patient Choice offered to / list presented to : Patient  Discharge Placement                       Discharge Plan and Services                DME Arranged: Walker rolling DME Agency: [employer] Date DME Agency Contacted: 06/17/22   Representative spoke with at DME Agency: Lawernce Keas            Social Determinants of Health (SDOH) Interventions     Readmission Risk Interventions     No data to display

## 2022-06-17 NOTE — Progress Notes (Signed)
Nsg Discharge Note  Admit Date:  06/15/2022 Discharge date: 06/17/2022   Mike [name] to be D/C'd Home per MD order.  AVS completed. Patient/caregiver able to verbalize understanding.  Discharge Medication: Allergies as of 06/17/2022   No Known Allergies      Medication List     TAKE these medications    aspirin EC 325 MG tablet Take 1 tablet (325 mg total) by mouth daily. Start taking on: June 18, 2022   atorvastatin 80 MG tablet Commonly known as: LIPITOR Take 1 tablet (80 mg total) by mouth daily. Start taking on: June 18, 2022   clopidogrel 75 MG tablet Commonly known as: PLAVIX Take 1 tablet (75 mg total) by mouth daily. Start taking on: June 18, 2022   losartan 25 MG tablet Commonly known as: COZAAR Take 1 tablet (25 mg total) by mouth daily. Start taking on: June 18, 2022   metFORMIN 500 MG 24 hr tablet Commonly known as: GLUCOPHAGE-XR Take 1 tablet (500 mg total) by mouth daily with breakfast. Start taking on: June 18, 2022               Durable Medical Equipment  (From admission, onward)           Start     Ordered   06/17/22 1014  For home use only DME Walker rolling  Once       Question Answer Comment  Walker: With 5 Inch Wheels   Patient needs a walker to treat with the following condition Stroke (HCC)      06/17/22 1013            Discharge Assessment: Vitals:   06/17/22 0740 06/17/22 1144  BP: (!) 153/93 (!) 160/102  Pulse: 79 81  Resp: 19 18  Temp: 98 F (36.7 C) 98.6 F (37 C)  SpO2: 99% 97%   Skin clean, dry and intact without evidence of skin break down, no evidence of skin tears noted. IV catheter discontinued intact. Site without signs and symptoms of complications - no redness or edema noted at insertion site, patient denies c/o pain - only slight tenderness at site.  Dressing with slight pressure applied.  D/c Instructions-Education: Discharge instructions given to patient/family with  verbalized understanding. D/c education completed with patient/family including follow up instructions, medication list, d/c activities limitations if indicated, with other d/c instructions as indicated by MD - patient able to verbalize understanding, all questions fully answered. Patient instructed to return to ED, call 911, or call MD for any changes in condition.  Patient escorted via WC, and D/C home via private auto.  Kizzie Bane, RN 06/17/2022 3:27 PM

## 2022-06-24 ENCOUNTER — Ambulatory Visit: Payer: Self-pay | Admitting: Student

## 2022-06-24 NOTE — Patient Instructions (Incomplete)
It was great to see you today! Thank you for choosing [facility] for your primary care. Mike [name] was seen for follow-up.  Please continue with the Plavix for a total of 3 weeks.  You will continue with the aspirin even after discontinuing the Plavix.  You will stop the Plavix after December 3rd.   If you haven't already, sign up for My Chart to have easy access to your labs results, and communication with your primary care physician.  I recommend that you always bring your medications to each appointment as this makes it easy to ensure you are on the correct medications and helps [place] not miss refills when you need them. Call the clinic at [phone] if your symptoms worsen or you have any concerns.  You should return to our clinic No follow-ups on file. Please arrive 15 minutes before your appointment to ensure smooth check in process.  We appreciate your efforts in making this happen.  Thank you for allowing me to participate in your care, Alfredo Martinez, MD 06/24/2022, 7:54 AM PGY-2, [facility]

## 2022-06-24 NOTE — Progress Notes (Deleted)
  SUBJECTIVE:   CHIEF COMPLAINT / HPI:    Hospital Follow Up  Mike [name] is a 66 y.o.male with a history of HTN, HLD, T2DM who was admitted to the [facility] Teaching Service at [facility] for left-sided weakness and facial droop 2/2 right-sided lacunar stroke. CT and MRI showed lacunar infarct in the right corona radiata and lentiform with no hemorrhage.  Neurology recommended DAPT for three weeks and then aspirin alone thereafter.    He was started on Atorvastatin 80 mg for HLD.   Patient had an A1C of 10.4 on admission, was started on Metformin 500 daily.   Echo with bubble showed global hypokinesis worse in the inferior base with left ventricular EF of 30 to 35% and moderately decreased function.  Left ventricular cavity was moderately dilated with grade 1 diastolic dysfunction.  Right ventricular function normal.  Valves seem to remain intact with mild mitral annular calcification.   Given new diagnosis of DM2 , needs foot/eye exam, UACr Also needs UTD colonoscopy, hep C screen    PERTINENT  PMH / PSH:   DM2, HLD, CVA, HTN   OBJECTIVE:  There were no vitals taken for this visit. Physical Exam   ASSESSMENT/PLAN:  There are no diagnoses linked to this encounter. No follow-ups on file. Alfredo Martinez, MD 06/24/2022, 7:48 AM  New onset HFrEF, needs cardiology appt. GDMT would include Spiro, SGLT2, BB, +/- diuretics, ARB. Patient is currently only on ARB, will rx ***. Unsure of cause of HFrEF possibly related to DM2 + HTN. Will defer to cardiology expertise, euvolemic on examination today and asymptomatic. No diuresis indicated.   Start LAI 10 U + Metformin increased to 500 BID and routinely check CBGs.  Needs colonoscopy, Hep C, UACr, foot and eye exam.    PGY-2, [facility] {    This will disappear when note is signed, click to select method of visit    :1}

## 2022-07-05 ENCOUNTER — Encounter: Payer: Self-pay | Admitting: Family Medicine

## 2022-07-05 ENCOUNTER — Ambulatory Visit (INDEPENDENT_AMBULATORY_CARE_PROVIDER_SITE_OTHER): Payer: Medicare Other | Admitting: Family Medicine

## 2022-07-05 VITALS — BP 161/96 | HR 97 | Wt 157.4 lb

## 2022-07-05 DIAGNOSIS — I10 Essential (primary) hypertension: Secondary | ICD-10-CM

## 2022-07-05 DIAGNOSIS — E1122 Type 2 diabetes mellitus with diabetic chronic kidney disease: Secondary | ICD-10-CM

## 2022-07-05 DIAGNOSIS — I639 Cerebral infarction, unspecified: Secondary | ICD-10-CM | POA: Diagnosis not present

## 2022-07-05 DIAGNOSIS — N181 Chronic kidney disease, stage 1: Secondary | ICD-10-CM

## 2022-07-05 DIAGNOSIS — I129 Hypertensive chronic kidney disease with stage 1 through stage 4 chronic kidney disease, or unspecified chronic kidney disease: Secondary | ICD-10-CM | POA: Diagnosis not present

## 2022-07-05 MED ORDER — LOSARTAN POTASSIUM 50 MG PO TABS: 50.0000 mg | ORAL_TABLET | Freq: Every day | ORAL | 3 refills | Status: DC

## 2022-07-05 MED ORDER — METFORMIN HCL ER 500 MG PO TB24: 500.0000 mg | ORAL_TABLET | Freq: Two times a day (BID) | ORAL | 1 refills | Status: DC

## 2022-07-05 NOTE — Progress Notes (Unsigned)
    SUBJECTIVE:   CHIEF COMPLAINT / HPI:   Patient is a new patient here to establish care and for a follow up. History of DM, hypertension and new stroke last month. He was recently hospitalized for a recent CVA and still having some weakness but improved. Denies chest pain or dyspnea. He reports compliance with his all his prescribed medications, tolerating them well. Also concerned that since the stroke, his voice has not been the same.   OBJECTIVE:   BP (!) 161/96   Pulse 97   Wt 157 lb 6 oz (71.4 kg)   SpO2 99%   BMI 24.65 kg/m   General: Patient well-appearing, in no acute distress. HEENT: non-tender thyroid CV: RRR, no murmurs or gallops auscultated Resp: CTAB, no wheezing, rales or rhonchi noted  Neuro: CN 2-12 grossly intact, 5/5 grip strength bilaterally, 5/5 UE and LE strength bilaterally, gross sensation intact,  normal gait   ASSESSMENT/PLAN:   Hypertension -BP 169/97, on repeat 161/96 which is not at goal of <140/90 -increased losartan to 50 mg daily -maintain BP log -reassuringly patient asymptomatic, ED precautions discussed -follow up in 1-2 weeks for BP check, repeat BMP at this time to monitor electrolytes and renal function   Diabetes (HCC) -A1c 10.4 from the recent hospitalization -increased metformin to 500 mg bid, hesitant to initiate insulin therapy as patient has had issues with noncompliance in the past -will schedule him a visit with pharmacy to further discuss best options, consider initiating glipizide once maxing out metformin  -repeat A1c in 2 months   Stroke [facility]) -hospitalized last month for CVA, patient reports some residual weakness but neurological exam unremarkable -instructed to schedule an appointment soon with neurology in 2-3 months and maintain outpatient follow up -continue DAPT -continue statin therapy  -speech therapy referral placed -discussed the importance of minimizing risk factors for future stroke prevention by ensure both  hypertension and DM are well-controlled, patient voiced understanding    -Med rec reviewed and updated appropriately    Reece Leader, DO [facility]

## 2022-07-05 NOTE — Patient Instructions (Addendum)
It was great seeing you today!  Today we discussed your blood pressure and diabetes. Your blood pressure is high, we do not want you to have another stroke again so it is important to keep both your sugar levels and blood pressures within a good range. I have increased your losartan to 50 mg daily. I have also increased your metformin to 500 mg twice daily.   Please schedule an appointment with the neurologist to be seen in 2-3 months.    Please follow up at your next scheduled appointment in 1-2 weeks, if anything arises between now and then, please don't hesitate to contact our office.   Thank you for allowing [place] to be a part of your medical care!  Thank you, Dr. Robyne Peers  Also a reminder of our clinic's no-show policy. Please make sure to arrive at least 15 minutes prior to your scheduled appointment time. Please try to cancel before 24 hours if you are not able to make it. If you no-show for 2 appointments then you will be receiving a warning letter. If you no-show after 3 visits, then you may be at risk of being dismissed from our clinic. This is to ensure that everyone is able to be seen in a timely manner. Thank you, we appreciate your assistance with this!

## 2022-07-06 NOTE — Assessment & Plan Note (Addendum)
-  hospitalized last month for CVA, patient reports some residual weakness but neurological exam unremarkable -instructed to schedule an appointment soon with neurology in 2-3 months and maintain outpatient follow up -continue DAPT -continue statin therapy  -speech therapy referral placed -discussed the importance of minimizing risk factors for future stroke prevention by ensure both hypertension and DM are well-controlled, patient voiced understanding

## 2022-07-06 NOTE — Assessment & Plan Note (Signed)
-  A1c 10.4 from the recent hospitalization -increased metformin to 500 mg bid, hesitant to initiate insulin therapy as patient has had issues with noncompliance in the past -will schedule him a visit with pharmacy to further discuss best options, consider initiating glipizide once maxing out metformin  -repeat A1c in 2 months

## 2022-07-06 NOTE — Assessment & Plan Note (Signed)
-  BP 169/97, on repeat 161/96 which is not at goal of <140/90 -increased losartan to 50 mg daily -maintain BP log -reassuringly patient asymptomatic, ED precautions discussed -follow up in 1-2 weeks for BP check, repeat BMP at this time to monitor electrolytes and renal function

## 2022-07-09 ENCOUNTER — Ambulatory Visit: Payer: Medicare Other | Attending: Family Medicine | Admitting: Occupational Therapy

## 2022-07-09 ENCOUNTER — Ambulatory Visit: Payer: Medicare Other | Admitting: Physical Therapy

## 2022-07-09 ENCOUNTER — Encounter: Payer: Self-pay | Admitting: Occupational Therapy

## 2022-07-09 DIAGNOSIS — R29818 Other symptoms and signs involving the nervous system: Secondary | ICD-10-CM | POA: Insufficient documentation

## 2022-07-09 DIAGNOSIS — R278 Other lack of coordination: Secondary | ICD-10-CM | POA: Insufficient documentation

## 2022-07-09 DIAGNOSIS — Z789 Other specified health status: Secondary | ICD-10-CM | POA: Insufficient documentation

## 2022-07-09 DIAGNOSIS — R471 Dysarthria and anarthria: Secondary | ICD-10-CM | POA: Diagnosis present

## 2022-07-09 DIAGNOSIS — R2689 Other abnormalities of gait and mobility: Secondary | ICD-10-CM | POA: Diagnosis present

## 2022-07-09 DIAGNOSIS — M6281 Muscle weakness (generalized): Secondary | ICD-10-CM | POA: Insufficient documentation

## 2022-07-09 DIAGNOSIS — R490 Dysphonia: Secondary | ICD-10-CM | POA: Insufficient documentation

## 2022-07-09 NOTE — Therapy (Signed)
OUTPATIENT OCCUPATIONAL THERAPY NEURO EVALUATION  Patient Name: Mike [name] MRN: [id number] DOB:[birth year], 66 y.o., male Today's Date: 07/09/2022  PCP: Reece Leader, DO  REFERRING PROVIDER: Carney Living, MD  END OF SESSION:  OT End of Session - 07/09/22 1331     Visit Number 1    Number of Visits 13    Date for OT Re-Evaluation 10/04/22    Authorization Type Medicare primary    Progress Note Due on Visit 10    OT Start Time 1445    OT Stop Time 1528    OT Time Calculation (min) 43 min    Activity Tolerance Patient tolerated treatment well    Behavior During Therapy WFL for tasks assessed/performed             Past Medical History:  Diagnosis Date   Blood disorder    Diabetes (HCC)    High blood pressure    High cholesterol    Kidney stones    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    Past Surgical History:  Procedure Laterality Date   CORONARY ANGIOPLASTY WITH STENT PLACEMENT     VENA CAVA FILTER PLACEMENT     Patient Active Problem List   Diagnosis Date Noted   Hyperlipidemia associated with type 2 diabetes mellitus (HCC) 06/16/2022   Stroke (HCC) 06/15/2022   Hypertension 06/15/2022   Diabetes (HCC) 06/15/2022    ONSET DATE: 06/14/2022  REFERRING DIAG: I63.9 (ICD-10-CM) - Cerebrovascular accident (CVA)  THERAPY DIAG:  Other symptoms and signs involving the nervous system  Impaired coordination of upper extremity  Impaired activities of daily living  Impaired instrumental activities of daily living  Rationale for Evaluation and Treatment: Rehabilitation  SUBJECTIVE:   SUBJECTIVE STATEMENT: He feels that he has had changes with BUE, BLE, and his voice. He does not feel like he is the same person as before. He has a lot of weakness but yet feels he can do anything just with extra time.   Pt accompanied by: self  PERTINENT HISTORY: Per hospital d/c note, "Mike [name] is a 66 y.o.male with a history of HTN, HLD, T2DM who was admitted to the [facility] Teaching Service at [facility] for left-sided weakness and facial droop 2/2 right-sided lacunar stroke. His hospital course is detailed below:   Cerebrovascular accident Patient came to the hospital with a 1 day history of left-sided weakness, left facial droop, and dysarthria on presentation.  Fine motor movements on the left side were also diminished.  CT and MRI showed lacunar infarct in the right corona radiata and lentiform with no hemorrhage.  Neurology is following and recommends DAPT for three weeks and then aspirin alone thereafter. Patient instructed to follow up neurology outpatient, referral placed prior to discharge."   PRECAUTIONS: Fall  WEIGHT BEARING RESTRICTIONS: No  PAIN:  Are you having pain? No  FALLS: Has patient fallen in last 6 months? Yes. Number of falls 3 with onset of stroke  LIVING ENVIRONMENT: Lives with: lives alone Lives in: House/apartment Stairs: Yes: External: 4 steps; none Has following equipment at home: Dan Humphreys - 2 wheeled  PLOF: Independent; retired working in Production designer, theatre/television/film and many other positions; was driving  PATIENT GOALS: Return to [employer]  OBJECTIVE:   HAND DOMINANCE: Ambidextrous - can write with both hands  ADLs: Overall ADLs: mod I with extra time  IADLs:             Overall IADLs: mod I Light housekeeping: dependent  Medication management: unsure if he is taking  correctly Handwriting: 100% legible and Reports not as good as prior to CVA; pt writes with both R and L with L handwriting less clear than R, which pt reports is baseline  MOBILITY STATUS: Independent  POSTURE COMMENTS:   Sitting balance:  WNL  ACTIVITY TOLERANCE: Activity tolerance: fair; fatigue noted  FUNCTIONAL OUTCOME MEASURES:     Total score = sum of the activity scores/number of activities Minimum detectable change (90%CI) for average score = 2 points Minimum detectable change (90%CI) for single activity score = 3 points  UPPER EXTREMITY ROM:      AROM Right (eval) Left (eval)  Shoulder flexion WNL WNL  Shoulder abduction WNL WNL  Elbow flexion WNL WNL  Elbow extension WNL WFL  Wrist flexion WNL WFL  Wrist extension WNL WNL  Wrist pronation WNL WNL  Wrist supination WNL WFL   Digit Composite Flexion WNL WNL  Digit Composite Extension WNL WNL  Digit Opposition WNL WNL  (Blank rows = not tested)  UPPER EXTREMITY MMT:     MMT Right (eval) Left (eval)  Shoulder flexion WNL WNL  Shoulder abduction WNL WNL  Elbow flexion WNL WNL  Elbow extension WNL WNL  (Blank rows = not tested)  HAND FUNCTION: Grip strength: Right: 58.6 lbs; Left: 55.3 lbs  COORDINATION: Finger Nose Finger test: WNL 9 Hole Peg test: Right: 31.28 sec; Left: 31.32 sec  SENSATION: WFL  EDEMA: WNL  MUSCLE TONE: RUE: Within functional limits and LUE: Within functional limits  COGNITION: Overall cognitive status: Within functional limits for tasks assessed  VISION: Subjective report: wears outdated progressive lenses; states vision is blurrier since his stroke; denies diplopia; floaters at baseline  Baseline vision: Bifocals, Wears glasses all the time, Wears glasses for distance only, and Wears glasses for reading only Visual history:  none reported  VISION ASSESSMENT: Able to read clock on wall and paper without error  PERCEPTION: WFL  PRAXIS: Not tested  OBSERVATIONS: Pt appears well kept with glasses donned. Demonstrates ability to don and doff jacket with increased time. Ambulates independently from lobby to therapy gym and back.   TODAY'S TREATMENT:                                                                                                                               Initiated BUE coordination HEP  PATIENT EDUCATION: Education details: HEP; therapy POC Person educated: Patient Education method: Explanation and Handouts Education comprehension: verbalized understanding and needs further education  HOME EXERCISE  PROGRAM: 12/5 (eval) - BUE coordination    GOALS:  SHORT TERM GOALS: Target date: 08/06/2022   Patient will be independent with BUE coordination HEP. Baseline: Goal status: INITIAL  2.  Patient will report at least 1-point increase in average PSFS score or at least 2-point increase in a single activity score indicating functionally significant improvement given minimum detectable change. Baseline: total score-3; see objective section for further details Goal status: INITIAL  LONG TERM GOALS:  Target date: 10/04/2022  Patient will demonstrate updated BUE HEP with 25% verbal cues or less for proper execution. Baseline:  Goal status: INITIAL  2.  Patient will report at least two-point increase in average PSFS score or at least three-point increase in a single activity score indicating functionally significant improvement given minimum detectable change.  Baseline: total score-3; see objective section for further details Goal status: INITIAL  3.  Patient will complete nine-hole peg with each hand in 30 seconds or less. Baseline: R - 31.28 seconds; L - 31.32 seconds Goal status: INITIAL   ASSESSMENT:  CLINICAL IMPRESSION: Patient is a 66 y.o. male who was seen today for occupational therapy evaluation following CVA. Hx includes HTN, DM II, and HLD. Patient currently presents slightly below baseline level of functioning demonstrating functional deficits and impairments as noted below. Pt would benefit from skilled OT services in the outpatient setting to work on impairments as noted below to help pt return to PLOF as able.    PERFORMANCE DEFICITS: in functional skills including ADLs, IADLs, coordination, Fine motor control, and UE functional use, cognitive skills including.   IMPAIRMENTS: are limiting patient from ADLs and IADLs.   CO-MORBIDITIES: may have co-morbidities  that affects occupational performance. Patient will benefit from skilled OT to address above impairments and  improve overall function.  MODIFICATION OR ASSISTANCE TO COMPLETE EVALUATION: No modification of tasks or assist necessary to complete an evaluation.  OT OCCUPATIONAL PROFILE AND HISTORY: Problem focused assessment: Including review of records relating to presenting problem.  CLINICAL DECISION MAKING: LOW - limited treatment options, no task modification necessary  REHAB POTENTIAL: Good  EVALUATION COMPLEXITY: Low    PLAN:  OT FREQUENCY: 1x/week  OT DURATION: 8 weeks  PLANNED INTERVENTIONS: self care/ADL training, therapeutic exercise, therapeutic activity, neuromuscular re-education, patient/family education, DME and/or AE instructions, and Re-evaluation  RECOMMENDED OTHER SERVICES: none at this time  CONSULTED AND AGREED WITH PLAN OF CARE: Patient  PLAN FOR NEXT SESSION: review coordination HEP   Delana Meyer, OT 07/09/2022, 5:16 PM

## 2022-07-11 ENCOUNTER — Ambulatory Visit: Payer: Medicare Other | Attending: Cardiology | Admitting: Cardiology

## 2022-07-11 ENCOUNTER — Encounter: Payer: Self-pay | Admitting: Cardiology

## 2022-07-11 VITALS — BP 140/80 | HR 70 | Ht 67.0 in | Wt 159.0 lb

## 2022-07-11 DIAGNOSIS — E785 Hyperlipidemia, unspecified: Secondary | ICD-10-CM | POA: Insufficient documentation

## 2022-07-11 DIAGNOSIS — E1169 Type 2 diabetes mellitus with other specified complication: Secondary | ICD-10-CM | POA: Insufficient documentation

## 2022-07-11 DIAGNOSIS — I2510 Atherosclerotic heart disease of native coronary artery without angina pectoris: Secondary | ICD-10-CM | POA: Diagnosis present

## 2022-07-11 DIAGNOSIS — I63431 Cerebral infarction due to embolism of right posterior cerebral artery: Secondary | ICD-10-CM | POA: Diagnosis present

## 2022-07-11 DIAGNOSIS — I255 Ischemic cardiomyopathy: Secondary | ICD-10-CM | POA: Insufficient documentation

## 2022-07-11 DIAGNOSIS — I5022 Chronic systolic (congestive) heart failure: Secondary | ICD-10-CM | POA: Diagnosis not present

## 2022-07-11 MED ORDER — METOPROLOL SUCCINATE ER 25 MG PO TB24: 25.0000 mg | ORAL_TABLET | Freq: Every day | ORAL | 3 refills | Status: DC

## 2022-07-11 MED ORDER — SACUBITRIL-VALSARTAN 24-26 MG PO TABS: 1.0000 | ORAL_TABLET | Freq: Two times a day (BID) | ORAL | 6 refills | Status: DC

## 2022-07-11 NOTE — Progress Notes (Signed)
Cardiology Office Note:    Date:  07/11/2022   ID:  Mike [name], DOB 1955/08/12, MRN [id number]  PCP:  Mike Leader, DO   [facility] Providers Cardiologist:  None     Referring MD: Mike Leader, DO    History of Present Illness:    Mike [name] is a 66 y.o. male here for the evaluation of chronic systolic heart failure with ejection fraction of 30 to 35% at the request of Dr. Robyne [name].  He was at the hospital in November 2023 with left-sided weakness facial droop secondary to right-sided lacunar stroke.  An echocardiogram was performed at that time that showed a EF of 30 to 35%.  Has a history of MI back in 2014. 3 stents.  Hemoglobin A1c was 10.4.  Discharged on Plavix.  Slowly getting better.   No SOB, no chest pain. He has spasm contraction on left arm at bed at night. Pulling like sensation.    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   atorvastatin (LIPITOR) 80 MG tablet Take 1 tablet (80 mg total) by mouth daily.   clopidogrel (PLAVIX) 75 MG tablet Take 1 tablet (75 mg total) by mouth daily.   metFORMIN (GLUCOPHAGE-XR) 500 MG 24 hr tablet Take 1 tablet (500 mg total) by mouth in the morning and at bedtime.   metoprolol succinate (TOPROL-XL) 25 MG 24 hr tablet Take 1 tablet (25 mg total) by mouth daily.   sacubitril-valsartan (ENTRESTO) 24-26 MG Take 1 tablet by mouth 2 (two) times daily.   [DISCONTINUED] losartan (COZAAR) 50 MG tablet Take 1 tablet (50 mg total) by mouth at bedtime.     Allergies:   Patient has no known allergies.   Social History   Socioeconomic History    Marital status: Legally Separated    Spouse name: Not on file   Number of children: Not on file   Years of education: Not on file   Highest education level: Not on file  Occupational History   Not on file  Tobacco Use   Smoking status: Never   Smokeless tobacco: Not on file  Substance and Sexual Activity   Alcohol use: No   Drug use: No   Sexual activity: Not on file  Other Topics Concern   Not on file  Social History Narrative   Diet: Low Carb   Caffeine: 1 cup of coffee daily   Marital Status: Separated, married 1992   Lives in house, 1 stories, 3 persons, no pets   Current/Past profession: Mike [name]    Exercise: No   Living Will: No    DNR- question Mike [name]   POA/HPOA- No as of 09/06/14   Social Determinants of Health   Financial Resource Strain: Not on file  Food Insecurity: Not on file  Transportation Needs: Not on file  Physical Activity: Not on file  Stress: Not on file  Social Connections: Not on file     Family History: The patient's family history includes High blood pressure in his  mother; Liver cancer in his father; Ovarian cancer in his mother.  ROS:   Please see the history of present illness.     All other systems reviewed and are negative.  EKGs/Labs/Other Studies Reviewed:    The following studies were reviewed today: Echocardiogram 06/17/2022: 1. Global hypokinesis worse in inferior base. Left ventricular ejection  fraction, by estimation, is 30 to 35%. The left ventricle has moderately  decreased function. The left ventricle demonstrates global hypokinesis.  The left ventricular internal cavity   size was moderately dilated. Left ventricular diastolic parameters are  consistent with Grade I diastolic dysfunction (impaired relaxation).   2. Right ventricular systolic function is normal. The right ventricular  size is normal.   3. The mitral valve is abnormal. No evidence of mitral valve  regurgitation. No evidence of mitral stenosis.    4. The aortic valve is tricuspid. There is mild calcification of the  aortic valve. There is mild thickening of the aortic valve. Aortic valve  regurgitation is trivial. Aortic valve sclerosis is present, with no  evidence of aortic valve stenosis.   5. The inferior vena cava is normal in size with greater than 50%  respiratory variability, suggesting right atrial pressure of 3 mmHg.   EKG: 06/15/2022: sinus tachycardia 109 with nonspecific ST-T wave changes  Recent Labs: 06/15/2022: TSH 1.535 06/16/2022: ALT 25; BUN 14; Creatinine, Ser 0.83; Hemoglobin 15.4; Magnesium 2.1; Platelets 244; Potassium 3.6; Sodium 138  Recent Lipid Panel    Component Value Date/Time   CHOL 277 (H) 06/15/2022 1500   TRIG 135 06/15/2022 1500   HDL 31 (L) 06/15/2022 1500   CHOLHDL 8.9 06/15/2022 1500   VLDL 27 06/15/2022 1500   LDLCALC NOT CALCULATED 06/15/2022 1500   LDLDIRECT 218 (H) 06/16/2022 0904     Risk Assessment/Calculations:              Physical Exam:    VS:  BP (!) 140/80 (BP Location: Left Arm, Patient Position: Sitting, Cuff Size: Normal)   Pulse 70   Ht 5\' 7"  (1.702 m)   Wt 159 lb (72.1 kg)   SpO2 98%   BMI 24.90 kg/m     Wt Readings from Last 3 Encounters:  07/11/22 159 lb (72.1 kg)  07/05/22 157 lb 6 oz (71.4 kg)  05/28/17 168 lb (76.2 kg)     GEN:  Well nourished, well developed in no acute distress HEENT: Normal NECK: No JVD; No carotid bruits LYMPHATICS: No lymphadenopathy CARDIAC: RRR, no murmurs, rubs, gallops RESPIRATORY:  Clear to auscultation without rales, wheezing or rhonchi  ABDOMEN: Soft, non-tender, non-distended MUSCULOSKELETAL:  No edema; No deformity  SKIN: Warm and dry NEUROLOGIC:  Alert and oriented x 3 PSYCHIATRIC:  Normal affect   ASSESSMENT:    1. Cerebrovascular accident (CVA) due to embolism of right posterior cerebral artery (HCC)   2. Hyperlipidemia associated with type 2 diabetes mellitus (HCC)   3. Chronic systolic heart failure  (HCC)   4. Ischemic cardiomyopathy   5. Coronary artery disease involving native coronary artery of native heart without angina pectoris    PLAN:    In order of problems listed above:  Chronic systolic heart failure -EF 30 to 35% on echocardiogram with inferior wall hypokinesis.  Prior myocardial infarction in 2014.  He had 3 stents placed back in 2014.  [facility] he states.  I do not see any records at this point.  Unsure if this ejection fraction has been chronic.  Nonetheless he is feeling  NYHA class I type symptoms.  No shortness of breath. - I will start Toprol 25 mg once a day - Stop losartan 50 and start Entresto 24/26 twice daily. - At next visit with APP check basic metabolic profile.  Consider addition of spironolactone.  If Entresto too expensive, go back to losartan. - In 3 months repeat echocardiogram.  Coronary artery disease - Possible ischemic cardiomyopathy.  3 prior stents in [place] in the setting of MI in 2014.  Lacunar stroke - Slowly getting better.  Currently on Plavix and aspirin  Hyperlipidemia - Continue with atorvastatin 80 mg once a day LDL goal less than 70, in fact less than 55 given stroke and prior MI.          Medication Adjustments/Labs and Tests Ordered: Current medicines are reviewed at length with the patient today.  Concerns regarding medicines are outlined above.  No orders of the defined types were placed in this encounter.  Meds ordered this encounter  Medications   metoprolol succinate (TOPROL-XL) 25 MG 24 hr tablet    Sig: Take 1 tablet (25 mg total) by mouth daily.    Dispense:  90 tablet    Refill:  3   sacubitril-valsartan (ENTRESTO) 24-26 MG    Sig: Take 1 tablet by mouth 2 (two) times daily.    Dispense:  60 tablet    Refill:  6    Patient Instructions  Medication Instructions:  Please discontinue your Losartan. Start Metoprolol Succinate 25 mg a day. Start Entresto 24-26 mg one tablet twice a day. Continue all  other medications as listed.  *If you need a refill on your cardiac medications before your next appointment, please call your pharmacy*   Follow-Up: At [facility], you and your health needs are our priority.  As part of our continuing mission to provide you with exceptional heart care, we have created designated Provider Care Teams.  These Care Teams include your primary Cardiologist (physician) and Advanced Practice Providers (APPs -  Physician Assistants and Nurse Practitioners) who all work together to provide you with the care you need, when you need it.  We recommend signing up for the patient portal called "MyChart".  Sign up information is provided on this After Visit Summary.  MyChart is used to connect with patients for Virtual Visits (Telemedicine).  Patients are able to view lab/test results, encounter notes, upcoming appointments, etc.  Non-urgent messages can be sent to your provider as well.   To learn more about what you can do with MyChart, go to [email or web address].    Your next appointment:   2 - 4 week(s)  The format for your next appointment:   In Person  Provider:   Jari Favre, PA-C, Ronie Spies, PA-C, Robin Searing, NP, Jacolyn Reedy, PA-C, Eligha Bridegroom, NP, or [name], PA-C           Important Information About Sugar         Signed, Donato Schultz, MD  07/11/2022 4:05 PM    [facility]

## 2022-07-11 NOTE — Patient Instructions (Signed)
Medication Instructions:  Please discontinue your Losartan. Start Metoprolol Succinate 25 mg a day. Start Entresto 24-26 mg one tablet twice a day. Continue all other medications as listed.  *If you need a refill on your cardiac medications before your next appointment, please call your pharmacy*   Follow-Up: At [facility], you and your health needs are our priority.  As part of our continuing mission to provide you with exceptional heart care, we have created designated Provider Care Teams.  These Care Teams include your primary Cardiologist (physician) and Advanced Practice Providers (APPs -  Physician Assistants and Nurse Practitioners) who all work together to provide you with the care you need, when you need it.  We recommend signing up for the patient portal called "MyChart".  Sign up information is provided on this After Visit Summary.  MyChart is used to connect with patients for Virtual Visits (Telemedicine).  Patients are able to view lab/test results, encounter notes, upcoming appointments, etc.  Non-urgent messages can be sent to your provider as well.   To learn more about what you can do with MyChart, go to [email or web address].    Your next appointment:   2 - 4 week(s)  The format for your next appointment:   In Person  Provider:   Jari Favre, PA-C, Ronie Spies, PA-C, Robin Searing, NP, Jacolyn Reedy, PA-C, Eligha Bridegroom, NP, or [name], PA-C           Important Information About Sugar

## 2022-07-15 ENCOUNTER — Ambulatory Visit: Payer: Medicare Other | Admitting: Occupational Therapy

## 2022-07-15 ENCOUNTER — Encounter: Payer: Self-pay | Admitting: Occupational Therapy

## 2022-07-15 DIAGNOSIS — Z789 Other specified health status: Secondary | ICD-10-CM

## 2022-07-15 DIAGNOSIS — R29818 Other symptoms and signs involving the nervous system: Secondary | ICD-10-CM | POA: Diagnosis not present

## 2022-07-15 DIAGNOSIS — R278 Other lack of coordination: Secondary | ICD-10-CM

## 2022-07-15 NOTE — Therapy (Signed)
OUTPATIENT OCCUPATIONAL THERAPY NEURO TREATMENT  Patient Name: Mike [name] MRN: [id number] DOB:27-Mar-1956, 66 y.o., male Today's Date: 07/15/2022  PCP: [name], DO  REFERRING PROVIDER: Lind Covert, MD  END OF SESSION:  OT End of Session - 07/15/22 1236     Visit Number 2    Number of Visits 13    Date for OT Re-Evaluation 10/04/22    Authorization Type Medicare primary    Progress Note Due on Visit 10    OT Start Time 1233    OT Stop Time 1314    OT Time Calculation (min) 41 min    Activity Tolerance Patient tolerated treatment well    Behavior During Therapy WFL for tasks assessed/performed              Past Medical History:  Diagnosis Date   Blood disorder    Diabetes ([place])    High blood pressure    High cholesterol    Kidney stones    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    Past Surgical History:  Procedure Laterality Date   [place]     Patient Active Problem List   Diagnosis Date Noted   Hyperlipidemia associated with type 2 diabetes mellitus ([place]) 06/16/2022   Stroke ([place]) 06/15/2022   Hypertension 06/15/2022   Diabetes ([place]) 06/15/2022    ONSET DATE: 06/14/2022  REFERRING DIAG: I63.9 (ICD-10-CM) - Cerebrovascular accident (CVA)  THERAPY DIAG:  Other symptoms and signs involving the nervous system  Impaired coordination of upper extremity  Impaired activities of daily living  Impaired instrumental activities of daily living  Rationale for Evaluation and Treatment: Rehabilitation  SUBJECTIVE:   SUBJECTIVE STATEMENT: He has been completing his HEP with much difficulty on L vs R. He cannot button the buttons at the top of his shirt collar as he normally would but is able to get the buttons up the front.   Pt accompanied by: self  PERTINENT HISTORY: Per hospital d/c note, "Mike [name] is a 66 y.o.male with a history of HTN, HLD, T2DM who was admitted to the [facility] for left-sided weakness and facial droop 2/2 right-sided lacunar stroke. His hospital course is detailed below:   Cerebrovascular accident Patient came to the hospital with a 1 day history of left-sided weakness, left facial droop, and dysarthria on presentation.  Fine motor movements on the left side were also diminished.  CT and MRI showed lacunar infarct in the right corona radiata and lentiform with no hemorrhage.  Neurology is following and recommends DAPT for three weeks and then aspirin alone thereafter. Patient instructed to follow up neurology outpatient, referral placed prior to discharge."   PRECAUTIONS: Fall  WEIGHT BEARING RESTRICTIONS: No  PAIN:  Are you having pain? No  FALLS: Has patient fallen in last 6 months? Yes. 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; was driving  PATIENT GOALS: Return to [place]: (from evaluation unless otherwise noted)  HAND DOMINANCE: Ambidextrous - can write with both hands  ADLs: Overall ADLs: mod I with extra time  IADLs:             Overall IADLs: mod I Light housekeeping: dependent  Medication management: unsure if he  is taking correctly Handwriting: 100% legible and Reports not as good as prior to CVA; pt writes with both R and L with L handwriting less clear than R, which pt reports is baseline  MOBILITY STATUS: Independent  POSTURE COMMENTS:   Sitting balance:  WNL  ACTIVITY TOLERANCE: Activity tolerance: fair; fatigue noted  FUNCTIONAL OUTCOME MEASURES:     Total score = sum of the activity scores/number of activities Minimum detectable change (90%CI) for average score = 2 points Minimum detectable change (90%CI) for single activity score = 3  points  UPPER EXTREMITY ROM:     AROM Right (eval) Left (eval)  Shoulder flexion WNL WNL  Shoulder abduction WNL WNL  Elbow flexion WNL WNL  Elbow extension WNL WFL  Wrist flexion WNL WFL  Wrist extension WNL WNL  Wrist pronation WNL WNL  Wrist supination WNL WFL   Digit Composite Flexion WNL WNL  Digit Composite Extension WNL WNL  Digit Opposition WNL WNL  (Blank rows = not tested)  UPPER EXTREMITY MMT:     MMT Right (eval) Left (eval)  Shoulder flexion WNL WNL  Shoulder abduction WNL WNL  Elbow flexion WNL WNL  Elbow extension WNL WNL  (Blank rows = not tested)  HAND FUNCTION: Grip strength: Right: 58.6 lbs; Left: 55.3 lbs  COORDINATION: Finger Nose Finger test: WNL 9 Hole Peg test: Right: 31.28 sec; Left: 31.32 sec  SENSATION: WFL  EDEMA: WNL  MUSCLE TONE: RUE: Within functional limits and LUE: Within functional limits  COGNITION: Overall cognitive status: Within functional limits for tasks assessed  VISION: Subjective report: wears outdated progressive lenses; states vision is blurrier since his stroke; denies diplopia; floaters at baseline  Baseline vision: Bifocals, Wears glasses all the time, Wears glasses for distance only, and Wears glasses for reading only Visual history:  none reported  VISION ASSESSMENT: Able to read clock on wall and paper without error  PERCEPTION: WFL  PRAXIS: Not tested  OBSERVATIONS: Pt appears well kept with glasses donned. Demonstrates ability to don and doff jacket with increased time. Ambulates independently from lobby to therapy gym and back.   07/15/2022 - Pt wearing short sleeve button up shirt to therapy visit.  TODAY'S TREATMENT:             - Neuro re-education completed for duration as noted below including:                                                                                                                    Reviewed BUE coordination HEP  OT initiated green theraputty exercises (search,  grip, and pinch) as noted in patient instructions for coordination and strength  - Self-care/home management completed for duration as noted below including: OT educated pt to complete buttoning of shirt while in front of mirror to use vision to compensate for coordination limtations. Pt able to return demonstration to button and unbotton smaller buttons at top of shirt collar.  OT educated pt on use of lined, tracing paper to help improve  BUE handwriting. Additional handouts provided.   PATIENT EDUCATION: Education details: HEP Person educated: Patient Education method: Chief Technology Officer Education comprehension: verbalized understanding and needs further education  HOME EXERCISE PROGRAM: 12/5 (eval) - BUE coordination  12/11 - BUE green putty    GOALS:  SHORT TERM GOALS: Target date: 08/06/2022   Patient will be independent with BUE coordination HEP. Baseline: Goal status: INITIAL  2.  Patient will report at least 1-point increase in average PSFS score or at least 2-point increase in a single activity score indicating functionally significant improvement given minimum detectable change. Baseline: total score-3; see objective section for further details Goal status: INITIAL  LONG TERM GOALS: Target date: 10/04/2022  Patient will demonstrate updated BUE HEP with 25% verbal cues or less for proper execution. Baseline:  Goal status: INITIAL  2.  Patient will report at least two-point increase in average PSFS score or at least three-point increase in a single activity score indicating functionally significant improvement given minimum detectable change.  Baseline: total score-3; see objective section for further details Goal status: INITIAL  3.  Patient will complete nine-hole peg with each hand in 30 seconds or less. Baseline: R - 31.28 seconds; L - 31.32 seconds Goal status: INITIAL   ASSESSMENT:  CLINICAL IMPRESSION: Patient is a 66 y.o. male who was seen today for  occupational therapy evaluation following CVA. Hx includes HTN, DM II, and HLD. Patient currently presents slightly below baseline level of functioning demonstrating functional deficits and impairments as noted below. Pt would benefit from skilled OT services in the outpatient setting to work on impairments as noted below to help pt return to PLOF as able.    PERFORMANCE DEFICITS: in functional skills including ADLs, IADLs, coordination, Fine motor control, and UE functional use, cognitive skills including.   IMPAIRMENTS: are limiting patient from ADLs and IADLs.   CO-MORBIDITIES: may have co-morbidities  that affects occupational performance. Patient will benefit from skilled OT to address above impairments and improve overall function.  REHAB POTENTIAL: Good   PLAN:  OT FREQUENCY: 1x/week  OT DURATION: 8 weeks  PLANNED INTERVENTIONS: self care/ADL training, therapeutic exercise, therapeutic activity, neuromuscular re-education, patient/family education, DME and/or AE instructions, and Re-evaluation  RECOMMENDED OTHER SERVICES: none at this time  CONSULTED AND AGREED WITH PLAN OF CARE: Patient  PLAN FOR NEXT SESSION: review putty HEP; functional coordination activities   Delana Meyer, OT 07/15/2022, 1:34 PM

## 2022-07-15 NOTE — Patient Instructions (Signed)
  Putty Exercises All exercises to be completed TWICE daily Comments **Putty has a tendency to spread out to fit its environment. Please keep putty contained when not in use, out of the heat, out of reach of children and animals, and away from clothing/fabric as it may stick.** SEARCH Locate and pinch each individual item out of the putty. These can be COINS, BUTTONS, MARBLES, ETC. By holding putty in opposite hand, use affected hand to search and remove items. You can also do this with putty on table (as pictured). Repeat until all items have been removed from the putty.   USE:  3 items for 1 hand  OR 1-2 items for each hand    GRIP Hold the putty in the palm of your hand. Now grip the putty bending your fingers into the putty as far as you can until they quit moving. REPEAT:  10 times HOLD: until fingers quit moving or about 5 seconds    PINCH Roll up some putty to create a small tubular section. Next, pinch the putty and repeat down the section.   i.e. thumb to index finger, thumb to middle finger, thumb to ring finger, and thumb to pinky finger.        REPEAT:  5 times each finger HOLD:  at least 3 seconds   

## 2022-07-16 ENCOUNTER — Inpatient Hospital Stay: Payer: Self-pay | Admitting: Critical Care Medicine

## 2022-07-18 ENCOUNTER — Encounter: Payer: Self-pay | Admitting: Occupational Therapy

## 2022-07-18 ENCOUNTER — Ambulatory Visit: Payer: Medicare Other | Admitting: Occupational Therapy

## 2022-07-18 DIAGNOSIS — R29818 Other symptoms and signs involving the nervous system: Secondary | ICD-10-CM | POA: Diagnosis not present

## 2022-07-18 DIAGNOSIS — R278 Other lack of coordination: Secondary | ICD-10-CM

## 2022-07-18 NOTE — Therapy (Signed)
OUTPATIENT OCCUPATIONAL THERAPY NEURO TREATMENT  Patient Name: Mike [name] MRN: [id number] DOB:10-08-1955, 66 y.o., male Today's Date: 07/15/2022  PCP: Mike Leader, DO  REFERRING PROVIDER: Carney Living, MD  END OF SESSION:  OT End of Session - 07/15/22 1236     Visit Number 2    Number of Visits 13    Date for OT Re-Evaluation 10/04/22    Authorization Type Medicare primary    Progress Note Due on Visit 10    OT Start Time 1233    OT Stop Time 1314    OT Time Calculation (min) 41 min    Activity Tolerance Patient tolerated treatment well    Behavior During Therapy WFL for tasks assessed/performed              Past Medical History:  Diagnosis Date   Blood disorder    Diabetes (HCC)    High blood pressure    High cholesterol    Kidney stones    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    Past Surgical History:  Procedure Laterality Date   CORONARY ANGIOPLASTY WITH STENT PLACEMENT     VENA CAVA FILTER PLACEMENT     Patient Active Problem List   Diagnosis Date Noted   Hyperlipidemia associated with type 2 diabetes mellitus (HCC) 06/16/2022   Stroke (HCC) 06/15/2022   Hypertension 06/15/2022   Diabetes (HCC) 06/15/2022    ONSET DATE: 06/14/2022  REFERRING DIAG: I63.9 (ICD-10-CM) - Cerebrovascular accident (CVA)  THERAPY DIAG:  Other symptoms and signs involving the nervous system  Impaired coordination of upper extremity  Impaired activities of daily [name]  Impaired instrumental activities of daily [name]  Rationale for Evaluation and Treatment: Rehabilitation  SUBJECTIVE:   SUBJECTIVE STATEMENT: I can do everything.  I just do not feel as strong.  I bring homework.   Pt accompanied by: self  PERTINENT HISTORY: Per hospital d/c note, "Mike [name] is a 66 y.o.male with a history of HTN, HLD, T2DM who was admitted to the [facility] at [facility] for left-sided weakness and facial droop 2/2 right-sided lacunar stroke. His hospital course  is detailed below:   Cerebrovascular accident Patient came to the hospital with a 1 day history of left-sided weakness, left facial droop, and dysarthria on presentation.  Fine motor movements on the left side were also diminished.  CT and MRI showed lacunar infarct in the right corona radiata and lentiform with no hemorrhage.  Neurology is following and recommends DAPT for three weeks and then aspirin alone thereafter. Patient instructed to follow up neurology outpatient, referral placed prior to discharge."   PRECAUTIONS: Fall  WEIGHT BEARING RESTRICTIONS: No  PAIN:  Are you having pain? No  FALLS: Has patient fallen in last 6 months? Yes. Number of falls 3 with onset of stroke  [name] ENVIRONMENT: Lives with: lives alone Lives in: House/apartment Stairs: Yes: External: 4 steps; none Has following equipment at home: Walker - 2 wheeled  PLOF: Independent; retired working in Production designer, theatre/television/film and many other positions; was driving  PATIENT GOALS: Return to PLOF  OBJECTIVE: (from evaluation unless otherwise noted)  HAND DOMINANCE: Ambidextrous - can write with both hands  ADLs: Overall ADLs: mod I with extra time  IADLs:             Overall IADLs: mod I Light housekeeping: dependent  Medication management: unsure if he is taking correctly Handwriting: 100% legible and Reports not as good as prior to CVA; pt writes with both R and L with  L handwriting less clear than R, which pt reports is baseline  MOBILITY STATUS: Independent  POSTURE COMMENTS:   Sitting balance:  WNL  ACTIVITY TOLERANCE: Activity tolerance: fair; fatigue noted  FUNCTIONAL OUTCOME MEASURES:     Total score = sum of the activity scores/number of activities Minimum detectable change (90%CI) for average score = 2 points Minimum detectable change (90%CI) for single activity score = 3 points  UPPER EXTREMITY ROM:     AROM Right (eval) Left (eval)  Shoulder flexion WNL WNL  Shoulder abduction WNL WNL   Elbow flexion WNL WNL  Elbow extension WNL WFL  Wrist flexion WNL WFL  Wrist extension WNL WNL  Wrist pronation WNL WNL  Wrist supination WNL WFL   Digit Composite Flexion WNL WNL  Digit Composite Extension WNL WNL  Digit Opposition WNL WNL  (Blank rows = not tested)  UPPER EXTREMITY MMT:     MMT Right (eval) Left (eval)  Shoulder flexion WNL WNL  Shoulder abduction WNL WNL  Elbow flexion WNL WNL  Elbow extension WNL WNL  (Blank rows = not tested)  HAND FUNCTION: Grip strength: Right: 58.6 lbs; Left: 55.3 lbs  COORDINATION: Finger Nose Finger test: WNL 9 Hole Peg test: Right: 31.28 sec; Left: 31.32 sec  SENSATION: WFL  EDEMA: WNL  MUSCLE TONE: RUE: Within functional limits and LUE: Within functional limits  COGNITION: Overall cognitive status: Within functional limits for tasks assessed  VISION: Subjective report: wears outdated progressive lenses; states vision is blurrier since his stroke; denies diplopia; floaters at baseline  Baseline vision: Bifocals, Wears glasses all the time, Wears glasses for distance only, and Wears glasses for reading only Visual history:  none reported  VISION ASSESSMENT: Able to read clock on wall and paper without error  PERCEPTION: WFL  PRAXIS: Not tested  OBSERVATIONS: Pt appears well kept with glasses donned. Demonstrates ability to don and doff jacket with increased time. Ambulates independently from lobby to therapy gym and back.   07/15/2022 - Pt wearing short sleeve button up shirt to therapy visit.  TODAY'S TREATMENT:            07/18/22 Reviewed patient's homework - handwriting skills.  Tracing letters, and copying paragraph from magazine.   100% legible.  Worked on functional hand skills per plan of care.  Patient able to cut with scissors - clearly more left hand dominant, although able to complete with RUE with cueing to reduce excessive wrist flexion.   Patient mentioning fatigue - napping daily 1-2 hours in  which he actually sleeps despite sleeping 10 hours at night - patient feels it may be medication related - will see MD tomorrow, encouraged him to discuss.  Patient appears to be doing well functionally from motoric standpoint - anticipate he will meet OT goals within next few sessions.  Eager for SLP evaluation next week.      - Neuro re-education completed for duration as noted below including:  Reviewed BUE coordination HEP  OT initiated green theraputty exercises (search, grip, and pinch) as noted in patient instructions for coordination and strength  - Self-care/home management completed for duration as noted below including: OT educated pt to complete buttoning of shirt while in front of mirror to use vision to compensate for coordination limtations. Pt able to return demonstration to button and unbotton smaller buttons at top of shirt collar.  OT educated pt on use of lined, tracing paper to help improve BUE handwriting. Additional handouts provided.   PATIENT EDUCATION: Education details: HEP Person educated: Patient Education method: Chief Technology Officer Education comprehension: verbalized understanding and needs further education  HOME EXERCISE PROGRAM: 12/5 (eval) - BUE coordination  12/11 - BUE green putty    GOALS:  SHORT TERM GOALS: Target date: 08/06/2022   Patient will be independent with BUE coordination HEP. Baseline: Goal status: INITIAL  2.  Patient will report at least 1-point increase in average PSFS score or at least 2-point increase in a single activity score indicating functionally significant improvement given minimum detectable change. Baseline: total score-3; see objective section for further details Goal status: INITIAL  LONG TERM GOALS: Target date: 10/04/2022  Patient will demonstrate updated BUE HEP with 25% verbal cues or less for  proper execution. Baseline:  Goal status: INITIAL  2.  Patient will report at least two-point increase in average PSFS score or at least three-point increase in a single activity score indicating functionally significant improvement given minimum detectable change.  Baseline: total score-3; see objective section for further details Goal status: INITIAL  3.  Patient will complete nine-hole peg with each hand in 30 seconds or less. Baseline: R - 31.28 seconds; L - 31.32 seconds Goal status: INITIAL   ASSESSMENT:  CLINICAL IMPRESSION: Patient is a 66 y.o. male who was seen today for occupational therapy evaluation following CVA. Hx includes HTN, DM II, and HLD. Patient currently presents slightly below baseline level of functioning demonstrating functional deficits and impairments as noted below. Pt would benefit from skilled OT services in the outpatient setting to work on impairments as noted below to help pt return to PLOF as able.    PERFORMANCE DEFICITS: in functional skills including ADLs, IADLs, coordination, Fine motor control, and UE functional use, cognitive skills including.   IMPAIRMENTS: are limiting patient from ADLs and IADLs.   CO-MORBIDITIES: may have co-morbidities  that affects occupational performance. Patient will benefit from skilled OT to address above impairments and improve overall function.  REHAB POTENTIAL: Good   PLAN:  OT FREQUENCY: 1x/week  OT DURATION: 8 weeks  PLANNED INTERVENTIONS: self care/ADL training, therapeutic exercise, therapeutic activity, neuromuscular re-education, patient/family education, DME and/or AE instructions, and Re-evaluation  RECOMMENDED OTHER SERVICES: none at this time  CONSULTED AND AGREED WITH PLAN OF CARE: Patient  PLAN FOR NEXT SESSION: check goals and discharge as appropriate  Bretta Bang, OTR/L 07/18/22 6:17 PM Phone: [phone] Fax: [phone]

## 2022-07-19 ENCOUNTER — Ambulatory Visit (INDEPENDENT_AMBULATORY_CARE_PROVIDER_SITE_OTHER): Payer: Medicare Other | Admitting: Family Medicine

## 2022-07-19 ENCOUNTER — Encounter: Payer: Self-pay | Admitting: Family Medicine

## 2022-07-19 VITALS — BP 129/85 | HR 86 | Ht 67.0 in | Wt 154.0 lb

## 2022-07-19 DIAGNOSIS — E1169 Type 2 diabetes mellitus with other specified complication: Secondary | ICD-10-CM | POA: Diagnosis not present

## 2022-07-19 DIAGNOSIS — E785 Hyperlipidemia, unspecified: Secondary | ICD-10-CM | POA: Diagnosis not present

## 2022-07-19 DIAGNOSIS — I639 Cerebral infarction, unspecified: Secondary | ICD-10-CM

## 2022-07-19 DIAGNOSIS — I10 Essential (primary) hypertension: Secondary | ICD-10-CM

## 2022-07-19 DIAGNOSIS — I255 Ischemic cardiomyopathy: Secondary | ICD-10-CM

## 2022-07-19 MED ORDER — CLOPIDOGREL BISULFATE 75 MG PO TABS: 75.0000 mg | ORAL_TABLET | Freq: Every day | ORAL | 0 refills | Status: DC

## 2022-07-19 MED ORDER — ATORVASTATIN CALCIUM 80 MG PO TABS: 80.0000 mg | ORAL_TABLET | Freq: Every day | ORAL | 1 refills | Status: DC

## 2022-07-19 MED ORDER — ASPIRIN 325 MG PO TBEC: 325.0000 mg | DELAYED_RELEASE_TABLET | Freq: Every day | ORAL | 1 refills | Status: DC

## 2022-07-19 NOTE — Assessment & Plan Note (Addendum)
-  continue metformin -diet and exercise counseling provided, handout provided  -ophthalmologist annually  -continue statin, refills provided today -follow up in 2 months for repeat A1c, consider scheduling pharmacy visit to further optimize DM management

## 2022-07-19 NOTE — Progress Notes (Signed)
    SUBJECTIVE:   CHIEF COMPLAINT / HPI:   Patient presents for BP follow up. Denies chest pain, dyspnea, leg swelling or other symptoms. Has been checking blood pressures at home which have been high before seeing cardiology but have improved to normal range since then. The cardiologist discontinued his losartan, instead they started metoprolol with entresto. Denies any concerns at this time.   OBJECTIVE:   BP 129/85   Pulse 86   Ht 5\' 7"  (1.702 m)   Wt 154 lb (69.9 kg)   SpO2 98%   BMI 24.12 kg/m   General: Patient well-appearing, in no acute distress. CV: RRR, no murmurs or gallops auscultated Resp: CTAB, no wheezing, rales or rhonchi noted  Ext: radial pulses strong and equal bilaterally, no LE edema noted bilaterally  ASSESSMENT/PLAN:   Hypertension -BP 129/85, at goal of <140/90 -discontinue losartan while continuing metoprolol and entresto per cardiology -pending BMP to assess electrolytes and renal function in the setting of being previously on losartan and now entresto  -diet and exercise counseling provided    Diabetes (HCC) -continue metformin -diet and exercise counseling provided, handout provided  -ophthalmologist annually  -continue statin, refills provided today -follow up in 2 months for repeat A1c, consider scheduling pharmacy visit to further optimize DM management  Stroke [facility]) -neurology outpatient follow up scheduled for 12/21 -continue DAPT, refills provided -continue statin, refills provided  -continue risk modification    -Med rec reviewed and updated appropriately    1/22, DO [facility]

## 2022-07-19 NOTE — Assessment & Plan Note (Addendum)
-  BP 129/85, at goal of <140/90 -discontinue losartan while continuing metoprolol and entresto per cardiology -pending BMP to assess electrolytes and renal function in the setting of being previously on losartan and now entresto  -diet and exercise counseling provided

## 2022-07-19 NOTE — Assessment & Plan Note (Signed)
-  neurology outpatient follow up scheduled for 12/21 -continue DAPT, refills provided -continue statin, refills provided  -continue risk modification

## 2022-07-19 NOTE — Patient Instructions (Addendum)
It was great seeing you today!  Today we discussed many things including your blood pressure which looks great. Please continue to take the metoprolol and entresto, STOP taking the losartan as directed by the cardiologist.   I have sent in refills of your aspirin, plavix and atorvastatin.  Regarding your diabetes, we will recheck your glucose level at the next visit in 2 months. At that time we will see if we need to adjust your medications. For now, continue to take the metformin twice daily. It is important that you eat a balanced diet and stay physically active as this will not only help you diabetes but your overall health as well.   Please follow up at your next scheduled appointment in 2 months, if anything arises between now and then, please don't hesitate to contact our office.   Thank you for allowing [place] to be a part of your medical care!  Thank you, Dr. Robyne Peers  Also a reminder of our clinic's no-show policy. Please make sure to arrive at least 15 minutes prior to your scheduled appointment time. Please try to cancel before 24 hours if you are not able to make it. If you no-show for 2 appointments then you will be receiving a warning letter. If you no-show after 3 visits, then you may be at risk of being dismissed from our clinic. This is to ensure that everyone is able to be seen in a timely manner. Thank you, we appreciate your assistance with this!

## 2022-07-19 NOTE — Therapy (Signed)
OUTPATIENT SPEECH LANGUAGE PATHOLOGY EVALUATION   Patient Name: Mike [name] MRN: [id number] DOB:1955/10/17, 66 y.o., male Today's Date: 07/22/2022  PCP: Reece Leader, DO REFERRING PROVIDER: Westley Chandler, MD   END OF SESSION:  End of Session - 07/22/22 1629     Visit Number 1    Number of Visits 16    Date for SLP Re-Evaluation 09/16/22    Authorization Type Medicare    Progress Note Due on Visit 10    SLP Start Time 1510    SLP Stop Time  1556    SLP Time Calculation (min) 46 min    Activity Tolerance Patient tolerated treatment well             Past Medical History:  Diagnosis Date   Blood disorder    Diabetes (HCC)    High blood pressure    High cholesterol    Kidney stones    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    Past Surgical History:  Procedure Laterality Date   CORONARY ANGIOPLASTY WITH STENT PLACEMENT     VENA CAVA FILTER PLACEMENT     Patient Active Problem List   Diagnosis Date Noted   Hyperlipidemia associated with type 2 diabetes mellitus (HCC) 06/16/2022   Stroke (HCC) 06/15/2022   Hypertension 06/15/2022   Diabetes (HCC) 06/15/2022    ONSET DATE: 06-15-22   REFERRING DIAG: I63.9 (ICD-10-CM) - Cerebrovascular accident (CVA), unspecified mechanism (HCC)   THERAPY DIAG:  Dysarthria and anarthria - Plan: SLP plan of care cert/re-cert  Dysphonia - Plan: SLP plan of care cert/re-cert  Rationale for Evaluation and Treatment: Rehabilitation  SUBJECTIVE:   SUBJECTIVE STATEMENT: "When I had the stroke I lost my voice"  Pt accompanied by: self  PERTINENT HISTORY: History of DM, hypertension and new stroke last month. He was recently hospitalized for a recent CVA and still having some weakness but improved.   PAIN:  Are you having pain? No  FALLS: Has patient fallen in last 6 months?  See PT evaluation for details  LIVING ENVIRONMENT: Lives with: lives alone Lives in: House/apartment  PLOF:  Level of assistance: Independent Employment:  Part-time employment  PATIENT GOALS: return to PLOF  OBJECTIVE:   DIAGNOSTIC FINDINGS: 06/15/22 MRI IMPRESSION: 1. Acute lacunar infarct in the Right corona radiata and lentiform corresponding to the earlier plain CT finding. No associated hemorrhage or mass effect.  COGNITION: Overall cognitive status: Within functional limits for tasks assessed Functional deficits: Pt endorses some short term memory deficits, began prior to the stroke. Denies having significant impact on overall management of medications, finances, or schedule.   COGNITIVE COMMUNICATION: Auditory comprehension: WFL Verbal expression: WFL Functional communication: WFL  ORAL MOTOR EXAMINATION: Overall status: Impaired:   Lingual: Bilateral (Coordination) Facial: Left (ROM, Symmetry, and Strength) Comments: pt denies swallowing difficulties. Endorses ageusia.  MOTOR SPEECH: assessed across variety of speech tasks: reading, word repetition, generative discourse sample Overall motor speech: impaired Rate of Speech: WFL Dysfluencies: none evidenced Phonation: low vocal intensity and intermittent aphonia during sustained phonation tasks Oral reading loudness average: 69 dB Conversational loudness average: 64 dB Voice Quality: hoarse, breathy, low vocal intensity, vocal fatigue, and aphonic Respiration: thoracic breathing Word and Phrasal Stress: WFL Resonance: WFL Articulation: Appears intact Diadochokinetic Rate (DDK): unremarkable Intelligibility: Intelligibility reduced Motor planning: Appears intact Effective technique: increased vocal intensity  PERCEPTUAL VOICE ASSESSMENT: Voice quality: hoarse, breathy, low vocal intensity, and aphonic Vocal abuse: none evidenced or endorsed Resonance: normal Respiratory function: thoracic breathing  OBJECTIVE VOICE ASSESSMENT: Maximum  phonation time for sustained "ah": 12 second Conversational pitch average: subjectively assessed to be within gross normal  limits Conversational loudness average: 64 dB Conversational loudness range: 62-69 dB S/z ratio: 2.6 (Suggestive of dysfunction >1.0) note pt had difficulty with production of /z/ sound which may impact validity of this measure   SOCIAL HISTORY: Occupation: retired Counsellor intake: suboptimal Caffeine/alcohol intake: minimal Daily voice use: minimal  STANDARDIZED ASSESSMENTS: Deferred d/t nature of complaints   PATIENT REPORTED OUTCOME MEASURES (PROM): Communication Participation Item Bank: 21 Most effective communication in quiet environments with familiar communication partners    TODAY'S TREATMENT:  07-22-22: Education provided on evaluation results and SLP's recommendations. Pt verbalizes agreement with POC. Initiated training re: vocal function exercises. SLP provided usual modeling and usual max-A for pt to demonstrate accurate completion of warm up, stretch, contraction, and sustained phonation exercises. Initiated HEP.   PATIENT EDUCATION: Education details: see above Person educated: Patient Education method: Programmer, multimedia, Demonstration, Verbal cues, and Handouts Education comprehension: verbalized understanding, returned demonstration, verbal cues required, and needs further education   GOALS: Goals reviewed with patient? Yes  SHORT TERM GOALS: Target date: 08/19/2022  Pt will report daily HEP completion (BID recommended) over 1 week period Baseline: Goal status: INITIAL  2.  Pt will carryover compensations for dysarthria 18/20 sentences  Baseline:  Goal status: INITIAL  3.  Pt will maintain 70+ dB in paragraph oral reading  Baseline:  Goal status: INITIAL  4.  Pt will demonstrate voice strengthening exercises with usual min-A over 2 sessions Baseline:  Goal status: INITIAL   LONG TERM GOALS: Target date: 09/16/2022  Pt will complete vocal exercises mod I  Baseline:  Goal status: INITIAL  2.  Pt will be 90% intelligible over 15 minute conversation in  mildly noisy environment Baseline:  Goal status: INITIAL  3.  Pt will report improved subjective perception of communication efficacy via PROM  Baseline:  Goal status: INITIAL  ASSESSMENT:  CLINICAL IMPRESSION: Patient is a 66 y.o. M who was seen today for voice and motor speech evalution s/p stroke. Pt presenting with mild dysarthria vs dysphonia. SLP recommends referring provider consider ENT referral to assess for any abnormalities which may be impacting pt's perceptual voice presentation. Voice is c/b low vocal intensity, aphonia and breathiness on sustained phonation tasks, and overall reduced strength which does negatively impact intelligibility. Articulation, motor planning, resonance, prosody all within gross normal limits. SLP trialed vocal function exercises to assess if pt is able to generate increased power and clarity in voicing. With usual max-A, mild improvement observed with pt elevating conversational average volume to 68 dB. Skilled ST is indicated to optimize pt's communication efficacy.   OBJECTIVE IMPAIRMENTS: include dysarthria and voice disorder. These impairments are limiting patient from effectively communicating at home and in community. Factors affecting potential to achieve goals and functional outcome are medical prognosis(?). Patient will benefit from skilled SLP services to address above impairments and improve overall function.  REHAB POTENTIAL: Fair (depending on underlying etiology for dysphonia)  PLAN:  SLP FREQUENCY: 2x/week  SLP DURATION: 8 weeks  PLANNED INTERVENTIONS: Cueing hierachy, Internal/external aids, Functional tasks, SLP instruction and feedback, Compensatory strategies, and Patient/family education    Maia Breslow, CCC-SLP 07/22/2022, 4:29 PM

## 2022-07-20 LAB — BASIC METABOLIC PANEL
BUN/Creatinine Ratio: 16 (ref 10–24)
BUN: 17 mg/dL (ref 8–27)
CO2: 23 mmol/L (ref 20–29)
Calcium: 9.5 mg/dL (ref 8.6–10.2)
Chloride: 104 mmol/L (ref 96–106)
Creatinine, Ser: 1.07 mg/dL (ref 0.76–1.27)
Glucose: 212 mg/dL — ABNORMAL HIGH (ref 70–99)
Potassium: 4.3 mmol/L (ref 3.5–5.2)
Sodium: 140 mmol/L (ref 134–144)
eGFR: 77 mL/min/{1.73_m2} (ref >59)

## 2022-07-22 ENCOUNTER — Encounter: Payer: Self-pay | Admitting: Physical Therapy

## 2022-07-22 ENCOUNTER — Other Ambulatory Visit: Payer: Self-pay

## 2022-07-22 ENCOUNTER — Ambulatory Visit: Payer: Medicare Other | Admitting: Speech Pathology

## 2022-07-22 ENCOUNTER — Encounter: Payer: Self-pay | Admitting: Occupational Therapy

## 2022-07-22 ENCOUNTER — Ambulatory Visit: Payer: Medicare Other | Admitting: Physical Therapy

## 2022-07-22 ENCOUNTER — Ambulatory Visit: Payer: Medicare Other | Admitting: Occupational Therapy

## 2022-07-22 VITALS — BP 153/87 | HR 79

## 2022-07-22 DIAGNOSIS — R29818 Other symptoms and signs involving the nervous system: Secondary | ICD-10-CM | POA: Diagnosis not present

## 2022-07-22 DIAGNOSIS — Z789 Other specified health status: Secondary | ICD-10-CM

## 2022-07-22 DIAGNOSIS — R471 Dysarthria and anarthria: Secondary | ICD-10-CM

## 2022-07-22 DIAGNOSIS — M6281 Muscle weakness (generalized): Secondary | ICD-10-CM

## 2022-07-22 DIAGNOSIS — R278 Other lack of coordination: Secondary | ICD-10-CM

## 2022-07-22 DIAGNOSIS — R2689 Other abnormalities of gait and mobility: Secondary | ICD-10-CM

## 2022-07-22 DIAGNOSIS — R490 Dysphonia: Secondary | ICD-10-CM

## 2022-07-22 NOTE — Patient Instructions (Signed)
Vocal Function Exercises  WARM UP Hold out "ooll" as long and you can with good quality Remember: LOUD and STRONG Feel the sound in your face, not your throat If you have excess tension in your throat, try pushing hands together at your chest  STRETCH Glide from a low note to a high note Use nasal "eee" Maintain your good quality sound that we practiced in therapy CONTRACT  Glide from a high note to a low note Use nasal "eee" Maintain your good quality sound that we practiced in therapy SUSTAINED PHONATION Use nasal "eee" Hold out as long as possible at low note Hold out as long as possible at mid note Hold out as long as possible at high note  Complete each part 2x, 2x a day   Remember these are exercises! They are supposed to be challenging!!

## 2022-07-22 NOTE — Therapy (Unsigned)
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  Rationale for Evaluation and Treatment: Rehabilitation  SUBJECTIVE:   SUBJECTIVE STATEMENT: I can do everything.  I just do not feel as strong.  I bring homework.   Pt accompanied by: self  PERTINENT HISTORY: Per hospital d/c note, "Mike [name] is a 66 y.o.male with a history of HTN, HLD, T2DM who was admitted to the [facility] for left-sided weakness and facial droop 2/2 right-sided lacunar stroke. His hospital course is detailed below:   Cerebrovascular accident Patient came to the hospital with a 1 day history of left-sided weakness, left facial droop, and dysarthria on presentation.  Fine motor movements on the left side were also diminished.  CT and MRI showed lacunar infarct in the right corona radiata and lentiform with no hemorrhage.  Neurology is following and recommends DAPT for three weeks and then aspirin alone thereafter. Patient instructed to follow up neurology outpatient, referral placed prior to discharge."   PRECAUTIONS: Fall  WEIGHT BEARING RESTRICTIONS:  No  PAIN:  Are you having pain? No  FALLS: Has patient fallen in last 6 months? Yes. 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; was driving  PATIENT GOALS: Return to [place]: (from evaluation unless otherwise noted)  HAND DOMINANCE: Ambidextrous - can write with both hands  ADLs: Overall ADLs: mod I with extra time  IADLs:             Overall IADLs: mod I Light housekeeping: dependent  Medication management: unsure if he is taking correctly Handwriting: 100% legible and Reports not as good as prior to CVA; pt writes with both R and L with L handwriting less clear than R, which pt reports is baseline  MOBILITY STATUS: Independent  POSTURE COMMENTS:   Sitting balance:  WNL  ACTIVITY TOLERANCE: Activity tolerance: fair; fatigue noted  FUNCTIONAL OUTCOME MEASURES:     Total score = sum of the activity scores/number of activities Minimum detectable change (90%CI) for average score = 2 points Minimum detectable change (90%CI) for single activity score = 3 points  07/22/2022 -   UPPER EXTREMITY ROM:     AROM Right (eval) Left (eval)  Shoulder flexion WNL WNL  Shoulder abduction WNL WNL  Elbow flexion WNL WNL  Elbow extension WNL WFL  Wrist flexion WNL WFL  Wrist extension WNL WNL  Wrist pronation  WNL WNL  Wrist supination WNL WFL   Digit Composite Flexion WNL WNL  Digit Composite Extension WNL WNL  Digit Opposition WNL WNL  (Blank rows = not tested)  UPPER EXTREMITY MMT:     MMT Right (eval) Left (eval)  Shoulder flexion WNL WNL  Shoulder abduction WNL WNL  Elbow flexion WNL WNL  Elbow extension WNL WNL  (Blank rows = not tested)  HAND FUNCTION: Grip strength: Right: 58.6 lbs; Left: 55.3  lbs  COORDINATION: Finger Nose Finger test: WNL 9 Hole Peg test: Right: 31.28 sec; Left: 31.32 sec  SENSATION: WFL  EDEMA: WNL  MUSCLE TONE: RUE: Within functional limits and LUE: Within functional limits  COGNITION: Overall cognitive status: Within functional limits for tasks assessed  VISION: Subjective report: wears outdated progressive lenses; states vision is blurrier since his stroke; denies diplopia; floaters at baseline  Baseline vision: Bifocals, Wears glasses all the time, Wears glasses for distance only, and Wears glasses for reading only Visual history:  none reported  VISION ASSESSMENT: Able to read clock on wall and paper without error  PERCEPTION: WFL  PRAXIS: Not tested  OBSERVATIONS: Pt appears well kept with glasses donned. Demonstrates ability to don and doff jacket with increased time. Ambulates independently from lobby to therapy gym and back.   07/15/2022 - Pt wearing short sleeve button up shirt to therapy visit.  TODAY'S TREATMENT:            07/18/22 Reviewed patient's homework - handwriting skills.  Tracing letters, and copying paragraph from magazine.   100% legible.  Worked on functional hand skills per plan of care.  Patient able to cut with scissors - clearly more left hand dominant, although able to complete with RUE with cueing to reduce excessive wrist flexion.   Patient mentioning fatigue - napping daily 1-2 hours in which he actually sleeps despite sleeping 10 hours at night - patient feels it may be medication related - will see MD tomorrow, encouraged him to discuss.  Patient appears to be doing well functionally from motoric standpoint - anticipate he will meet OT goals within next few sessions.  Eager for SLP evaluation next week.      - Neuro re-education completed for duration as noted below including:                                                                                                                    Reviewed BUE  coordination HEP  OT initiated green theraputty exercises (search, grip, and pinch) as noted in patient instructions for coordination and strength  - Self-care/home management completed for duration as noted below including: OT educated pt to complete buttoning of shirt while in front of mirror to use vision to compensate for coordination limtations. Pt able to return demonstration to button and unbotton smaller buttons at top of shirt collar.  OT educated pt on use of lined, tracing paper to help improve BUE handwriting. Additional handouts provided.   PATIENT EDUCATION: Education details: HEP Person educated:  Patient Education method: Explanation and Handouts Education comprehension: verbalized understanding and needs further education  HOME EXERCISE PROGRAM: 12/5 (eval) - BUE coordination  12/11 - BUE green putty    GOALS:  SHORT TERM GOALS: Target date: 08/06/2022   Patient will be independent with BUE coordination HEP. Baseline: Goal status: INITIAL  2.  Patient will report at least 1-point increase in average PSFS score or at least 2-point increase in a single activity score indicating functionally significant improvement given minimum detectable change. Baseline: total score-3; see objective section for further details Goal status: MET - 12/18 - 6 average (7 pt increase in showering)  LONG TERM GOALS: Target date: 10/04/2022  Patient will demonstrate updated BUE HEP with 25% verbal cues or less for proper execution. Baseline:  Goal status: INITIAL  2.  Patient will report at least two-point increase in average PSFS score or at least three-point increase in a single activity score indicating functionally significant improvement given minimum detectable change.  Baseline: total score-3; see objective section for further details Goal status: MET - 12/18 - 6 average (7 pt increase in showering)  3.  Patient will complete nine-hole peg with each hand in 30 seconds or  less. Baseline: R - 31.28 seconds; L - 31.32 seconds Goal status: IN PROGRESS - 12/18 R - 25; L - 34   ASSESSMENT:  CLINICAL IMPRESSION: Patient is a 66 y.o. male who was seen today for occupational therapy evaluation following CVA. Hx includes HTN, DM II, and HLD. Patient currently presents slightly below baseline level of functioning demonstrating functional deficits and impairments as noted below. Pt would benefit from skilled OT services in the outpatient setting to work on impairments as noted below to help pt return to PLOF as able.    PERFORMANCE DEFICITS: in functional skills including ADLs, IADLs, coordination, Fine motor control, and UE functional use, cognitive skills including.   IMPAIRMENTS: are limiting patient from ADLs and IADLs.   CO-MORBIDITIES: may have co-morbidities  that affects occupational performance. Patient will benefit from skilled OT to address above impairments and improve overall function.  REHAB POTENTIAL: Good   PLAN:  OT FREQUENCY: 1x/week  OT DURATION: 8 weeks  PLANNED INTERVENTIONS: self care/ADL training, therapeutic exercise, therapeutic activity, neuromuscular re-education, patient/family education, DME and/or AE instructions, and Re-evaluation  RECOMMENDED OTHER SERVICES: none at this time  CONSULTED AND AGREED WITH PLAN OF CARE: Patient  PLAN FOR NEXT SESSION: check goals and discharge as appropriate  Antony Salmon, OTR/L 07/22/22 2:06 PM Phone: [phone] Fax: [phone]

## 2022-07-22 NOTE — Therapy (Signed)
OUTPATIENT PHYSICAL THERAPY NEURO EVALUATION   Patient Name: Mike [name] MRN: [id number] DOB:Oct 18, 1955, 66 y.o., male Today's Date: 07/22/2022   PCP: Reece Leader, DO REFERRING PROVIDER: Carney Living, MD  END OF SESSION:  PT End of Session - 07/22/22 1448     Visit Number 1    Authorization Type MEDICARE PART A AND B    PT Start Time 1441   handoff from OT   PT Stop Time 1509    PT Time Calculation (min) 28 min    Activity Tolerance Patient tolerated treatment well    Behavior During Therapy WFL for tasks assessed/performed             Past Medical History:  Diagnosis Date   Blood disorder    Diabetes ([facility])    High blood pressure    High cholesterol    Kidney stones    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    Past Surgical History:  Procedure Laterality Date   CORONARY ANGIOPLASTY WITH STENT PLACEMENT     VENA CAVA FILTER PLACEMENT     Patient Active Problem List   Diagnosis Date Noted   Hyperlipidemia associated with type 2 diabetes mellitus ([facility]) 06/16/2022   Stroke ([facility]) 06/15/2022   Hypertension 06/15/2022   Diabetes ([facility]) 06/15/2022    ONSET DATE: 06/17/2022 (referral date)  REFERRING DIAG: I63.9 (ICD-10-CM) - Cerebrovascular accident (CVA), unspecified mechanism ([facility])  THERAPY DIAG:  Other abnormalities of gait and mobility - Plan: PT plan of care cert/re-cert  Muscle weakness (generalized) - Plan: PT plan of care cert/re-cert  Rationale for Evaluation and Treatment: Rehabilitation  SUBJECTIVE:                                                                                                                                                                                             SUBJECTIVE STATEMENT: "I am not having trouble walking, it's my speech." Pt accompanied by:  took the bus  PERTINENT HISTORY: HTN, HLD, T2DM, Chronic systolic CHF w/ REF, MI in 2014  came to the hospital with a 1 day history of left-sided weakness, left facial droop, and  dysarthria on presentation  PAIN:  Are you having pain? No-was having soreness in muscles of throat 2 days ago  PRECAUTIONS: Fall  WEIGHT BEARING RESTRICTIONS: No  FALLS: Has patient fallen in last 6 months? Yes. Number of falls fell w/ stroke onset  LIVING ENVIRONMENT: Lives with: lives alone Lives in: House/apartment Stairs: Yes: External: 4-5 steps; none Has following equipment at home: Dan Humphreys - 2 wheeled  PLOF: Independent  PATIENT GOALS: "No, I'm waiting for my voice."  OBJECTIVE:  DIAGNOSTIC FINDINGS: CT and MRI showed lacunar infarct in the right corona radiata and lentiform with no hemorrhage  COGNITION: Overall cognitive status: Within functional limits for tasks assessed   SENSATION: WFL-feels symmetrical per report  COORDINATION: LE RAMS:  WNL Heel-to-shin:  WFL bilaterally  EDEMA:  None noted in BLE  MUSCLE TONE: none noted in BLE   POSTURE: No Significant postural limitations  LOWER EXTREMITY ROM:     Active  Right Eval Left Eval  Hip flexion [facility]  Hip extension    Hip abduction    Hip adduction    Hip internal rotation    Hip external rotation    Knee flexion " "  Knee extension " "  Ankle dorsiflexion " "  Ankle plantarflexion    Ankle inversion    Ankle eversion     (Blank rows = not tested)  LOWER EXTREMITY MMT:    MMT Right Eval Left Eval  Hip flexion 4+/5 4+/5  Hip extension    Hip abduction    Hip adduction    Hip internal rotation    Hip external rotation    Knee flexion " "  Knee extension " "  Ankle dorsiflexion 5/5 5/5  Ankle plantarflexion    Ankle inversion    Ankle eversion    (Blank rows = not tested)  BED MOBILITY:  Sit to supine Complete Independence Supine to sit Complete Independence  TRANSFERS: Assistive device utilized: None  Sit to stand: Complete Independence Stand to sit: Complete Independence Chair to chair: Complete Independence Floor: Complete Independence-performed floor recovery  independently following CVA per report  STAIRS: Level of Assistance: Complete Independence Stair Negotiation Technique: Alternating Pattern  Forwards with No Rails Number of Stairs: 8  Height of Stairs: 6"  Comments: No LOB.  GAIT: Gait pattern: WFL Distance walked: 13' + various clinic distances during testing. Assistive device utilized: None Level of assistance: Complete Independence Comments: Pt ambulates w/ normal stride, cadence, and weight shifting, no listing, LOB or abnormal posturing noted.  Good LE clearance bilaterally.  FUNCTIONAL TESTS:  5 times sit to stand: 11.62 sec w/o UE support 10 meter walk test: 7.38sec = 1.36 m/sec OR 4.47 ft/sec Functional gait assessment: 28/30  [facility] PT Assessment - 07/22/22 1505       Functional Gait  Assessment   Gait assessed  Yes    Gait Level Surface Walks 20 ft in less than 5.5 sec, no assistive devices, good speed, no evidence for imbalance, normal gait pattern, deviates no more than 6 in outside of the 12 in walkway width.    Change in Gait Speed Able to smoothly change walking speed without loss of balance or gait deviation. Deviate no more than 6 in outside of the 12 in walkway width.    Gait with Horizontal Head Turns Performs head turns smoothly with no change in gait. Deviates no more than 6 in outside 12 in walkway width    Gait with Vertical Head Turns Performs head turns with no change in gait. Deviates no more than 6 in outside 12 in walkway width.    Gait and Pivot Turn Pivot turns safely within 3 sec and stops quickly with no loss of balance.    Step Over Obstacle Is able to step over 2 stacked shoe boxes taped together (9 in total height) without changing gait speed. No evidence of imbalance.    Gait with Narrow Base of Support Ambulates 7-9 steps.    Gait with Eyes Closed  Walks 20 ft, uses assistive device, slower speed, mild gait deviations, deviates 6-10 in outside 12 in walkway width. Ambulates 20 ft in less than 9 sec  but greater than 7 sec.    Ambulating Backwards Walks 20 ft, no assistive devices, good speed, no evidence for imbalance, normal gait    Steps Alternating feet, no rail.    Total Score 28             PATIENT SURVEYS:  FOTO Not captured at intake.  TODAY'S TREATMENT:                                                                                                                              DATE: N/A    PATIENT EDUCATION: Education details: PT POC and assessment outcomes. Person educated: Patient Education method: Explanation Education comprehension: verbalized understanding  HOME EXERCISE PROGRAM: Not established.  Discussed regular walking routine 10-20 mins 6-7 days a week for cardiovascular fitness.  ASSESSMENT:  CLINICAL IMPRESSION: Patient is a 66 y.o. male who was seen today for physical therapy evaluation and treatment for CVA.  Pt has a significant PMH of HTN, HLD, T2DM, Chronic systolic CHF w/ REF, and MI in 2014.  No significant identified impairments from PT perspective.  Evaluation via the following assessment tools: 5xSTS, , and FGA do not indicate elevated fall risk.  At this time, pt does not require skilled PT services and is in agreement to plan.  CLINICAL DECISION MAKING: Stable/uncomplicated  EVALUATION COMPLEXITY: Low  PLAN:  PT FREQUENCY: one time visit  PT DURATION: other: 1x visit  Sadie Haber, PT, DPT 07/22/2022, 4:27 PM

## 2022-07-22 NOTE — Patient Instructions (Signed)
Upper Body Strengthening Exercises  Comments  Sit upright, away from the back of your chair. Keep abdominal muscles engaged i.e. pull belly button to spine.  Repeat 10 Times  Hold 3 Seconds  Complete 1 Set  Perform 2 Times a Day   FOR ALL EXERCISES:   ELASTIC BAND FLEXION   Place unaffected arm on your leg or hip. With your affected arm, hold elastic band in front of you and pull the band upward towards the ceiling as shown.       ELASTIC BAND HORIZONTAL ABDUCTION - SCAPULAR RETRACTION  Start by holding elastic band in front of your chest with your elbows straight. Then, pull your arms apart and towards the side while squeezing your shoulder blades together. Return to starting position and repeat.            ELASTIC BAND TRICEPS EXTENSION  While seated, hold and fixate one end of an elastic band against your chest. Hold the other end with your opposite hand with your elbow bent and arm by your side.   Start by pulling the band downward so that the elbow goes from a bent position to a straightened position as shown. Return to starting position and repeat.                    BICEPS CURL WITH BAND  While sitting in an upright position, put an elastic band underneath your feet (as pictured)  OR underneath your thighs. Grip each end of the band, keep the elbows tucked to the body, and bring hands to shoulders by bending at the elbows.                    

## 2022-07-24 ENCOUNTER — Encounter: Payer: Self-pay | Admitting: Speech Pathology

## 2022-07-24 ENCOUNTER — Ambulatory Visit: Payer: Medicare Other | Admitting: Speech Pathology

## 2022-07-24 DIAGNOSIS — R29818 Other symptoms and signs involving the nervous system: Secondary | ICD-10-CM | POA: Diagnosis not present

## 2022-07-24 DIAGNOSIS — R471 Dysarthria and anarthria: Secondary | ICD-10-CM

## 2022-07-24 NOTE — Patient Instructions (Addendum)
   Hum MMMMMMM MMMMMMMA MMMMMMME MMMMMMMAY MMMMMMMOW MMMMMMMOOO  MMMMMAKE MMMME MMMMMONNEY  MMMMY MMMMAMMMMA'S MMMMMITTENS  MMMMY MMMMAMMMMA MMMMMAKES MMMMME MMMMUFINS  Read aloud focusing on volume  Practice your name with volume and making each sound and pausing between your first name and last name  Make each sound, make the "F" distinct  Same with your birthday - add pauses 1   1    [birth year]  The Number 9    with diet Coke     please - practice slow and loud

## 2022-07-24 NOTE — Therapy (Signed)
OUTPATIENT SPEECH LANGUAGE PATHOLOGY TREATMENT NOTE   Patient Name: Mike [name] MRN: [id number] DOB:[birth year], 66 y.o., male Today's Date: 07/24/2022  PCP: Reece Leader DO REFERRING PROVIDER: Terisa Starr MD  END OF SESSION:   End of Session - 07/24/22 1414     Visit Number 2    Number of Visits 16    Date for SLP Re-Evaluation 09/16/22    Authorization Type Medicare    Progress Note Due on Visit 10    SLP Start Time 1400    SLP Stop Time  1445    SLP Time Calculation (min) 45 min    Activity Tolerance Patient tolerated treatment well             Past Medical History:  Diagnosis Date   Blood disorder    Diabetes ([facility])    High blood pressure    High cholesterol    Kidney stones    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    Past Surgical History:  Procedure Laterality Date   CORONARY ANGIOPLASTY WITH STENT PLACEMENT     VENA CAVA FILTER PLACEMENT     Patient Active Problem List   Diagnosis Date Noted   Hyperlipidemia associated with type 2 diabetes mellitus ([facility]) 06/16/2022   Stroke ([facility]) 06/15/2022   Hypertension 06/15/2022   Diabetes ([facility]) 06/15/2022    ONSET DATE: 06-15-22  REFERRING DIAG: 163.9 (ICD-10-CM) Cerebrovascular accident (CVA) unspecified mechanism ([facility])  THERAPY DIAG:  Dysarthria and anarthria  Rationale for Evaluation and Treatment: Rehabilitation  SUBJECTIVE:   SUBJECTIVE STATEMENT:  PAIN:  Are you having pain? No  OBJECTIVE:   TODAY'S TREATMENT:                                                                                                                                         DATE:   07-24-22: Ongoing training for HEP for voice/dysarthria - [name] required usual mod verbal and visual cues and modeling for breath support and volume on sustained phonation and pitch glides. Initiated resonant voice therapy (see patient instructions) with improved vocal quality at cv level using extended /m/ and at phrase level. Trained pt in slow rate, over  articulation and pausing to improve intelligibility, especially over the phone as others have difficulty understanding name and birth date. With usual min modeling, cues to emphasize "f" in [name] and pause between last and 1st name and pause after each number in his birthday and order a [place], [name] carried over these strategies in 3 role playing tasks.   07-22-22: Education provided on evaluation results and SLP's recommendations. Pt verbalizes agreement with POC. Initiated training re: vocal function exercises. SLP provided usual modeling and usual max-A for pt to demonstrate accurate completion of warm up, stretch, contraction, and sustained phonation exercises. Initiated HEP.    PATIENT EDUCATION: Education details: see above Person educated: Patient Education method: Explanation, Demonstration, Verbal cues, and Handouts Education comprehension: verbalized  understanding, returned demonstration, verbal cues required, and needs further education     GOALS: Goals reviewed with patient? Yes   SHORT TERM GOALS: Target date: 08/19/2022   Pt will report daily HEP completion (BID recommended) over 1 week period Baseline: Goal status: INITIAL   2.  Pt will carryover compensations for dysarthria 18/20 sentences  Baseline:  Goal status: INITIAL   3.  Pt will maintain 70+ dB in paragraph oral reading  Baseline:  Goal status: INITIAL   4.  Pt will demonstrate voice strengthening exercises with usual min-A over 2 sessions Baseline:  Goal status: INITIAL     LONG TERM GOALS: Target date: 09/16/2022   Pt will complete vocal exercises mod I  Baseline:  Goal status: INITIAL   2.  Pt will be 90% intelligible over 15 minute conversation in mildly noisy environment Baseline:  Goal status: INITIAL   3.  Pt will report improved subjective perception of communication efficacy via PROM  Baseline:  Goal status: INITIAL   ASSESSMENT:   CLINICAL IMPRESSION: Patient is a 66 y.o. M who was  seen today for voice and motor speech evalution s/p stroke. Pt presenting with mild dysarthria vs dysphonia. SLP recommends referring provider consider ENT referral to assess for any abnormalities which may be impacting pt's perceptual voice presentation. Voice is c/b low vocal intensity, aphonia and breathiness on sustained phonation tasks, and overall reduced strength which does negatively impact intelligibility. Articulation, motor planning, resonance, prosody all within gross normal limits. SLP trialed vocal function exercises to assess if pt is able to generate increased power and clarity in voicing. With usual max-A, mild improvement observed with pt elevating conversational average volume to 68 dB. Skilled ST is indicated to optimize pt's communication efficacy.    OBJECTIVE IMPAIRMENTS: include dysarthria and voice disorder. These impairments are limiting patient from effectively communicating at home and in community. Factors affecting potential to achieve goals and functional outcome are medical prognosis(?). Patient will benefit from skilled SLP services to address above impairments and improve overall function.   REHAB POTENTIAL: Fair (depending on underlying etiology for dysphonia)   PLAN:   SLP FREQUENCY: 2x/week   SLP DURATION: 8 weeks   PLANNED INTERVENTIONS: Cueing hierachy, Internal/external aids, Functional tasks, SLP instruction and feedback, Compensatory strategies, and Patient/family education        Alice Rieger Annye Rusk, CCC-SLP 07/24/2022, 3:03 PM

## 2022-07-24 NOTE — Progress Notes (Unsigned)
Patient: Mike [name] Date of Birth: November 14, 1955  Reason for Visit: Stroke Clinic Follow-Up  History from: Patient Primary Neurologist: Dr. Pearlean Brownie (Saw Dr. Roda Shutters)  ASSESSMENT AND PLAN 66 y.o. year old male   1.  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  Hypertension -BP goal less than 130/90 -On metoprolol, Entresto -Has follow-up with cardiology, and EF 30 to 35%  3.  Hyperlipidemia -LDL 218, goal less than 70 -On Lipitor, will need repeat labs next PCP visit  4.  Type 2 diabetes -A1c 10.4, goal less than 7.0 -On metformin, continue to work with PCP, needs repeat A1c  I will see him back in 6 months or sooner if needed.  He will continue to work closely with PCP for management of vascular risk factors specifically targeting improved LDL and A1c.  HISTORY OF PRESENT ILLNESS: Today 07/25/22 Mike [name] here today for stroke clinic follow-up.  Presented 06/15/22 with left-sided weakness and facial droop, imbalance, falls. He called his brother, they went to the hospital. He lives alone. Found to have acute lacunar infarct in the right corona radiata and lentiform.  EF was 30 to 35%, had outpatient follow-up 07/11/22 with Dr. Anne Fu, unclear if EF is chronic.  Started on Toprol, Entresto, recheck echo in 3 months. He feels he is getting better but slowly, speech is weak, mild dysarthria. Remains in ST. Swallowing is fine. Left side is back to normal, balance is good. Other than speech feels back to normal. Remains on aspirin, Plavix, Lipitor. He is retired. Didn't take his medications. He has slightly left sided mouth asymmetry.   -Code stroke CT head lacunar infarct to the right corona radiata and lentiform -CTA head and neck no LVO,  distal right MCA M1 bifurcation with near occlusion of the anterior M2 division.  Moderate stenosis of left PCA distal P2 and P3 segments.  This could be thromboembolic disease rather than intracranial atherosclerosis. -MRI of the brain acute lacunar infarct in the right corona radiata and lentiform -2D echo EF 30 to 35% -LDL 218 -A1c 10.4 -No antithrombotic prior to admission, aspirin 325 mg daily and Plavix 75 mg daily for 3 months then aspirin alone given intracranial stenosis.  HISTORY  Copied 06/15/22 Dr. Derry Lory: Mike [name] is a 66 y.o. male with PMH significant for HTN, DM2, HLD, kidney stones, CAD who presents with L sided weakness.   Last night 2300, was watching TV, got up but felt off balance and fell. Left leg was worse than right. Also noted dropping objects from the left hand. This AM, fell again. Called family and family drove him to the ED. Enroute, he developed a L facial droop. On arrival to ED, stroke code activated.   LKW: 2300 on 06/14/22 mRS: 0 tNKASE: not offered, outside window Thrombectomy: not offered, low suspicion for LVO  REVIEW OF SYSTEMS: Out of a complete 14 system review of symptoms, the patient complains only of the following symptoms, and all other reviewed systems are negative.  See HPI  ALLERGIES: No Known Allergies  HOME MEDICATIONS: Outpatient Medications Prior to Visit  Medication Sig Dispense Refill   aspirin EC 325 MG tablet Take 1 tablet (325 mg total) by mouth daily. 90 tablet 1   atorvastatin (LIPITOR) 80 MG tablet Take 1 tablet (80 mg  total) by mouth daily. 90 tablet 1   clopidogrel (PLAVIX) 75 MG tablet Take 1 tablet (75 mg total) by mouth daily. 90 tablet 0   metFORMIN (GLUCOPHAGE-XR) 500 MG 24 hr tablet Take 1 tablet (500 mg total) by mouth in the morning and at bedtime. 90 tablet 1   metoprolol succinate (TOPROL-XL) 25 MG 24 hr tablet Take 1 tablet (25 mg total) by mouth daily. 90 tablet 3   sacubitril-valsartan (ENTRESTO) 24-26  MG Take 1 tablet by mouth 2 (two) times daily. (Patient taking differently: Take 0.5 tablets by mouth 2 (two) times daily.) 60 tablet 6   No facility-administered medications prior to visit.    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     PAST SURGICAL HISTORY: Past Surgical History:  Procedure Laterality Date   CORONARY ANGIOPLASTY WITH STENT PLACEMENT     VENA CAVA FILTER PLACEMENT      FAMILY HISTORY: Family History  Problem Relation Age of Onset   Liver cancer Father    High blood pressure Mother    Ovarian cancer Mother     SOCIAL HISTORY: Social History   Socioeconomic History   Marital status: Legally Separated    Spouse name: Not on file   Number of children: Not on file   Years of education: Not on file   Highest education level: Not on file  Occupational History   Not on file  Tobacco Use   Smoking status: Never   Smokeless tobacco: Not on file  Substance and Sexual Activity   Alcohol use: No   Drug use: No   Sexual activity: Not on file  Other Topics Concern   Not on file  Social History Narrative   Diet: Low Carb   Caffeine: 1 cup of coffee daily   Marital Status: Separated, married 1992   Lives in house, 1 stories, 3 persons, no pets   Current/Past profession: Naval architect    Exercise: No   Living Will: No    DNR- question mark   POA/HPOA- No as of 09/06/14   Social Determinants of Health   Financial Resource Strain: Not on file  Food Insecurity: Not on file  Transportation Needs: Not on file  Physical Activity: Not on file  Stress: Not on file  Social Connections: Not on file  Intimate Partner Violence: Not on file    PHYSICAL EXAM  Vitals:   07/25/22 0748  BP: (!) 155/88  Pulse: 71  Weight: 155 lb 8 oz (70.5 kg)  Height: 5\' 6"  (1.676 m)   Body mass index is 25.1  kg/m.  Generalized: Well developed, in no acute distress, well-dressed Neurological examination  Mentation: Alert oriented to time, place, history taking. Follows all commands, vocal quality is soft, mild dysarthria, not aphasic.  Very pleasant Cranial nerve II-XII: Pupils were equal round reactive to light. Extraocular movements were full, visual field were full on confrontational test. Facial sensation and strength were normal. Head turning and shoulder shrug  were normal and symmetric.  Mild asymmetry to left mouth with smile Motor: The motor testing reveals 5 over 5 strength of all 4 extremities. Good symmetric motor tone is noted throughout.  Sensory: Sensory testing is intact to soft touch on all 4 extremities. No evidence of extinction is noted.  Coordination: Cerebellar testing  reveals good finger-nose-finger and heel-to-shin bilaterally.  Gait and station: Gait is normal. Tandem gait is normal.  Reflexes: Deep tendon reflexes are symmetric and normal bilaterally.   DIAGNOSTIC DATA (LABS, IMAGING, TESTING) - I reviewed patient records, labs, notes, testing and imaging myself where available.  Lab Results  Component Value Date   WBC 8.1 06/16/2022   HGB 15.4 06/16/2022   HCT 45.7 06/16/2022   MCV 88.7 06/16/2022   PLT 244 06/16/2022      Component Value Date/Time   NA 140 07/19/2022 1351   K 4.3 07/19/2022 1351   CL 104 07/19/2022 1351   CO2 23 07/19/2022 1351   GLUCOSE 212 (H) 07/19/2022 1351   GLUCOSE 243 (H) 06/16/2022 0300   BUN 17 07/19/2022 1351   CREATININE 1.07 07/19/2022 1351   CALCIUM 9.5 07/19/2022 1351   PROT 6.7 06/16/2022 0300   ALBUMIN 3.5 06/16/2022 0300   AST 21 06/16/2022 0300   ALT 25 06/16/2022 0300   ALKPHOS 98 06/16/2022 0300   BILITOT 0.5 06/16/2022 0300   GFRNONAA >60 06/16/2022 0300   GFRAA >90 11/13/2014 1118   Lab Results  Component Value Date   CHOL 277 (H) 06/15/2022   HDL 31 (L) 06/15/2022   LDLCALC NOT CALCULATED 06/15/2022    LDLDIRECT 218 (H) 06/16/2022   TRIG 135 06/15/2022   CHOLHDL 8.9 06/15/2022   Lab Results  Component Value Date   HGBA1C 10.4 (H) 06/16/2022   Lab Results  Component Value Date   VITAMINB12 218 06/16/2022   Lab Results  Component Value Date   TSH 1.535 06/15/2022    Margie Ege, AGNP-C, DNP 07/25/2022, 8:28 AM [facility] [address] [place], [place] [address] [phone]

## 2022-07-25 ENCOUNTER — Encounter: Payer: Self-pay | Admitting: Neurology

## 2022-07-25 ENCOUNTER — Ambulatory Visit (INDEPENDENT_AMBULATORY_CARE_PROVIDER_SITE_OTHER): Payer: Medicare Other | Admitting: Neurology

## 2022-07-25 ENCOUNTER — Ambulatory Visit: Payer: Medicare Other | Admitting: Occupational Therapy

## 2022-07-25 VITALS — BP 155/88 | HR 71 | Ht 66.0 in | Wt 155.5 lb

## 2022-07-25 DIAGNOSIS — I10 Essential (primary) hypertension: Secondary | ICD-10-CM

## 2022-07-25 DIAGNOSIS — E785 Hyperlipidemia, unspecified: Secondary | ICD-10-CM

## 2022-07-25 DIAGNOSIS — I63431 Cerebral infarction due to embolism of right posterior cerebral artery: Secondary | ICD-10-CM

## 2022-07-25 DIAGNOSIS — E1169 Type 2 diabetes mellitus with other specified complication: Secondary | ICD-10-CM | POA: Diagnosis not present

## 2022-07-25 NOTE — Patient Instructions (Signed)
Work on vascular risk factors: Keep BP < 130/90, LDL < 70, A1C < 7.0 Healthy eating ,exercise Continue to see your primary care doctor, will need recheck of A1C and cholesterol  Continue aspirin and plavix for 3 months then just aspirin alone I will see you back in 6 months

## 2022-07-25 NOTE — Progress Notes (Signed)
I agree with the above plan 

## 2022-07-31 ENCOUNTER — Ambulatory Visit: Payer: Medicare Other | Admitting: Speech Pathology

## 2022-07-31 DIAGNOSIS — R490 Dysphonia: Secondary | ICD-10-CM

## 2022-07-31 DIAGNOSIS — R471 Dysarthria and anarthria: Secondary | ICD-10-CM

## 2022-07-31 DIAGNOSIS — R29818 Other symptoms and signs involving the nervous system: Secondary | ICD-10-CM | POA: Diagnosis not present

## 2022-07-31 NOTE — Therapy (Signed)
OUTPATIENT SPEECH LANGUAGE PATHOLOGY TREATMENT NOTE   Patient Name: Mike [name] MRN: [id number] DOB:1956-04-04, 66 y.o., male Today's Date: 07/31/2022  PCP: Reece Leader DO REFERRING PROVIDER: Terisa Starr MD  END OF SESSION:   End of Session - 07/31/22 1211     Visit Number 3    Number of Visits 16    Date for SLP Re-Evaluation 09/16/22    Authorization Type Medicare    Progress Note Due on Visit 10    SLP Start Time 1230    SLP Stop Time  1315    SLP Time Calculation (min) 45 min    Activity Tolerance Patient tolerated treatment well             Past Medical History:  Diagnosis Date   Blood disorder    Diabetes (HCC)    High blood pressure    High cholesterol    Kidney stones    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    Past Surgical History:  Procedure Laterality Date   CORONARY ANGIOPLASTY WITH STENT PLACEMENT     VENA CAVA FILTER PLACEMENT     Patient Active Problem List   Diagnosis Date Noted   Hyperlipidemia associated with type 2 diabetes mellitus (HCC) 06/16/2022   Stroke (HCC) 06/15/2022   Hypertension 06/15/2022   Diabetes (HCC) 06/15/2022    ONSET DATE: 06-15-22  REFERRING DIAG: 163.9 (ICD-10-CM) Cerebrovascular accident (CVA) unspecified mechanism (HCC)  THERAPY DIAG:  Dysarthria and anarthria  Dysphonia  Rationale for Evaluation and Treatment: Rehabilitation  SUBJECTIVE:   SUBJECTIVE STATEMENT: "I've been practicing everyday"   PAIN:  Are you having pain? No  OBJECTIVE:   TODAY'S TREATMENT:                                                                                                                                         DATE:   07-31-22: SLP trained pt in semi-occluded vocal tract exercises (SOVTE), using straw in cup of water to provide biofeedback, to optimize vocal fold motion and phonation. Pt demonstrates SOVTE during sustained phonation and pitch glides with usual model from SLP and usual verbal cues to aid in accurate completion.  SLP provides re-education on resonant voice therapy techniques d/t pt with questions regarding how to complete and purpose for. Following, pt able to employ resonant voice with increased vocal intensity and pacing/breathing dysarthria strategies at sentence level with usual mod-A, faded to occasional min-A, in 15/18 trials. Use of voice and dysarthria strategies together was successful in producing improved clarity of speech with perceptual improvement in vocal quality. In response to questions, pt demonstrates carryover of strategies to generative phrase level, in 8/15 trials.   07-24-22: Ongoing training for HEP for voice/dysarthria - Miliano required usual mod verbal and visual cues and modeling for breath support and volume on sustained phonation and pitch glides. Initiated resonant voice therapy (see patient instructions) with improved vocal quality at  cv level using extended /m/ and at phrase level. Trained pt in slow rate, over articulation and pausing to improve intelligibility, especially over the phone as others have difficulty understanding name and birth date. With usual min modeling, cues to emphasize "f" in [place] and pause between last and 1st name and pause after each number in his birthday and order a Mythos, Haleem carried over these strategies in 3 role playing tasks.   07-22-22: Education provided on evaluation results and SLP's recommendations. Pt verbalizes agreement with POC. Initiated training re: vocal function exercises. SLP provided usual modeling and usual max-A for pt to demonstrate accurate completion of warm up, stretch, contraction, and sustained phonation exercises. Initiated HEP.    PATIENT EDUCATION: Education details: see above Person educated: Patient Education method: Programmer, multimedia, Demonstration, Verbal cues, and Handouts Education comprehension: verbalized understanding, returned demonstration, verbal cues required, and needs further education     GOALS: Goals reviewed  with patient? Yes   SHORT TERM GOALS: Target date: 08/19/2022   Pt will report daily HEP completion (BID recommended) over 1 week period Baseline: Goal status: INITIAL   2.  Pt will carryover compensations for dysarthria 18/20 sentences  Baseline:  Goal status: INITIAL   3.  Pt will maintain 70+ dB in paragraph oral reading  Baseline:  Goal status: INITIAL   4.  Pt will demonstrate voice strengthening exercises with usual min-A over 2 sessions Baseline:  Goal status: INITIAL     LONG TERM GOALS: Target date: 09/16/2022   Pt will complete vocal exercises mod I  Baseline:  Goal status: INITIAL   2.  Pt will be 90% intelligible over 15 minute conversation in mildly noisy environment Baseline:  Goal status: INITIAL   3.  Pt will report improved subjective perception of communication efficacy via PROM  Baseline:  Goal status: INITIAL   ASSESSMENT:   CLINICAL IMPRESSION: Patient is a 66 y.o. M who was seen today for voice and motor speech evalution s/p stroke. Pt presenting with mild dysarthria vs dysphonia. SLP recommends referring provider consider ENT referral to assess for any abnormalities which may be impacting pt's perceptual voice presentation. Voice is c/b low vocal intensity, aphonia and breathiness on sustained phonation tasks, and overall reduced strength which does negatively impact intelligibility. Articulation, motor planning, resonance, prosody all within gross normal limits. SLP trialed vocal function exercises to assess if pt is able to generate increased power and clarity in voicing. With usual max-A, mild improvement observed with pt elevating conversational average volume to 68 dB. Skilled ST is indicated to optimize pt's communication efficacy.    OBJECTIVE IMPAIRMENTS: include dysarthria and voice disorder. These impairments are limiting patient from effectively communicating at home and in community. Factors affecting potential to achieve goals and functional  outcome are medical prognosis(?). Patient will benefit from skilled SLP services to address above impairments and improve overall function.   REHAB POTENTIAL: Fair (depending on underlying etiology for dysphonia)   PLAN:   SLP FREQUENCY: 2x/week   SLP DURATION: 8 weeks   PLANNED INTERVENTIONS: Cueing hierachy, Internal/external aids, Functional tasks, SLP instruction and feedback, Compensatory strategies, and Patient/family education        Maia Breslow, CCC-SLP 07/31/2022, 12:29 PM

## 2022-07-31 NOTE — Patient Instructions (Signed)
Meet me, Peter, meet me.  Maura's a pauper, poor Maura. Manny's popping monsters. Never on a Monday.(continue on with the rest of the week) Number one, number two (continue to twenty). Many moaning men--many, many moaning men. Come away, come away, come.  Make room, make room---the king! A mermaid sitting upon a stone, combing and combing her long green hair. My mind is my own. Miles and miles of golden sand, meandering in a lazy motion. "A drum, a drum; MacBeth doth come." Glimmer, glimmer, glitter, gleam. Marry me, marry me, marry me! Calm and smooth as music murmuring. The madman moans at the moon. The mournful whistle of the night train. Swinging and flinging it down. Waltzing together the whole night long. Gliding and floating on wings of song. The bird's on the wing. It's spring. It's spring. The bells bang and clang--ding-dong, ding-dong, ding-dong. 

## 2022-08-01 ENCOUNTER — Encounter: Payer: Medicare Other | Admitting: Occupational Therapy

## 2022-08-07 ENCOUNTER — Ambulatory Visit: Payer: Medicare HMO | Attending: Family Medicine | Admitting: Speech Pathology

## 2022-08-07 DIAGNOSIS — R490 Dysphonia: Secondary | ICD-10-CM

## 2022-08-07 DIAGNOSIS — R471 Dysarthria and anarthria: Secondary | ICD-10-CM | POA: Diagnosis present

## 2022-08-07 NOTE — Therapy (Signed)
OUTPATIENT SPEECH LANGUAGE PATHOLOGY TREATMENT NOTE   Patient Name: Mike [name] MRN: [id number] DOB:18-Feb-1956, 67 y.o., male Today's Date: 08/07/2022  PCP: Mike Dice DO REFERRING PROVIDER: [name]  END OF SESSION:   End of Session - 08/07/22 1400     Visit Number 4    Number of Visits 16    Date for SLP Re-Evaluation 09/16/22    Authorization Type Medicare    Progress Note Due on Visit 10    SLP Start Time 1400    SLP Stop Time  9169    SLP Time Calculation (min) 39 min    Activity Tolerance Patient tolerated treatment well             Past Medical History:  Diagnosis Date   Blood disorder    Diabetes ([place])    High blood pressure    High cholesterol    Kidney stones    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 I feel like sometimes going better but sometimes going back"   PAIN:  Are you having pain? No  OBJECTIVE:   TODAY'S TREATMENT:                                                                                                                                         DATE:   08-07-22: SLP led pt through warm up with use of gargling to optimize flow of exhalated air and place larynx in optimal position at rest. Pt able to complete x5 sets with mod-I. Following worked to carryover [employer] strategies to reading and  generative Buyer, retail. Pt benefiting from usual max-A verbal cues and modeling of targets for lowering pitch to optimize clarity of voice production. Pt able to carryover strategies targeted, resulting in improved perception of vocal quality in reading x3 items and generating x3 additional in 60% of trials with usual mod-A. Clarity evidenced more so during initial trials, with quality of voice degrading with less ability to correct as practice progressed.   07-31-22: SLP trained pt in semi-occluded vocal tract exercises (SOVTE), using straw in cup of water to provide biofeedback, to optimize vocal fold motion and phonation. Pt demonstrates SOVTE during sustained phonation and pitch glides with usual model from SLP and usual verbal cues to aid in accurate  completion. SLP provides re-education on resonant voice therapy techniques d/t pt with questions regarding how to complete and purpose for. Following, pt able to employ resonant voice with increased vocal intensity and pacing/breathing dysarthria strategies at sentence level with usual mod-A, faded to occasional min-A, in 15/18 trials. Use of voice and dysarthria strategies together was successful in producing improved clarity of speech with perceptual improvement in vocal quality. In response to questions, pt demonstrates carryover of strategies to generative phrase level, in 8/15 trials.   07-24-22: Ongoing training for HEP for voice/dysarthria - Mike [name] required usual mod verbal and visual cues and modeling for breath support and volume on sustained phonation and pitch glides. Initiated resonant voice therapy (see patient instructions) with improved vocal quality at cv level using extended /m/ and at phrase level. Trained pt in slow rate, over articulation and pausing to improve intelligibility, especially over the phone as others have difficulty understanding name and birth date. With usual min modeling, cues to emphasize "f" in Mike [name] and pause between  last and 1st name and pause after each number in his birthday and order a Mythos, Mike [name] carried over these strategies in 3 role playing tasks.   07-22-22: Education provided on evaluation results and SLP's recommendations. Pt verbalizes agreement with POC. Initiated training re: vocal function exercises. SLP provided usual modeling and usual max-A for pt to demonstrate accurate completion of warm up, stretch, contraction, and sustained phonation exercises. Initiated HEP.    PATIENT EDUCATION: Education details: see above Person educated: Patient Education method: Consulting civil engineer, Demonstration, Verbal cues, and Handouts Education comprehension: verbalized understanding, returned demonstration, verbal cues required, and needs further education     GOALS: Goals reviewed with patient? Yes   SHORT TERM GOALS: Target date: 08/19/2022   Pt will report daily HEP completion (BID recommended) over 1 week period Baseline: 08-07-22 Goal status: MET   2.  Pt will carryover compensations for dysarthria 18/20 sentences  Baseline: 08-07-22 Goal status: MET   3.  Pt will maintain 70+ dB in paragraph oral reading  Baseline:  Goal status: INITIAL   4.  Pt will demonstrate voice strengthening exercises with usual min-A over 2 sessions Baseline:  Goal status: INITIAL     LONG TERM GOALS: Target date: 09/16/2022   Pt will complete vocal exercises mod I  Baseline:  Goal status: INITIAL   2.  Pt will be 90% intelligible over 15 minute conversation in mildly noisy environment Baseline:  Goal status: INITIAL   3.  Pt will report improved subjective perception of communication efficacy via PROM  Baseline:  Goal status: INITIAL   ASSESSMENT:   CLINICAL IMPRESSION: Patient is a 67 y.o. M who was seen today for voice and motor speech evalution s/p stroke. Pt presenting with mild dysarthria vs dysphonia. SLP recommends referring provider consider ENT referral to assess for any abnormalities which may be  impacting pt's perceptual voice presentation. Voice is c/b low vocal intensity, aphonia and breathiness on sustained phonation tasks, and overall reduced strength which does negatively impact intelligibility. Articulation, motor planning, resonance, prosody all within gross normal limits. SLP trialed vocal function exercises to assess if pt is able to generate increased power and clarity in voicing. With usual max-A, mild improvement observed with pt elevating conversational average volume to 68 dB. Skilled ST is indicated to optimize pt's communication efficacy.    OBJECTIVE IMPAIRMENTS: include dysarthria and voice disorder. These impairments are limiting patient from effectively communicating at home and in community. Factors affecting potential to achieve  goals and functional outcome are medical prognosis(?). Patient will benefit from skilled SLP services to address above impairments and improve overall function.   REHAB POTENTIAL: Fair (depending on underlying etiology for dysphonia)   PLAN:   SLP FREQUENCY: 2x/week   SLP DURATION: 8 weeks   PLANNED INTERVENTIONS: Cueing hierachy, Internal/external aids, Functional tasks, SLP instruction and feedback, Compensatory strategies, and Patient/family education        Su Monks, CCC-SLP 08/07/2022, 2:01 PM

## 2022-08-08 ENCOUNTER — Encounter: Payer: Medicare Other | Admitting: Occupational Therapy

## 2022-08-08 NOTE — Progress Notes (Signed)
Office Visit    Patient Name: Mike [name] Date of Encounter: 08/09/2022  PCP:  [name], [place]  Cardiologist:  Candee Furbish, MD  Advanced Practice Provider:  No care team member to display Electrophysiologist:  None   HPI    Mike [name] is a 67 y.o. male who was recently evaluated for chronic systolic heart failure with an ejection fraction of 30 to 35%, hypertension, hyperlipidemia, kidney stones, diabetes presents today for follow-up appointment.  He was seen in the hospital November 2023 with left-sided weakness, facial droop, secondary to right-sided lacu  Last seen by Dr. Marlou Porch.  Discharged on Plavix. A1c was 10.4  History of MI back in 2014.  3 stents.  Hemoglobinnar Stroke.  An echocardiogram was performed at that time which showed LVEF 33%.  He was last seen 12/23 by Dr. Marlou Porch and at that time slowly getting better.  No shortness of breath, chest pain. He did have spasm on the left arm which happened in his bed at night.  Described it as a pulling sensation.  Today, he states the Delene Loll has "changed the game". He feels much better and his BP is much better control than it ever has been. He does not have any SOB, chest pain, swelling in legs, or dizziness. He feels that he is slowly getting better from a stroke standpoint. Since he is asymptomatic at this time, we did not start spirolactone at this time.   Reports no shortness of breath nor dyspnea on exertion. Reports no chest pain, pressure, or tightness. No edema, orthopnea, PND. Reports no palpitations.   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 Global hypokinesis worse in inferior base. Left ventricular ejection  fraction, by estimation, is 30 to 35%. The left ventricle has moderately  decreased function. The left ventricle demonstrates global hypokinesis.  The left ventricular internal cavity   size was moderately dilated. Left ventricular diastolic parameters are  consistent with Grade I diastolic dysfunction (impaired relaxation).   2. Right ventricular systolic function is normal. The right ventricular  size is normal.   3. The mitral valve is abnormal. No evidence of mitral valve  regurgitation. No evidence of mitral stenosis.   4. The aortic valve is tricuspid. There is mild calcification of the  aortic valve. There is mild thickening of the aortic valve. Aortic valve  regurgitation is trivial. Aortic valve sclerosis is present, with no  evidence of aortic valve stenosis.   5. The inferior vena cava is normal in size with greater than 50%  respiratory variability, suggesting right atrial pressure of 3 mmHg.     EKG:  EKG is not ordered today.    Recent Labs: 06/15/2022: TSH 1.535 06/16/2022: ALT 25; Hemoglobin 15.4; Magnesium 2.1; Platelets 244 07/19/2022: BUN 17; Creatinine, Ser 1.07; Potassium 4.3; Sodium 140  Recent Lipid Panel    Component Value Date/Time   CHOL 277 (H) 06/15/2022 1500   TRIG 135  06/15/2022 1500   HDL 31 (L) 06/15/2022 1500   CHOLHDL 8.9 06/15/2022 1500   VLDL 27 06/15/2022 1500   LDLCALC NOT CALCULATED 06/15/2022 1500   LDLDIRECT 218 (H) 06/16/2022 0904     Home Medications   Current Meds  Medication Sig   aspirin EC 325 MG tablet Take 1 tablet (325 mg total) by mouth daily.   atorvastatin (LIPITOR) 80 MG tablet Take 1 tablet (80 mg total) by mouth daily.   clopidogrel (PLAVIX) 75 MG tablet Take 1 tablet (75 mg total) by mouth daily.   metFORMIN  (GLUCOPHAGE-XR) 500 MG 24 hr tablet Take 1 tablet (500 mg total) by mouth in the morning and at bedtime.   metoprolol succinate (TOPROL-XL) 25 MG 24 hr tablet Take 1 tablet (25 mg total) by mouth daily.   sacubitril-valsartan (ENTRESTO) 24-26 MG Take 1 tablet by mouth 2 (two) times daily. (Patient taking differently: Take 0.5 tablets by mouth 2 (two) times daily.)     Review of Systems      All other systems reviewed and are otherwise negative except as noted above.  Physical Exam    VS:  BP 124/86   Pulse 86   Ht 5\' 6"  (1.676 m)   Wt 150 lb 12.8 oz (68.4 kg)   SpO2 97%   BMI 24.34 kg/m  , BMI Body mass index is 24.34 kg/m.  Wt Readings from Last 3 Encounters:  08/09/22 150 lb 12.8 oz (68.4 kg)  07/25/22 155 lb 8 oz (70.5 kg)  07/19/22 154 lb (69.9 kg)     GEN: Well nourished, well developed, in no acute distress. HEENT: normal. Neck: Supple, no JVD, carotid bruits, or masses. Cardiac: RRR, no murmurs, rubs, or gallops. No clubbing, cyanosis, edema.  Radials/PT 2+ and equal bilaterally.  Respiratory:  Respirations regular and unlabored, clear to auscultation bilaterally. GI: Soft, nontender, nondistended. MS: No deformity or atrophy. Skin: Warm and dry, no rash. Neuro:  Strength and sensation are intact. Psych: Normal affect.  Assessment & Plan    CVA -three months okay to stop the plavix per neuro and then continue asa -asa 325 mg daily , lipitor 80 mg daily, plavix 75 mg daily, metoprolol succinate 25 mg daily, and Entresto 24-26mg  BID  Chronic systolic heart failure -euvolemic on exam today -continue HF medications -repeat echo in 3 months -BMP today  CAD  -no chest pain -possible ischemic CM  -3 prior stents in 2014  Lacunar stroke -continue PT, OT, SLP  Hyperlipidemia -continue lipitor 80 mg  -LDL goal less 55 given stroke and prior MI -LDL 218 11/23 -will need a repeat lipid panel at follow-up   Disposition: Follow up 3 months with Candee Furbish, MD  or APP.  Signed, Elgie Collard, PA-C 08/09/2022, 4:28 PM [facility]

## 2022-08-09 ENCOUNTER — Ambulatory Visit: Payer: Medicare HMO | Attending: Physician Assistant | Admitting: Physician Assistant

## 2022-08-09 ENCOUNTER — Ambulatory Visit: Payer: Medicare HMO | Admitting: Speech Pathology

## 2022-08-09 ENCOUNTER — Encounter: Payer: Self-pay | Admitting: Physician Assistant

## 2022-08-09 VITALS — BP 124/86 | HR 86 | Ht 66.0 in | Wt 150.8 lb

## 2022-08-09 DIAGNOSIS — I2510 Atherosclerotic heart disease of native coronary artery without angina pectoris: Secondary | ICD-10-CM | POA: Diagnosis not present

## 2022-08-09 DIAGNOSIS — I5022 Chronic systolic (congestive) heart failure: Secondary | ICD-10-CM | POA: Diagnosis not present

## 2022-08-09 DIAGNOSIS — E785 Hyperlipidemia, unspecified: Secondary | ICD-10-CM

## 2022-08-09 DIAGNOSIS — I63431 Cerebral infarction due to embolism of right posterior cerebral artery: Secondary | ICD-10-CM

## 2022-08-09 DIAGNOSIS — I639 Cerebral infarction, unspecified: Secondary | ICD-10-CM | POA: Diagnosis not present

## 2022-08-09 DIAGNOSIS — R471 Dysarthria and anarthria: Secondary | ICD-10-CM | POA: Diagnosis not present

## 2022-08-09 DIAGNOSIS — E1169 Type 2 diabetes mellitus with other specified complication: Secondary | ICD-10-CM

## 2022-08-09 DIAGNOSIS — R490 Dysphonia: Secondary | ICD-10-CM

## 2022-08-09 NOTE — Therapy (Signed)
OUTPATIENT SPEECH LANGUAGE PATHOLOGY TREATMENT NOTE   Patient Name: Mike [name] MRN: [id number] DOB:Dec 07, 1955, 67 y.o., male Today's Date: 08/09/2022  PCP: [name] DO REFERRING PROVIDER: [name]  END OF SESSION:   End of Session - 08/09/22 1315     Visit Number 5    Number of Visits 16    Date for SLP Re-Evaluation 09/16/22    Authorization Type Medicare    Progress Note Due on Visit 10    SLP Start Time 1314    SLP Stop Time  1352    SLP Time Calculation (min) 38 min    Activity Tolerance Patient tolerated treatment well             Past Medical History:  Diagnosis Date   Blood disorder    Diabetes ([place])    High blood pressure    High cholesterol    Kidney stones    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    Past Surgical History:  Procedure Laterality Date   CORONARY ANGIOPLASTY WITH [place]     Patient Active Problem List   Diagnosis Date Noted   Hyperlipidemia associated with type 2 diabetes mellitus ([place]) 06/16/2022   Stroke ([place]) 06/15/2022   Hypertension 06/15/2022   Diabetes ([place]) 06/15/2022    ONSET DATE: 06-15-22  REFERRING DIAG: 163.9 (ICD-10-CM) Cerebrovascular accident (CVA) unspecified mechanism ([place])  THERAPY DIAG:  Dysarthria and anarthria  Dysphonia  Rationale for Evaluation and Treatment: Rehabilitation  SUBJECTIVE:   SUBJECTIVE STATEMENT: "I notice I don't have that break in my voice and I'm louder"   PAIN:  Are you having pain? No  OBJECTIVE:   TODAY'S TREATMENT:  08-09-22: Target improved vocal quality and strength through resonant voice and conversational training techniques. Use of biofeedback to optimize pt's ability to stay within desired pitch range which results in improved vocal intensity and clarity with decreased perception of strain. Pt able to carryover to reading of sentences in 50% of trials with mod-I. Given mod-A, able to correct most trials, with rare exceptions. SLP asks  pt to self evaluate, with his subjective evaluation matching SLPs in 80% of trials, which support improving awareness of voice characteristics. Target carryover in longer reading passage. Given preemptive direct instruction to breath and reset pitch if needed, pt demonstrates average 74 dB volume and average 223 Hz with mod-I. Carryover of improved vocal clarity evidenced over 5 minute conversation with occasional cues for breathing and pacing of speech. Pt reports his voice sounds "much better" than when ST was initiated.   08-07-22: SLP led pt through warm up with use of gargling to optimize flow of exhalated air and place larynx in optimal position at rest. Pt able to complete x5 sets with mod-I. Following worked to carryover [employer] strategies to reading and generative [employer], retail. Pt benefiting from usual max-A verbal cues and modeling of targets for lowering pitch to optimize clarity of voice production. Pt able to carryover strategies targeted, resulting in improved perception of vocal quality in reading x3 items and generating x3 additional in 60% of trials with usual mod-A. Clarity evidenced more so during initial trials, with quality of voice degrading with less ability to correct as practice progressed.   07-31-22: SLP trained pt in semi-occluded vocal tract exercises (SOVTE), using straw in cup of water to provide biofeedback, to optimize vocal fold motion and phonation. Pt demonstrates SOVTE during sustained phonation and pitch glides with usual model from SLP  and usual verbal cues to aid in accurate completion. SLP provides re-education on resonant voice therapy techniques d/t pt with questions regarding how to complete and purpose for. Following, pt able to employ resonant voice with increased vocal intensity and pacing/breathing dysarthria strategies at sentence level with usual mod-A, faded to occasional min-A, in 15/18 trials. Use of voice and dysarthria strategies together was  successful in producing improved clarity of speech with perceptual improvement in vocal quality. In response to questions, pt demonstrates carryover of strategies to generative phrase level, in 8/15 trials.   07-24-22: Ongoing training for HEP for voice/dysarthria - [place] required usual mod verbal and visual cues and modeling for breath support and volume on sustained phonation and pitch glides. Initiated resonant voice therapy (see patient instructions) with improved vocal quality at cv level using extended /m/ and at phrase level. Trained pt in slow rate, over articulation and pausing to improve intelligibility, especially over the phone as others have difficulty understanding name and birth date. With usual min modeling, cues to emphasize "f" in [place] and pause between last and 1st name and pause after each number in his birthday and order a [place], [place] carried over these strategies in 3 role playing tasks.   07-22-22: Education provided on evaluation results and SLP's recommendations. Pt verbalizes agreement with POC. Initiated training re: vocal function exercises. SLP provided usual modeling and usual max-A for pt to demonstrate accurate completion of warm up, stretch, contraction, and sustained phonation exercises. Initiated HEP.    PATIENT EDUCATION: Education details: see above Person educated: Patient Education method: Consulting civil engineer, Demonstration, Verbal cues, and Handouts Education comprehension: verbalized understanding, returned demonstration, verbal cues required, and needs further education     GOALS: Goals reviewed with patient? Yes   SHORT TERM GOALS: Target date: 08/19/2022   Pt will report daily HEP completion (BID recommended) over 1 week period Baseline: 08-07-22 Goal status: MET   2.  Pt will carryover compensations for dysarthria 18/20 sentences  Baseline: 08-07-22 Goal status: MET   3.  Pt will maintain 70+ dB in paragraph oral reading  Baseline:  Goal status:  INITIAL   4.  Pt will demonstrate voice strengthening exercises with usual min-A over 2 sessions Baseline:  Goal status: INITIAL     LONG TERM GOALS: Target date: 09/16/2022   Pt will complete vocal exercises mod I  Baseline:  Goal status: INITIAL   2.  Pt will be 90% intelligible over 15 minute conversation in mildly noisy environment Baseline:  Goal status: INITIAL   3.  Pt will report improved subjective perception of communication efficacy via PROM  Baseline:  Goal status: INITIAL   ASSESSMENT:   CLINICAL IMPRESSION: Patient is a 67 y.o. M who was seen today for voice and motor speech evalution s/p stroke. Pt presenting with mild dysarthria vs dysphonia. SLP recommends referring provider consider ENT referral to assess for any abnormalities which may be impacting pt's perceptual voice presentation. Voice is c/b low vocal intensity, aphonia and breathiness on sustained phonation tasks, and overall reduced strength which does negatively impact intelligibility. Articulation, motor planning, resonance, prosody all within gross normal limits. SLP trialed vocal function exercises to assess if pt is able to generate increased power and clarity in voicing. With usual max-A, mild improvement observed with pt elevating conversational average volume to 68 dB. Skilled ST is indicated to optimize pt's communication efficacy.    OBJECTIVE IMPAIRMENTS: include dysarthria and voice disorder. These impairments are limiting patient from effectively communicating at home  and in community. Factors affecting potential to achieve goals and functional outcome are medical prognosis(?). Patient will benefit from skilled SLP services to address above impairments and improve overall function.   REHAB POTENTIAL: Fair (depending on underlying etiology for dysphonia)   PLAN:   SLP FREQUENCY: 2x/week   SLP DURATION: 8 weeks   PLANNED INTERVENTIONS: Cueing hierachy, Internal/external aids, Functional tasks,  SLP instruction and feedback, Compensatory strategies, and Patient/family education        Maia Breslow, CCC-SLP 08/09/2022, 1:52 PM

## 2022-08-09 NOTE — Patient Instructions (Addendum)
Medication Instructions:  Your physician recommends that you continue on your current medications as directed. Please refer to the Current Medication list given to you today.  *If you need a refill on your cardiac medications before your next appointment, please call your pharmacy*   Lab Work: BMP today If you have labs (blood work) drawn today and your tests are completely normal, you will receive your results only by: [place] (if you have MyChart) OR A paper copy in the mail If you have any lab test that is abnormal or we need to change your treatment, we will call you to review the results.   Testing/Procedures: Your physician has requested that you have an echocardiogram in March. Echocardiography is a painless test that uses sound waves to create images of your heart. It provides your doctor with information about the size and shape of your heart and how well your heart's chambers and valves are working. This procedure takes approximately one hour. There are no restrictions for this procedure. Please do NOT wear cologne, perfume, aftershave, or lotions (deodorant is allowed). Please arrive 15 minutes prior to your appointment time.    Follow-Up: At [facility], you and your health needs are our priority.  As part of our continuing mission to provide you with exceptional heart care, we have created designated Provider Care Teams.  These Care Teams include your primary Cardiologist (physician) and Advanced Practice Providers (APPs -  Physician Assistants and Nurse Practitioners) who all work together to provide you with the care you need, when you need it.  Your next appointment:   In March on a date that is after you have completed your echo  The format for your next appointment:   In Person  Provider:   Candee Furbish, MD    Important Information About [place]

## 2022-08-09 NOTE — Patient Instructions (Signed)
Which bear is the most condescending? A pan-duh!  What kind of noise does a witch's vehicle make? Brrrroooom, brrroooom.  My girlfriend keeps accusing me of cheating. She's starting to sound like my wife.  What's brown and sticky? A stick.  Two guys walked into a bar. The third guy ducked.  Did you hear about the actor who broke his leg onstage? He's still in the cast.  How do you get a country girl's attention? A tractor.  What do you call it when a group of apes starts a company? Monkey business.  Why did the pharmacist walk on her tiptoes? She didn't want to wake the sleeping  pills.  I wanted to buy a pair of camouflage pants, but I couldn't find any.  Why are elevator jokes so classic and good? They work on many levels.  I have an inferiority complex, but it's not a very good one.  What do you call a pudgy psychic? A four-chin teller.  I had a date last night and it was perfect. Tomorrow, I'll have a fig.  What did the police officer say to his belly-button? You're under a vest.  My wife asked me to stop singing "[place]" to her. I said "Maybe.Marland KitchenMarland Kitchen"

## 2022-08-10 LAB — BASIC METABOLIC PANEL
BUN/Creatinine Ratio: 9 — ABNORMAL LOW (ref 10–24)
BUN: 10 mg/dL (ref 8–27)
CO2: 24 mmol/L (ref 20–29)
Calcium: 10 mg/dL (ref 8.6–10.2)
Chloride: 105 mmol/L (ref 96–106)
Creatinine, Ser: 1.07 mg/dL (ref 0.76–1.27)
Glucose: 113 mg/dL — ABNORMAL HIGH (ref 70–99)
Potassium: 4.7 mmol/L (ref 3.5–5.2)
Sodium: 144 mmol/L (ref 134–144)
eGFR: 77 mL/min/{1.73_m2} (ref >59)

## 2022-08-13 ENCOUNTER — Encounter: Payer: Medicare Other | Admitting: Occupational Therapy

## 2022-08-14 ENCOUNTER — Ambulatory Visit: Payer: Medicare HMO | Admitting: Speech Pathology

## 2022-08-14 ENCOUNTER — Other Ambulatory Visit: Payer: Self-pay | Admitting: Family Medicine

## 2022-08-14 DIAGNOSIS — R471 Dysarthria and anarthria: Secondary | ICD-10-CM

## 2022-08-14 DIAGNOSIS — R490 Dysphonia: Secondary | ICD-10-CM

## 2022-08-14 NOTE — Patient Instructions (Signed)
I am requesting a referral for ENT evaluation. Dr. Owens Shark may choose any ENT to refer to but a local option is:   [facility]  [address] [place], [place], [place] [address]  [phone]

## 2022-08-14 NOTE — Therapy (Signed)
OUTPATIENT SPEECH LANGUAGE PATHOLOGY TREATMENT NOTE   Patient Name: Mike [name] MRN: [id number] DOB:1955-09-08, 67 y.o., male Today's Date: 08/14/2022  PCP: Mike Leader DO REFERRING PROVIDER: Terisa Starr MD  END OF SESSION:   End of Session - 08/14/22 1232     Visit Number 6    Number of Visits 16    Date for SLP Re-Evaluation 09/16/22    Authorization Type Medicare    Progress Note Due on Visit 10    SLP Start Time 1232    SLP Stop Time  1315    SLP Time Calculation (min) 43 min    Activity Tolerance Patient tolerated treatment well              Past Medical History:  Diagnosis Date   Blood disorder    Diabetes (HCC)    High blood pressure    High cholesterol    Kidney stones    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    Past Surgical History:  Procedure Laterality Date   CORONARY ANGIOPLASTY WITH STENT PLACEMENT     VENA CAVA FILTER PLACEMENT     Patient Active Problem List   Diagnosis Date Noted   Hyperlipidemia associated with type 2 diabetes mellitus (HCC) 06/16/2022   Stroke (HCC) 06/15/2022   Hypertension 06/15/2022   Diabetes (HCC) 06/15/2022    ONSET DATE: 06-15-22  REFERRING DIAG: 163.9 (ICD-10-CM) Cerebrovascular accident (CVA) unspecified mechanism (HCC)  THERAPY DIAG:  Dysarthria and anarthria  Dysphonia  Rationale for Evaluation and Treatment: Rehabilitation  SUBJECTIVE:   SUBJECTIVE STATEMENT: "not so good, I don't see any improvement"   PAIN:  Are you having pain? No  OBJECTIVE:   TODAY'S TREATMENT:  08-14-22: SLP recommends pt reconsider possibility of ENT evaluation to rule out structural differences which may be contributing to pt's ongoing dysphonia. Pt with subjective evaluation of worsening vocal quality. Note that pt does demonstrate perceptual improvement in vocal acoustic during structured practice in sessions but SLP is unable to determine if muscle tension dysphonia is cause of pt's dysphonia without imaging. At this time, ENT is  re-recommended d/t limited improvement noted by pt across communication contexts despite ongoing home practice. Limited progress may also be 2/2 difficulties in carry over of skills targeted in therapy, as pt with usual need for mod to max-A to sustain improved vocal quality. Re-education on need to balance sub-systems of speech (breath, phonation, resonance), with pt demonstrating improvements in perceptual voice quality with more frequent breaths taken. Practiced in oral reading of short story with visual cues provided for increasing breath frequency. Able to demonstrate decreased constriction/tension, evaluated by perceptual improvement in pt's vocal quality. Pt evidencing reduced ability to self-evaluate voice quality. Recommend use of recording to self-assess during home practice.   08-09-22: Target improved vocal quality and strength through resonant voice and conversational training techniques. Use of biofeedback to optimize pt's ability to stay within desired pitch range which results in improved vocal intensity and clarity with decreased perception of strain. Pt able to carryover to reading of sentences in 50% of trials with mod-I. Given mod-A, able to correct most trials, with rare exceptions. SLP asks pt to self evaluate, with his subjective evaluation matching SLPs in 80% of trials, which support improving awareness of voice characteristics. Target carryover in longer reading passage. Given preemptive direct instruction to breath and reset pitch if needed, pt demonstrates average 74 dB volume and average 223 Hz with mod-I. Carryover of improved vocal clarity evidenced over 5 minute conversation with occasional  cues for breathing and pacing of speech. Pt reports his voice sounds "much better" than when ST was initiated.   08-07-22: SLP led pt through warm up with use of gargling to optimize flow of exhalated air and place larynx in optimal position at rest. Pt able to complete x5 sets with mod-I.  Following worked to carryover [employer] strategies to reading and generative [employer]er, retail. Pt benefiting from usual max-A verbal cues and modeling of targets for lowering pitch to optimize clarity of voice production. Pt able to carryover strategies targeted, resulting in improved perception of vocal quality in reading x3 items and generating x3 additional in 60% of trials with usual mod-A. Clarity evidenced more so during initial trials, with quality of voice degrading with less ability to correct as practice progressed.   07-31-22: SLP trained pt in semi-occluded vocal tract exercises (SOVTE), using straw in cup of water to provide biofeedback, to optimize vocal fold motion and phonation. Pt demonstrates SOVTE during sustained phonation and pitch glides with usual model from SLP and usual verbal cues to aid in accurate completion. SLP provides re-education on resonant voice therapy techniques d/t pt with questions regarding how to complete and purpose for. Following, pt able to employ resonant voice with increased vocal intensity and pacing/breathing dysarthria strategies at sentence level with usual mod-A, faded to occasional min-A, in 15/18 trials. Use of voice and dysarthria strategies together was successful in producing improved clarity of speech with perceptual improvement in vocal quality. In response to questions, pt demonstrates carryover of strategies to generative phrase level, in 8/15 trials.   07-24-22: Ongoing training for HEP for voice/dysarthria - Mike required usual mod verbal and visual cues and modeling for breath support and volume on sustained phonation and pitch glides. Initiated resonant voice therapy (see patient instructions) with improved vocal quality at cv level using extended /m/ and at phrase level. Trained pt in slow rate, over articulation and pausing to improve intelligibility, especially over the phone as others have difficulty understanding name and birth date. With  usual min modeling, cues to emphasize "f" in [place] and pause between last and 1st name and pause after each number in his birthday and order a Mythos, [name] carried over these strategies in 3 role playing tasks.   07-22-22: Education provided on evaluation results and SLP's recommendations. Pt verbalizes agreement with POC. Initiated training re: vocal function exercises. SLP provided usual modeling and usual max-A for pt to demonstrate accurate completion of warm up, stretch, contraction, and sustained phonation exercises. Initiated HEP.    PATIENT EDUCATION: Education details: see above Person educated: Patient Education method: Consulting civil engineer, Demonstration, Verbal cues, and Handouts Education comprehension: verbalized understanding, returned demonstration, verbal cues required, and needs further education     GOALS: Goals reviewed with patient? Yes   SHORT TERM GOALS: Target date: 08/19/2022   Pt will report daily HEP completion (BID recommended) over 1 week period Baseline: 08-07-22 Goal status: MET   2.  Pt will carryover compensations for dysarthria 18/20 sentences  Baseline: 08-07-22 Goal status: MET   3.  Pt will maintain 70+ dB in paragraph oral reading  Baseline:  Goal status: INITIAL   4.  Pt will demonstrate voice strengthening exercises with usual min-A over 2 sessions Baseline:  Goal status: INITIAL     LONG TERM GOALS: Target date: 09/16/2022   Pt will complete vocal exercises mod I  Baseline:  Goal status: INITIAL   2.  Pt will be 90% intelligible over 15 minute conversation  in mildly noisy environment Baseline:  Goal status: INITIAL   3.  Pt will report improved subjective perception of communication efficacy via PROM  Baseline:  Goal status: INITIAL   ASSESSMENT:   CLINICAL IMPRESSION: Patient is a 67 y.o. M who was seen today for voice and motor speech evalution s/p stroke. Pt presenting with mild dysarthria vs dysphonia. SLP recommends referring provider  consider ENT referral to assess for any abnormalities which may be impacting pt's perceptual voice presentation. Voice is c/b low vocal intensity, aphonia and breathiness on sustained phonation tasks, and overall reduced strength which does negatively impact intelligibility. Articulation, motor planning, resonance, prosody all within gross normal limits. SLP trialed vocal function exercises to assess if pt is able to generate increased power and clarity in voicing. With usual max-A, mild improvement observed with pt elevating conversational average volume to 68 dB. Skilled ST is indicated to optimize pt's communication efficacy.    OBJECTIVE IMPAIRMENTS: include dysarthria and voice disorder. These impairments are limiting patient from effectively communicating at home and in community. Factors affecting potential to achieve goals and functional outcome are medical prognosis(?). Patient will benefit from skilled SLP services to address above impairments and improve overall function.   REHAB POTENTIAL: Fair (depending on underlying etiology for dysphonia)   PLAN:   SLP FREQUENCY: 2x/week   SLP DURATION: 8 weeks   PLANNED INTERVENTIONS: Cueing hierachy, Internal/external aids, Functional tasks, SLP instruction and feedback, Compensatory strategies, and Patient/family education        Su Monks, CCC-SLP 08/14/2022, 12:32 PM

## 2022-08-15 ENCOUNTER — Encounter: Payer: Medicare Other | Admitting: Occupational Therapy

## 2022-08-16 ENCOUNTER — Ambulatory Visit: Payer: Medicare HMO | Admitting: Speech Pathology

## 2022-08-21 ENCOUNTER — Ambulatory Visit: Payer: Medicare HMO | Admitting: Speech Pathology

## 2022-08-21 DIAGNOSIS — R471 Dysarthria and anarthria: Secondary | ICD-10-CM

## 2022-08-21 DIAGNOSIS — R490 Dysphonia: Secondary | ICD-10-CM

## 2022-08-21 NOTE — Therapy (Signed)
OUTPATIENT SPEECH LANGUAGE PATHOLOGY TREATMENT NOTE   Patient Name: Mike [name] MRN: [id number] DOB:November 20, 1955, 67 y.o., male Today's Date: 08/21/2022  PCP: Reece Leader DO REFERRING PROVIDER: Terisa Starr MD  END OF SESSION:   End of Session - 08/21/22 1355     Visit Number 7    Number of Visits 16    Date for SLP Re-Evaluation 09/16/22    Authorization Type Medicare    Progress Note Due on Visit 10    SLP Start Time 1400    SLP Stop Time  1445    SLP Time Calculation (min) 45 min    Activity Tolerance Patient tolerated treatment well              Past Medical History:  Diagnosis Date   Blood disorder    Diabetes (HCC)    High blood pressure    High cholesterol    Kidney stones    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    Past Surgical History:  Procedure Laterality Date   CORONARY ANGIOPLASTY WITH STENT PLACEMENT     VENA CAVA FILTER PLACEMENT     Patient Active Problem List   Diagnosis Date Noted   Hyperlipidemia associated with type 2 diabetes mellitus (HCC) 06/16/2022   Stroke (HCC) 06/15/2022   Hypertension 06/15/2022   Diabetes (HCC) 06/15/2022    ONSET DATE: 06-15-22  REFERRING DIAG: 163.9 (ICD-10-CM) Cerebrovascular accident (CVA) unspecified mechanism (HCC)  THERAPY DIAG:  Dysarthria and anarthria  Dysphonia  Rationale for Evaluation and Treatment: Rehabilitation  SUBJECTIVE:   SUBJECTIVE STATEMENT: "I've been so-so. I called ENT today and left a message."  PAIN:  Are you having pain? No  OBJECTIVE:   TODAY'S TREATMENT:  08-21-22: Pt is working to schedule ENT appt. SLP reviewed vocal functional and resonant voice exercises, providing models. Pt reported following through on HEP to record and listen to his voice (biofeedback). He independently identified strategies he can use to improve his vocal quality. SLP guided pt to implement vocal strategies while answering simple questions and during discourse level tasks. Pt demonstrated some difficulty  with identifying moments of reduced volume. Clinician provided re-education concerning benefits of behavioral ST services to control vocal quality and speech intelligibility. When counting from 1 to 10, pt produced clear voice over 3 digits before demonstrating reduced clarity. SLP introduced visual aid (white board), after which pt demonstrated improvement in maintaining vocal clarity while counting up to ten across 3 separate trials. In response to questions, pt able to employ perceptually improved voice in 70% of trials, able to correct given model and verbal cueing.   08-14-22: SLP recommends pt reconsider possibility of ENT evaluation to rule out structural differences which may be contributing to pt's ongoing dysphonia. Pt with subjective evaluation of worsening vocal quality. Note that pt does demonstrate perceptual improvement in vocal acoustic during structured practice in sessions but SLP is unable to determine if muscle tension dysphonia is cause of pt's dysphonia without imaging. At this time, ENT is re-recommended d/t limited improvement noted by pt across communication contexts despite ongoing home practice. Limited progress may also be 2/2 difficulties in carry over of skills targeted in therapy, as pt with usual need for mod to max-A to sustain improved vocal quality. Re-education on need to balance sub-systems of speech (breath, phonation, resonance), with pt demonstrating improvements in perceptual voice quality with more frequent breaths taken. Practiced in oral reading of short story with visual cues provided for increasing breath frequency. Able to demonstrate decreased constriction/tension, evaluated  by perceptual improvement in pt's vocal quality. Pt evidencing reduced ability to self-evaluate voice quality. Recommend use of recording to self-assess during home practice.   08-09-22: Target improved vocal quality and strength through resonant voice and conversational training techniques. Use  of biofeedback to optimize pt's ability to stay within desired pitch range which results in improved vocal intensity and clarity with decreased perception of strain. Pt able to carryover to reading of sentences in 50% of trials with mod-I. Given mod-A, able to correct most trials, with rare exceptions. SLP asks pt to self evaluate, with his subjective evaluation matching SLPs in 80% of trials, which support improving awareness of voice characteristics. Target carryover in longer reading passage. Given preemptive direct instruction to breath and reset pitch if needed, pt demonstrates average 74 dB volume and average 223 Hz with mod-I. Carryover of improved vocal clarity evidenced over 5 minute conversation with occasional cues for breathing and pacing of speech. Pt reports his voice sounds "much better" than when ST was initiated.   08-07-22: SLP led pt through warm up with use of gargling to optimize flow of exhalated air and place larynx in optimal position at rest. Pt able to complete x5 sets with mod-I. Following worked to carryover [employer] strategies to reading and generative Buyer, retail. Pt benefiting from usual max-A verbal cues and modeling of targets for lowering pitch to optimize clarity of voice production. Pt able to carryover strategies targeted, resulting in improved perception of vocal quality in reading x3 items and generating x3 additional in 60% of trials with usual mod-A. Clarity evidenced more so during initial trials, with quality of voice degrading with less ability to correct as practice progressed.   07-31-22: SLP trained pt in semi-occluded vocal tract exercises (SOVTE), using straw in cup of water to provide biofeedback, to optimize vocal fold motion and phonation. Pt demonstrates SOVTE during sustained phonation and pitch glides with usual model from SLP and usual verbal cues to aid in accurate completion. SLP provides re-education on resonant voice therapy techniques d/t pt  with questions regarding how to complete and purpose for. Following, pt able to employ resonant voice with increased vocal intensity and pacing/breathing dysarthria strategies at sentence level with usual mod-A, faded to occasional min-A, in 15/18 trials. Use of voice and dysarthria strategies together was successful in producing improved clarity of speech with perceptual improvement in vocal quality. In response to questions, pt demonstrates carryover of strategies to generative phrase level, in 8/15 trials.   07-24-22: Ongoing training for HEP for voice/dysarthria - Brayen required usual mod verbal and visual cues and modeling for breath support and volume on sustained phonation and pitch glides. Initiated resonant voice therapy (see patient instructions) with improved vocal quality at cv level using extended /m/ and at phrase level. Trained pt in slow rate, over articulation and pausing to improve intelligibility, especially over the phone as others have difficulty understanding name and birth date. With usual min modeling, cues to emphasize "f" in [place] and pause between last and 1st name and pause after each number in his birthday and order a [employer], Herbert carried over these strategies in 3 role playing tasks.   07-22-22: Education provided on evaluation results and SLP's recommendations. Pt verbalizes agreement with POC. Initiated training re: vocal function exercises. SLP provided usual modeling and usual max-A for pt to demonstrate accurate completion of warm up, stretch, contraction, and sustained phonation exercises. Initiated HEP.    PATIENT EDUCATION: Education details: see above Person educated: Patient  Education method: Explanation, Demonstration, Verbal cues, and Handouts Education comprehension: verbalized understanding, returned demonstration, verbal cues required, and needs further education     GOALS: Goals reviewed with patient? Yes   SHORT TERM GOALS: Target date: 08/19/2022   Pt  will report daily HEP completion (BID recommended) over 1 week period Baseline: 08-07-22 Goal status: MET   2.  Pt will carryover compensations for dysarthria 18/20 sentences  Baseline: 08-07-22 Goal status: MET   3.  Pt will maintain 70+ dB in paragraph oral reading  Baseline:  Goal status: INITIAL   4.  Pt will demonstrate voice strengthening exercises with usual min-A over 2 sessions Baseline:  Goal status: INITIAL     LONG TERM GOALS: Target date: 09/16/2022   Pt will complete vocal exercises mod I  Baseline:  Goal status: INITIAL   2.  Pt will be 90% intelligible over 15 minute conversation in mildly noisy environment Baseline:  Goal status: INITIAL   3.  Pt will report improved subjective perception of communication efficacy via PROM  Baseline:  Goal status: INITIAL   ASSESSMENT:   CLINICAL IMPRESSION: Patient is a 67 y.o. M who was seen today for voice and motor speech evalution s/p stroke. Pt presenting with mild dysarthria vs dysphonia. SLP recommends referring provider consider ENT referral to assess for any abnormalities which may be impacting pt's perceptual voice presentation. Voice is c/b low vocal intensity, aphonia and breathiness on sustained phonation tasks, and overall reduced strength which does negatively impact intelligibility. Articulation, motor planning, resonance, prosody all within gross normal limits. SLP trialed vocal function exercises to assess if pt is able to generate increased power and clarity in voicing. With usual max-A, mild improvement observed with pt elevating conversational average volume to 68 dB. Skilled ST is indicated to optimize pt's communication efficacy.    OBJECTIVE IMPAIRMENTS: include dysarthria and voice disorder. These impairments are limiting patient from effectively communicating at home and in community. Factors affecting potential to achieve goals and functional outcome are medical prognosis(?). Patient will benefit from  skilled SLP services to address above impairments and improve overall function.   REHAB POTENTIAL: Fair (depending on underlying etiology for dysphonia)   PLAN:   SLP FREQUENCY: 2x/week   SLP DURATION: 8 weeks   PLANNED INTERVENTIONS: Cueing hierachy, Internal/external aids, Functional tasks, SLP instruction and feedback, Compensatory strategies, and Patient/family education        Leroy Libman, Student-SLP 08/21/2022, 2:47 PM

## 2022-08-23 ENCOUNTER — Ambulatory Visit: Payer: Medicare HMO | Admitting: Speech Pathology

## 2022-08-23 DIAGNOSIS — R471 Dysarthria and anarthria: Secondary | ICD-10-CM | POA: Diagnosis not present

## 2022-08-23 DIAGNOSIS — R490 Dysphonia: Secondary | ICD-10-CM

## 2022-08-23 NOTE — Therapy (Signed)
OUTPATIENT SPEECH LANGUAGE PATHOLOGY TREATMENT NOTE   Patient Name: Mike [name] MRN: [id number] DOB:1956-06-01, 67 y.o., male Today's Date: 08/23/2022  PCP: [name] DO REFERRING PROVIDER: [name]  END OF SESSION:   End of Session - 08/23/22 1419     Visit Number 8    Number of Visits 16    Date for SLP Re-Evaluation 09/16/22    Authorization Type Medicare    Progress Note Due on Visit 10    SLP Start Time 1335    SLP Stop Time  1419    SLP Time Calculation (min) 44 min    Activity Tolerance Patient tolerated treatment well              Past Medical History:  Diagnosis Date   Blood disorder    Diabetes ([place])    High blood pressure    High cholesterol    Kidney stones    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    Past Surgical History:  Procedure Laterality Date   CORONARY ANGIOPLASTY WITH [place]     Patient Active Problem List   Diagnosis Date Noted   Hyperlipidemia associated with type 2 diabetes mellitus ([place]) 06/16/2022   Stroke ([place]) 06/15/2022   Hypertension 06/15/2022   Diabetes ([place]) 06/15/2022    ONSET DATE: 06-15-22  REFERRING DIAG: 163.9 (ICD-10-CM) Cerebrovascular accident (CVA) unspecified mechanism ([place])  THERAPY DIAG:  Dysarthria and anarthria  Dysphonia  Rationale for Evaluation and Treatment: Rehabilitation  SUBJECTIVE:   SUBJECTIVE STATEMENT: "I got the ENT appointment, Feb 29"  PAIN:  Are you having pain? No  OBJECTIVE:   TODAY'S TREATMENT:  08-23-22: Pt with persisting throat clears throughout session (x17 total). SLP provided education on throat clear as phonotrauma which is counterproductive for voice clarity. SLP provided education and demonstration for throat clear alternatives. Pt able to employ hard swallow as alternative with verbal cueing from SLP x3. SLP pointed out that pt was able to improve vocal clarity with swallow, pt agreed. Handout provided to A in carryover of  intervention. Reviewed resonant voice and flow phonation strategies, plus alteration of pitch, which have proven to optimize pt's vocal clarity, strength, and volume. Encouraged pt to continue practicing techniques to aid in carryover to conversational speech. HEP should include self-monitoring and biofeedback as needed to optimize adherence to strategies. In targeted practice today, pt able to demonstrated strategy usage, judged by perceptual improvement in vocal quality, in generative speech phrases in 80% of trials given occasional min-A. Pt desires to stay on caseload until ENT visit to ensure to f/u behavioral ST is indicated per ENT findings. F/u visit scheduled.   08-21-22: Pt is working to schedule ENT appt. SLP reviewed vocal functional and resonant voice exercises, providing models. Pt reported following through on HEP to record and listen to his voice (biofeedback). He independently identified strategies he can use to improve his vocal quality. SLP guided pt to implement vocal strategies while answering simple questions and during discourse level tasks. Pt demonstrated some difficulty with identifying moments of reduced volume. Clinician provided re-education concerning benefits of behavioral ST services to control vocal quality and speech intelligibility. When counting from 1 to 10, pt produced clear voice over 3 digits before demonstrating reduced clarity. SLP introduced visual aid (white board), after which pt demonstrated improvement in maintaining vocal clarity while counting up to ten across 3 separate trials. In response to questions, pt able to employ perceptually improved voice in 70%  of trials, able to correct given model and verbal cueing.   08-14-22: SLP recommends pt reconsider possibility of ENT evaluation to rule out structural differences which may be contributing to pt's ongoing dysphonia. Pt with subjective evaluation of worsening vocal quality. Note that pt does demonstrate perceptual  improvement in vocal acoustic during structured practice in sessions but SLP is unable to determine if muscle tension dysphonia is cause of pt's dysphonia without imaging. At this time, ENT is re-recommended d/t limited improvement noted by pt across communication contexts despite ongoing home practice. Limited progress may also be 2/2 difficulties in carry over of skills targeted in therapy, as pt with usual need for mod to max-A to sustain improved vocal quality. Re-education on need to balance sub-systems of speech (breath, phonation, resonance), with pt demonstrating improvements in perceptual voice quality with more frequent breaths taken. Practiced in oral reading of short story with visual cues provided for increasing breath frequency. Able to demonstrate decreased constriction/tension, evaluated by perceptual improvement in pt's vocal quality. Pt evidencing reduced ability to self-evaluate voice quality. Recommend use of recording to self-assess during home practice.   08-09-22: Target improved vocal quality and strength through resonant voice and conversational training techniques. Use of biofeedback to optimize pt's ability to stay within desired pitch range which results in improved vocal intensity and clarity with decreased perception of strain. Pt able to carryover to reading of sentences in 50% of trials with mod-I. Given mod-A, able to correct most trials, with rare exceptions. SLP asks pt to self evaluate, with his subjective evaluation matching SLPs in 80% of trials, which support improving awareness of voice characteristics. Target carryover in longer reading passage. Given preemptive direct instruction to breath and reset pitch if needed, pt demonstrates average 74 dB volume and average 223 Hz with mod-I. Carryover of improved vocal clarity evidenced over 5 minute conversation with occasional cues for breathing and pacing of speech. Pt reports his voice sounds "much better" than when ST was  initiated.   08-07-22: SLP led pt through warm up with use of gargling to optimize flow of exhalated air and place larynx in optimal position at rest. Pt able to complete x5 sets with mod-I. Following worked to carryover [employer] strategies to reading and generative [employer], [employer]. Pt benefiting from usual max-A verbal cues and modeling of targets for lowering pitch to optimize clarity of voice production. Pt able to carryover strategies targeted, resulting in improved perception of vocal quality in reading x3 items and generating x3 additional in 60% of trials with usual mod-A. Clarity evidenced more so during initial trials, with quality of voice degrading with less ability to correct as practice progressed.   07-31-22: SLP trained pt in semi-occluded vocal tract exercises (SOVTE), using straw in cup of water to provide biofeedback, to optimize vocal fold motion and phonation. Pt demonstrates SOVTE during sustained phonation and pitch glides with usual model from SLP and usual verbal cues to aid in accurate completion. SLP provides re-education on resonant voice therapy techniques d/t pt with questions regarding how to complete and purpose for. Following, pt able to employ resonant voice with increased vocal intensity and pacing/breathing dysarthria strategies at sentence level with usual mod-A, faded to occasional min-A, in 15/18 trials. Use of voice and dysarthria strategies together was successful in producing improved clarity of speech with perceptual improvement in vocal quality. In response to questions, pt demonstrates carryover of strategies to generative phrase level, in 8/15 trials.   07-24-22: Ongoing training for HEP  for voice/dysarthria - Issachar required usual mod verbal and visual cues and modeling for breath support and volume on sustained phonation and pitch glides. Initiated resonant voice therapy (see patient instructions) with improved vocal quality at cv level using extended /m/ and  at phrase level. Trained pt in slow rate, over articulation and pausing to improve intelligibility, especially over the phone as others have difficulty understanding name and birth date. With usual min modeling, cues to emphasize "f" in [employer] and pause between last and 1st name and pause after each number in his birthday and order a [employer], R[place] carried over these strategies in 3 role playing tasks.   07-22-22: Education provided on evaluation results and SLP's recommendations. Pt verbalizes agreement with POC. Initiated training re: vocal function exercises. SLP provided usual modeling and usual max-A for pt to demonstrate accurate completion of warm up, stretch, contraction, and sustained phonation exercises. Initiated HEP.    PATIENT EDUCATION: Education details: see above Person educated: Patient Education method: Programmer, multimedia, Demonstration, Verbal cues, and Handouts Education comprehension: verbalized understanding, returned demonstration, verbal cues required, and needs further education     GOALS: Goals reviewed with patient? Yes   SHORT TERM GOALS: Target date: 08/19/2022   Pt will report daily HEP completion (BID recommended) over 1 week period Baseline: 08-07-22 Goal status: MET   2.  Pt will carryover compensations for dysarthria 18/20 sentences  Baseline: 08-07-22 Goal status: MET   3.  Pt will maintain 70+ dB in paragraph oral reading  Baseline: 08-23-22 Goal status: partially met   4.  Pt will demonstrate voice strengthening exercises with usual min-A over 2 sessions Baseline: 08-21-22 Goal status: met     LONG TERM GOALS: Target date: 09/16/2022   Pt will complete vocal exercises mod I  Baseline:  Goal status: INITIAL   2.  Pt will be 90% intelligible over 15 minute conversation in mildly noisy environment Baseline:  Goal status: INITIAL   3.  Pt will report improved subjective perception of communication efficacy via PROM  Baseline:  Goal status: INITIAL    ASSESSMENT:   CLINICAL IMPRESSION: Patient is a 67 y.o. M who was seen today for voice and motor speech evalution s/p stroke. Pt presenting with mild dysarthria vs dysphonia. SLP recommends referring provider consider ENT referral to assess for any abnormalities which may be impacting pt's perceptual voice presentation. Voice is c/b low vocal intensity, aphonia and breathiness on sustained phonation tasks, and overall reduced strength which does negatively impact intelligibility. Articulation, motor planning, resonance, prosody all within gross normal limits. SLP trialed vocal function exercises to assess if pt is able to generate increased power and clarity in voicing. With usual max-A, mild improvement observed with pt elevating conversational average volume to 68 dB. Skilled ST is indicated to optimize pt's communication efficacy.    OBJECTIVE IMPAIRMENTS: include dysarthria and voice disorder. These impairments are limiting patient from effectively communicating at home and in community. Factors affecting potential to achieve goals and functional outcome are medical prognosis(?). Patient will benefit from skilled SLP services to address above impairments and improve overall function.   REHAB POTENTIAL: Fair (depending on underlying etiology for dysphonia)   PLAN:   SLP FREQUENCY: 2x/week   SLP DURATION: 8 weeks   PLANNED INTERVENTIONS: Cueing hierachy, Internal/external aids, Functional tasks, SLP instruction and feedback, Compensatory strategies, and Patient/family education        Maia Breslow, CCC-SLP 08/23/2022, 2:19 PM

## 2022-09-16 ENCOUNTER — Ambulatory Visit (INDEPENDENT_AMBULATORY_CARE_PROVIDER_SITE_OTHER): Payer: Medicare HMO | Admitting: Family Medicine

## 2022-09-16 ENCOUNTER — Encounter: Payer: Self-pay | Admitting: Family Medicine

## 2022-09-16 VITALS — BP 126/79 | HR 61 | Ht 66.0 in | Wt 149.4 lb

## 2022-09-16 DIAGNOSIS — E1122 Type 2 diabetes mellitus with diabetic chronic kidney disease: Secondary | ICD-10-CM | POA: Diagnosis not present

## 2022-09-16 DIAGNOSIS — N181 Chronic kidney disease, stage 1: Secondary | ICD-10-CM

## 2022-09-16 DIAGNOSIS — R4586 Emotional lability: Secondary | ICD-10-CM | POA: Diagnosis not present

## 2022-09-16 DIAGNOSIS — I129 Hypertensive chronic kidney disease with stage 1 through stage 4 chronic kidney disease, or unspecified chronic kidney disease: Secondary | ICD-10-CM | POA: Diagnosis not present

## 2022-09-16 LAB — POCT GLYCOSYLATED HEMOGLOBIN (HGB A1C): HbA1c, POC (controlled diabetic range): 7.3 % — AB (ref 0.0–7.0)

## 2022-09-16 NOTE — Assessment & Plan Note (Signed)
-  BP 126/79, at goal -continue entresto and metoprolol -diet and exercise counseling provided  -renal function appropriate with entresto noted from last BMP

## 2022-09-16 NOTE — Assessment & Plan Note (Signed)
-  patient discloses that he has had intermittent mood changes since his last hospitalization, I shared that it is difficult to discover that he had a stroke and had to manage this condition along with decreasing his risk but encouraged him that he has done a great job thus far and has come so far since his hospitalization  -reassuringly patient not endorsing current or recent SI over the past few weeks, he denies ever having a plan  -PHQ-9 score of 11 with 1 for question 9 reviewed and discussed. Patient clarifies that he last had these thoughts 3 weeks ago but has never attempted or had a plan. Denies current SI or plan at this time as well.

## 2022-09-16 NOTE — Patient Instructions (Addendum)
It was great seeing you today!  Today we discussed your blood pressure and diabetes, both of which have improved a lot! Your blood pressure looks great! Your A1c is 7.3 which means that your diabetes is now well-controlled. Please continue to take all your medications, including metformin 500 mg twice daily. Please continue to stay active, try to go walking at least 3 times a week. Continue to incorporate plenty of vegetables and lean protein into your diet.    Please follow up at your next scheduled appointment in 3 months, if anything arises between now and then, please don't hesitate to contact our office.   Thank you for allowing [place] to be a part of your medical care!  Thank you, Dr. Larae Grooms  Also a reminder of our clinic's no-show policy. Please make sure to arrive at least 15 minutes prior to your scheduled appointment time. Please try to cancel before 24 hours if you are not able to make it. If you no-show for 2 appointments then you will be receiving a warning letter. If you no-show after 3 visits, then you may be at risk of being dismissed from our clinic. This is to ensure that everyone is able to be seen in a timely manner. Thank you, we appreciate your assistance with this!

## 2022-09-16 NOTE — Progress Notes (Signed)
    SUBJECTIVE:   CHIEF COMPLAINT / HPI:   Hypertension  Patient denies chest pain or dyspnea. Compliant on entresto, says that his blood pressure has significantly improved since starting on this. Denies any BP related concerns.   Type 2 DM Presents for DM follow up. Last A1c 10 .4 about 3 months ago. Compliant on metformin 500 mg bid daily. He has been trying to eat more healthy and has noticed that he has lost weight since his last hospitalization. He has been checking his sugars at home and they usually stay within the low 100s.   OBJECTIVE:   BP 126/79   Pulse 61   Ht 5\' 6"  (1.676 m)   Wt 149 lb 6 oz (67.8 kg)   SpO2 100%   BMI 24.11 kg/m   General: Patient well-appearing, in no acute distress. CV: RRR, no murmurs or gallops auscultated Resp: CTAB, no wheezing, rales or rhonchi noted Ext: no LE edema noted bilaterally, normal microfilament testing and normal foot exam Psych: mood appropriate, denies SI or plan   ASSESSMENT/PLAN:   Hypertension -BP 126/79, at goal -continue entresto and metoprolol -diet and exercise counseling provided  -renal function appropriate with entresto noted from last BMP   Diabetes ([place]) -improved A1c to 7.3, congratulated patient on his hard work -continue metformin 500 mg bid -foot exam up to date -diet and exercise counseling provided -med rec reviewed and updated appropriately   Mood changes -patient discloses that he has had intermittent mood changes since his last hospitalization, I shared that it is difficult to discover that he had a stroke and had to manage this condition along with decreasing his risk but encouraged him that he has done a great job thus far and has come so far since his hospitalization  -reassuringly patient not endorsing current or recent SI over the past few weeks, he denies ever having a plan  -PHQ-9 score of 11 with 1 for question 9 reviewed and discussed. Patient clarifies that he last had these thoughts 3  weeks ago but has never attempted or had a plan. Denies current SI or plan at this time as well.      [name], [place]

## 2022-09-16 NOTE — Assessment & Plan Note (Signed)
-  improved A1c to 7.3, congratulated patient on his hard work -continue metformin 500 mg bid -foot exam up to date -diet and exercise counseling provided -med rec reviewed and updated appropriately

## 2022-10-03 ENCOUNTER — Emergency Department (HOSPITAL_COMMUNITY): Payer: Medicare Other

## 2022-10-03 ENCOUNTER — Encounter (HOSPITAL_COMMUNITY): Payer: Self-pay

## 2022-10-03 ENCOUNTER — Inpatient Hospital Stay (HOSPITAL_COMMUNITY)
Admission: EM | Admit: 2022-10-03 | Discharge: 2022-10-10 | DRG: 378 | Disposition: A | Discharge status: Home / Self Care | Payer: Medicare Other | Attending: Family Medicine | Admitting: Family Medicine

## 2022-10-03 ENCOUNTER — Other Ambulatory Visit: Payer: Self-pay

## 2022-10-03 DIAGNOSIS — E7800 Pure hypercholesterolemia, unspecified: Secondary | ICD-10-CM | POA: Diagnosis present

## 2022-10-03 DIAGNOSIS — D62 Acute posthemorrhagic anemia: Secondary | ICD-10-CM

## 2022-10-03 DIAGNOSIS — K922 Gastrointestinal hemorrhage, unspecified: Secondary | ICD-10-CM

## 2022-10-03 DIAGNOSIS — N280 Ischemia and infarction of kidney: Secondary | ICD-10-CM | POA: Diagnosis present

## 2022-10-03 DIAGNOSIS — Z7982 Long term (current) use of aspirin: Secondary | ICD-10-CM

## 2022-10-03 DIAGNOSIS — I951 Orthostatic hypotension: Secondary | ICD-10-CM | POA: Diagnosis present

## 2022-10-03 DIAGNOSIS — I700 Atherosclerosis of aorta: Secondary | ICD-10-CM | POA: Diagnosis present

## 2022-10-03 DIAGNOSIS — R799 Abnormal finding of blood chemistry, unspecified: Secondary | ICD-10-CM

## 2022-10-03 DIAGNOSIS — R195 Other fecal abnormalities: Secondary | ICD-10-CM

## 2022-10-03 DIAGNOSIS — Z955 Presence of coronary angioplasty implant and graft: Secondary | ICD-10-CM

## 2022-10-03 DIAGNOSIS — Z1152 Encounter for screening for COVID-19: Secondary | ICD-10-CM

## 2022-10-03 DIAGNOSIS — Z8041 Family history of malignant neoplasm of ovary: Secondary | ICD-10-CM

## 2022-10-03 DIAGNOSIS — R432 Parageusia: Secondary | ICD-10-CM | POA: Diagnosis present

## 2022-10-03 DIAGNOSIS — F339 Major depressive disorder, recurrent, unspecified: Secondary | ICD-10-CM | POA: Diagnosis present

## 2022-10-03 DIAGNOSIS — I5020 Unspecified systolic (congestive) heart failure: Secondary | ICD-10-CM

## 2022-10-03 DIAGNOSIS — I7090 Unspecified atherosclerosis: Secondary | ICD-10-CM | POA: Insufficient documentation

## 2022-10-03 DIAGNOSIS — N179 Acute kidney failure, unspecified: Secondary | ICD-10-CM | POA: Diagnosis not present

## 2022-10-03 DIAGNOSIS — Z79899 Other long term (current) drug therapy: Secondary | ICD-10-CM

## 2022-10-03 DIAGNOSIS — Z7902 Long term (current) use of antithrombotics/antiplatelets: Secondary | ICD-10-CM

## 2022-10-03 DIAGNOSIS — R4586 Emotional lability: Secondary | ICD-10-CM | POA: Diagnosis present

## 2022-10-03 DIAGNOSIS — K250 Acute gastric ulcer with hemorrhage: Secondary | ICD-10-CM | POA: Diagnosis not present

## 2022-10-03 DIAGNOSIS — Z87442 Personal history of urinary calculi: Secondary | ICD-10-CM

## 2022-10-03 DIAGNOSIS — Z7984 Long term (current) use of oral hypoglycemic drugs: Secondary | ICD-10-CM

## 2022-10-03 DIAGNOSIS — R1310 Dysphagia, unspecified: Secondary | ICD-10-CM | POA: Diagnosis present

## 2022-10-03 DIAGNOSIS — I5022 Chronic systolic (congestive) heart failure: Secondary | ICD-10-CM | POA: Diagnosis present

## 2022-10-03 DIAGNOSIS — K648 Other hemorrhoids: Secondary | ICD-10-CM | POA: Diagnosis present

## 2022-10-03 DIAGNOSIS — I8290 Acute embolism and thrombosis of unspecified vein: Secondary | ICD-10-CM

## 2022-10-03 DIAGNOSIS — Z7901 Long term (current) use of anticoagulants: Secondary | ICD-10-CM

## 2022-10-03 DIAGNOSIS — E119 Type 2 diabetes mellitus without complications: Secondary | ICD-10-CM

## 2022-10-03 DIAGNOSIS — D649 Anemia, unspecified: Secondary | ICD-10-CM | POA: Diagnosis present

## 2022-10-03 DIAGNOSIS — R441 Visual hallucinations: Secondary | ICD-10-CM | POA: Diagnosis not present

## 2022-10-03 DIAGNOSIS — I2510 Atherosclerotic heart disease of native coronary artery without angina pectoris: Secondary | ICD-10-CM | POA: Diagnosis present

## 2022-10-03 DIAGNOSIS — K55069 Acute infarction of intestine, part and extent unspecified: Secondary | ICD-10-CM | POA: Diagnosis present

## 2022-10-03 DIAGNOSIS — Z800 Family history of malignant neoplasm of digestive organs: Secondary | ICD-10-CM

## 2022-10-03 DIAGNOSIS — R42 Dizziness and giddiness: Principal | ICD-10-CM

## 2022-10-03 DIAGNOSIS — K219 Gastro-esophageal reflux disease without esophagitis: Secondary | ICD-10-CM | POA: Diagnosis present

## 2022-10-03 DIAGNOSIS — I252 Old myocardial infarction: Secondary | ICD-10-CM

## 2022-10-03 DIAGNOSIS — B9681 Helicobacter pylori [H. pylori] as the cause of diseases classified elsewhere: Secondary | ICD-10-CM | POA: Diagnosis present

## 2022-10-03 DIAGNOSIS — K31819 Angiodysplasia of stomach and duodenum without bleeding: Secondary | ICD-10-CM

## 2022-10-03 DIAGNOSIS — I69398 Other sequelae of cerebral infarction: Secondary | ICD-10-CM

## 2022-10-03 DIAGNOSIS — I110 Hypertensive heart disease with heart failure: Secondary | ICD-10-CM | POA: Diagnosis present

## 2022-10-03 DIAGNOSIS — R531 Weakness: Secondary | ICD-10-CM

## 2022-10-03 LAB — CBC
HCT: 34 % — ABNORMAL LOW (ref 39.0–52.0)
Hemoglobin: 11.5 g/dL — ABNORMAL LOW (ref 13.0–17.0)
MCH: 30.7 pg (ref 26.0–34.0)
MCHC: 33.8 g/dL (ref 30.0–36.0)
MCV: 90.7 fL (ref 80.0–100.0)
Platelets: 291 10*3/uL (ref 150–400)
RBC: 3.75 MIL/uL — ABNORMAL LOW (ref 4.22–5.81)
RDW: 14.7 % (ref 11.5–15.5)
WBC: 8.8 10*3/uL (ref 4.0–10.5)
nRBC: 0 % (ref 0.0–0.2)

## 2022-10-03 LAB — URINALYSIS, ROUTINE W REFLEX MICROSCOPIC
Bilirubin Urine: NEGATIVE
Glucose, UA: NEGATIVE mg/dL
Hgb urine dipstick: NEGATIVE
Ketones, ur: 15 mg/dL — AB
Nitrite: NEGATIVE
Protein, ur: NEGATIVE mg/dL
Specific Gravity, Urine: 1.025 (ref 1.005–1.030)
pH: 5.5 (ref 5.0–8.0)

## 2022-10-03 LAB — RESP PANEL BY RT-PCR (RSV, FLU A&B, COVID)  RVPGX2
Influenza A by PCR: NEGATIVE
Influenza B by PCR: NEGATIVE
Resp Syncytial Virus by PCR: NEGATIVE
SARS Coronavirus 2 by RT PCR: NEGATIVE

## 2022-10-03 LAB — BASIC METABOLIC PANEL
Anion gap: 11 (ref 5–15)
BUN: 45 mg/dL — ABNORMAL HIGH (ref 8–23)
CO2: 25 mmol/L (ref 22–32)
Calcium: 8.9 mg/dL (ref 8.9–10.3)
Chloride: 103 mmol/L (ref 98–111)
Creatinine, Ser: 1.23 mg/dL (ref 0.61–1.24)
GFR, Estimated: >60 mL/min (ref >60)
Glucose, Bld: 188 mg/dL — ABNORMAL HIGH (ref 70–99)
Potassium: 4.2 mmol/L (ref 3.5–5.1)
Sodium: 139 mmol/L (ref 135–145)

## 2022-10-03 LAB — URINALYSIS, MICROSCOPIC (REFLEX): Bacteria, UA: NONE SEEN

## 2022-10-03 LAB — CBG MONITORING, ED: Glucose-Capillary: 146 mg/dL — ABNORMAL HIGH (ref 70–99)

## 2022-10-03 MED ORDER — SODIUM CHLORIDE 0.9 % IV BOLUS
500.0000 mL | Freq: Once | INTRAVENOUS | Status: AC
  Administered 2022-10-03: 500 mL via INTRAVENOUS

## 2022-10-03 NOTE — ED Triage Notes (Signed)
Pt c/o chills, dizziness, weakness, nausea started this morning upon waking. Pt denies vomiting and diarrhea.

## 2022-10-03 NOTE — ED Provider Triage Note (Signed)
Emergency Medicine Provider Triage Evaluation Note  Mike [name] , a 67 y.o. male  was evaluated in triage.  Pt complains of dizziness and weakness.  Patient reports that he woke up today feeling some increased weakness.  He reports that he fell like he may pass out.  Feels that day-to-day tasks are significantly more difficult now and he is experiencing some increased shortness of breath.  Patient had prior history of heart failure and also had a stroke diagnosed in November 2023.  Review of Systems  Positive: As above Negative: As above  Physical Exam  BP (!) 144/95   Pulse (!) 107   Temp 98.3 F (36.8 C) (Oral)   Resp 16   Ht '5\' 6"'$  (1.676 m)   Wt 67.8 kg   SpO2 100%   BMI 24.13 kg/m  Gen:   Awake, no distress   Resp:  Normal effort  MSK:   Moves extremities without difficulty  Other:   Medical Decision Making  Medically screening exam initiated at 1:02 PM.  Appropriate orders placed.  Ashlan Tara was informed that the remainder of the evaluation will be completed by another provider, this initial triage assessment does not replace that evaluation, and the importance of remaining in the ED until their evaluation is complete.     Luvenia Heller, PA-C 10/03/22 1303

## 2022-10-04 ENCOUNTER — Observation Stay (HOSPITAL_COMMUNITY): Payer: Medicare Other

## 2022-10-04 ENCOUNTER — Ambulatory Visit: Payer: Medicare HMO | Admitting: Speech Pathology

## 2022-10-04 ENCOUNTER — Other Ambulatory Visit: Payer: Self-pay | Admitting: Nurse Practitioner

## 2022-10-04 DIAGNOSIS — Z7982 Long term (current) use of aspirin: Secondary | ICD-10-CM | POA: Diagnosis not present

## 2022-10-04 DIAGNOSIS — R441 Visual hallucinations: Secondary | ICD-10-CM | POA: Diagnosis not present

## 2022-10-04 DIAGNOSIS — D649 Anemia, unspecified: Secondary | ICD-10-CM

## 2022-10-04 DIAGNOSIS — I5022 Chronic systolic (congestive) heart failure: Secondary | ICD-10-CM | POA: Diagnosis present

## 2022-10-04 DIAGNOSIS — E7800 Pure hypercholesterolemia, unspecified: Secondary | ICD-10-CM | POA: Diagnosis present

## 2022-10-04 DIAGNOSIS — K254 Chronic or unspecified gastric ulcer with hemorrhage: Secondary | ICD-10-CM | POA: Diagnosis not present

## 2022-10-04 DIAGNOSIS — K31819 Angiodysplasia of stomach and duodenum without bleeding: Secondary | ICD-10-CM | POA: Diagnosis not present

## 2022-10-04 DIAGNOSIS — I951 Orthostatic hypotension: Secondary | ICD-10-CM | POA: Diagnosis present

## 2022-10-04 DIAGNOSIS — I700 Atherosclerosis of aorta: Secondary | ICD-10-CM | POA: Diagnosis present

## 2022-10-04 DIAGNOSIS — I252 Old myocardial infarction: Secondary | ICD-10-CM | POA: Diagnosis not present

## 2022-10-04 DIAGNOSIS — R432 Parageusia: Secondary | ICD-10-CM | POA: Diagnosis present

## 2022-10-04 DIAGNOSIS — Z1152 Encounter for screening for COVID-19: Secondary | ICD-10-CM | POA: Diagnosis not present

## 2022-10-04 DIAGNOSIS — I10 Essential (primary) hypertension: Secondary | ICD-10-CM | POA: Diagnosis not present

## 2022-10-04 DIAGNOSIS — I2510 Atherosclerotic heart disease of native coronary artery without angina pectoris: Secondary | ICD-10-CM | POA: Diagnosis not present

## 2022-10-04 DIAGNOSIS — D500 Iron deficiency anemia secondary to blood loss (chronic): Secondary | ICD-10-CM | POA: Diagnosis not present

## 2022-10-04 DIAGNOSIS — K55069 Acute infarction of intestine, part and extent unspecified: Secondary | ICD-10-CM | POA: Diagnosis present

## 2022-10-04 DIAGNOSIS — I5020 Unspecified systolic (congestive) heart failure: Secondary | ICD-10-CM

## 2022-10-04 DIAGNOSIS — D62 Acute posthemorrhagic anemia: Secondary | ICD-10-CM | POA: Diagnosis present

## 2022-10-04 DIAGNOSIS — I69398 Other sequelae of cerebral infarction: Secondary | ICD-10-CM | POA: Diagnosis not present

## 2022-10-04 DIAGNOSIS — R531 Weakness: Secondary | ICD-10-CM

## 2022-10-04 DIAGNOSIS — K648 Other hemorrhoids: Secondary | ICD-10-CM | POA: Diagnosis not present

## 2022-10-04 DIAGNOSIS — N179 Acute kidney failure, unspecified: Secondary | ICD-10-CM | POA: Diagnosis not present

## 2022-10-04 DIAGNOSIS — K219 Gastro-esophageal reflux disease without esophagitis: Secondary | ICD-10-CM | POA: Diagnosis present

## 2022-10-04 DIAGNOSIS — R55 Syncope and collapse: Secondary | ICD-10-CM | POA: Diagnosis not present

## 2022-10-04 DIAGNOSIS — Z7902 Long term (current) use of antithrombotics/antiplatelets: Secondary | ICD-10-CM | POA: Diagnosis not present

## 2022-10-04 DIAGNOSIS — E119 Type 2 diabetes mellitus without complications: Secondary | ICD-10-CM | POA: Diagnosis present

## 2022-10-04 DIAGNOSIS — K922 Gastrointestinal hemorrhage, unspecified: Secondary | ICD-10-CM | POA: Diagnosis not present

## 2022-10-04 DIAGNOSIS — K250 Acute gastric ulcer with hemorrhage: Secondary | ICD-10-CM | POA: Diagnosis present

## 2022-10-04 DIAGNOSIS — R42 Dizziness and giddiness: Secondary | ICD-10-CM | POA: Diagnosis not present

## 2022-10-04 DIAGNOSIS — I110 Hypertensive heart disease with heart failure: Secondary | ICD-10-CM | POA: Diagnosis not present

## 2022-10-04 DIAGNOSIS — I7090 Unspecified atherosclerosis: Secondary | ICD-10-CM | POA: Insufficient documentation

## 2022-10-04 DIAGNOSIS — I8290 Acute embolism and thrombosis of unspecified vein: Secondary | ICD-10-CM

## 2022-10-04 DIAGNOSIS — R1310 Dysphagia, unspecified: Secondary | ICD-10-CM | POA: Diagnosis present

## 2022-10-04 DIAGNOSIS — I509 Heart failure, unspecified: Secondary | ICD-10-CM | POA: Diagnosis not present

## 2022-10-04 DIAGNOSIS — N280 Ischemia and infarction of kidney: Secondary | ICD-10-CM | POA: Diagnosis present

## 2022-10-04 DIAGNOSIS — B9681 Helicobacter pylori [H. pylori] as the cause of diseases classified elsewhere: Secondary | ICD-10-CM | POA: Diagnosis not present

## 2022-10-04 DIAGNOSIS — R195 Other fecal abnormalities: Secondary | ICD-10-CM | POA: Diagnosis not present

## 2022-10-04 DIAGNOSIS — F339 Major depressive disorder, recurrent, unspecified: Secondary | ICD-10-CM | POA: Diagnosis present

## 2022-10-04 LAB — HEMOGLOBIN AND HEMATOCRIT, BLOOD
HCT: 26.4 % — ABNORMAL LOW (ref 39.0–52.0)
HCT: 22.1 % — ABNORMAL LOW (ref 39.0–52.0)
HCT: 25.3 % — ABNORMAL LOW (ref 39.0–52.0)
HCT: 24.4 % — ABNORMAL LOW (ref 39.0–52.0)
Hemoglobin: 8.7 g/dL — ABNORMAL LOW (ref 13.0–17.0)
Hemoglobin: 7.6 g/dL — ABNORMAL LOW (ref 13.0–17.0)
Hemoglobin: 8.7 g/dL — ABNORMAL LOW (ref 13.0–17.0)
Hemoglobin: 8.7 g/dL — ABNORMAL LOW (ref 13.0–17.0)

## 2022-10-04 LAB — ECHOCARDIOGRAM COMPLETE
AR max vel: 2.71 cm2
AV Area VTI: 2.83 cm2
AV Area mean vel: 2.74 cm2
AV Mean grad: 3.7 mmHg
AV Peak grad: 6.3 mmHg
Ao pk vel: 1.25 m/s
Area-P 1/2: 3.77 cm2
Height: 66 in
S' Lateral: 3.7 cm
Weight: 2391.55 oz

## 2022-10-04 LAB — CBC
HCT: 25.2 % — ABNORMAL LOW (ref 39.0–52.0)
Hemoglobin: 8.4 g/dL — ABNORMAL LOW (ref 13.0–17.0)
MCH: 30.7 pg (ref 26.0–34.0)
MCHC: 33.3 g/dL (ref 30.0–36.0)
MCV: 92 fL (ref 80.0–100.0)
Platelets: 217 10*3/uL (ref 150–400)
RBC: 2.74 MIL/uL — ABNORMAL LOW (ref 4.22–5.81)
RDW: 15.1 % (ref 11.5–15.5)
WBC: 6.4 10*3/uL (ref 4.0–10.5)
nRBC: 0 % (ref 0.0–0.2)

## 2022-10-04 LAB — HEPATIC FUNCTION PANEL
ALT: 22 U/L (ref 0–44)
AST: 27 U/L (ref 15–41)
Albumin: 2.9 g/dL — ABNORMAL LOW (ref 3.5–5.0)
Alkaline Phosphatase: 63 U/L (ref 38–126)
Bilirubin, Direct: 0.2 mg/dL (ref 0.0–0.2)
Indirect Bilirubin: 0.7 mg/dL (ref 0.3–0.9)
Total Bilirubin: 0.9 mg/dL (ref 0.3–1.2)
Total Protein: 5.2 g/dL — ABNORMAL LOW (ref 6.5–8.1)

## 2022-10-04 LAB — DIFFERENTIAL
Abs Immature Granulocytes: 0.02 10*3/uL (ref 0.00–0.07)
Basophils Absolute: 0 10*3/uL (ref 0.0–0.1)
Basophils Relative: 0 %
Eosinophils Absolute: 0 10*3/uL (ref 0.0–0.5)
Eosinophils Relative: 1 %
Immature Granulocytes: 0 %
Lymphocytes Relative: 24 %
Lymphs Abs: 1.5 10*3/uL (ref 0.7–4.0)
Monocytes Absolute: 0.5 10*3/uL (ref 0.1–1.0)
Monocytes Relative: 8 %
Neutro Abs: 4.3 10*3/uL (ref 1.7–7.7)
Neutrophils Relative %: 67 %

## 2022-10-04 LAB — PREPARE RBC (CROSSMATCH)

## 2022-10-04 LAB — IRON AND TIBC
Iron: 157 ug/dL (ref 45–182)
Saturation Ratios: 71 % — ABNORMAL HIGH (ref 17.9–39.5)
TIBC: 220 ug/dL — ABNORMAL LOW (ref 250–450)
UIBC: 63 ug/dL

## 2022-10-04 LAB — BASIC METABOLIC PANEL
Anion gap: 8 (ref 5–15)
BUN: 57 mg/dL — ABNORMAL HIGH (ref 8–23)
CO2: 23 mmol/L (ref 22–32)
Calcium: 8.1 mg/dL — ABNORMAL LOW (ref 8.9–10.3)
Chloride: 107 mmol/L (ref 98–111)
Creatinine, Ser: 1.15 mg/dL (ref 0.61–1.24)
GFR, Estimated: >60 mL/min (ref >60)
Glucose, Bld: 122 mg/dL — ABNORMAL HIGH (ref 70–99)
Potassium: 4.1 mmol/L (ref 3.5–5.1)
Sodium: 138 mmol/L (ref 135–145)

## 2022-10-04 LAB — POC OCCULT BLOOD, ED: Fecal Occult Bld: POSITIVE — AB

## 2022-10-04 LAB — TECHNOLOGIST SMEAR REVIEW

## 2022-10-04 LAB — ABO/RH: ABO/RH(D): O POS

## 2022-10-04 LAB — GLUCOSE, CAPILLARY
Glucose-Capillary: 121 mg/dL — ABNORMAL HIGH (ref 70–99)
Glucose-Capillary: 218 mg/dL — ABNORMAL HIGH (ref 70–99)
Glucose-Capillary: 97 mg/dL (ref 70–99)

## 2022-10-04 LAB — MAGNESIUM: Magnesium: 2.1 mg/dL (ref 1.7–2.4)

## 2022-10-04 LAB — CBG MONITORING, ED: Glucose-Capillary: 113 mg/dL — ABNORMAL HIGH (ref 70–99)

## 2022-10-04 LAB — TSH: TSH: 0.707 u[IU]/mL (ref 0.350–4.500)

## 2022-10-04 LAB — D-DIMER, QUANTITATIVE: D-Dimer, Quant: <0.27 ug/mL-FEU (ref 0.00–0.50)

## 2022-10-04 LAB — FERRITIN: Ferritin: 167 ng/mL (ref 24–336)

## 2022-10-04 MED ORDER — IOHEXOL 350 MG/ML SOLN
75.0000 mL | Freq: Once | INTRAVENOUS | Status: AC | PRN
  Administered 2022-10-04: 75 mL via INTRAVENOUS

## 2022-10-04 MED ORDER — ATORVASTATIN CALCIUM 80 MG PO TABS
80.0000 mg | ORAL_TABLET | Freq: Every day | ORAL | Status: DC
  Administered 2022-10-04 – 2022-10-10 (×6): 80 mg via ORAL
  Filled 2022-10-04 (×8): qty 1

## 2022-10-04 MED ORDER — SODIUM CHLORIDE 0.9 % IV SOLN: INTRAVENOUS | Status: DC

## 2022-10-04 MED ORDER — PEG-KCL-NACL-NASULF-NA ASC-C 100 G PO SOLR
0.5000 | Freq: Once | ORAL | Status: AC
  Administered 2022-10-07: 100 g via ORAL

## 2022-10-04 MED ORDER — ENSURE ENLIVE PO LIQD
237.0000 mL | Freq: Two times a day (BID) | ORAL | Status: DC
  Administered 2022-10-06 – 2022-10-10 (×7): 237 mL via ORAL

## 2022-10-04 MED ORDER — PEG-KCL-NACL-NASULF-NA ASC-C 100 G PO SOLR
0.5000 | Freq: Once | ORAL | Status: AC
  Administered 2022-10-06: 100 g via ORAL
  Filled 2022-10-04 (×2): qty 1

## 2022-10-04 MED ORDER — SODIUM CHLORIDE 0.9% IV SOLUTION: Freq: Once | INTRAVENOUS | Status: AC

## 2022-10-04 MED ORDER — PANTOPRAZOLE SODIUM 40 MG IV SOLR
40.0000 mg | Freq: Every day | INTRAVENOUS | Status: DC
  Administered 2022-10-04 – 2022-10-07 (×4): 40 mg via INTRAVENOUS
  Filled 2022-10-04 (×4): qty 10

## 2022-10-04 MED ORDER — HEPARIN (PORCINE) 25000 UT/250ML-% IV SOLN
1100.0000 [IU]/h | INTRAVENOUS | Status: DC
  Administered 2022-10-04: 1100 [IU]/h via INTRAVENOUS
  Filled 2022-10-04: qty 250

## 2022-10-04 MED ORDER — PEG-KCL-NACL-NASULF-NA ASC-C 100 G PO SOLR: 1.0000 | Freq: Once | ORAL | Status: DC

## 2022-10-04 MED ORDER — ENOXAPARIN SODIUM 40 MG/0.4ML IJ SOSY: 40.0000 mg | PREFILLED_SYRINGE | INTRAMUSCULAR | Status: DC

## 2022-10-04 MED ORDER — INSULIN ASPART 100 UNIT/ML IJ SOLN
0.0000 [IU] | Freq: Three times a day (TID) | INTRAMUSCULAR | Status: DC
  Administered 2022-10-04 – 2022-10-05 (×2): 3 [IU] via SUBCUTANEOUS
  Administered 2022-10-06: 1 [IU] via SUBCUTANEOUS
  Administered 2022-10-06: 2 [IU] via SUBCUTANEOUS
  Administered 2022-10-07 (×2): 1 [IU] via SUBCUTANEOUS
  Administered 2022-10-08 – 2022-10-09 (×2): 2 [IU] via SUBCUTANEOUS
  Administered 2022-10-10: 5 [IU] via SUBCUTANEOUS

## 2022-10-04 MED ORDER — ADULT MULTIVITAMIN W/MINERALS CH
1.0000 | ORAL_TABLET | Freq: Every day | ORAL | Status: DC
  Administered 2022-10-04 – 2022-10-10 (×6): 1 via ORAL
  Filled 2022-10-04 (×7): qty 1

## 2022-10-04 MED ORDER — MELATONIN 3 MG PO TABS
3.0000 mg | ORAL_TABLET | Freq: Every day | ORAL | Status: DC
  Administered 2022-10-04 – 2022-10-09 (×6): 3 mg via ORAL
  Filled 2022-10-04 (×6): qty 1

## 2022-10-04 NOTE — ED Notes (Signed)
ED TO INPATIENT HANDOFF REPORT  ED Nurse Name and Phone #: [phone]  S Name/Age/Gender Mike [name] 67 y.o. male Room/Bed: 036C/036C  Code Status   Code Status: Full Code  Home/SNF/Other Home Patient oriented to: self, place, time, and situation Is this baseline? Yes   Triage Complete: Triage complete  Chief Complaint Orthostatic hypotension [I95.1]  Triage Note Pt c/o chills, dizziness, weakness, nausea started this morning upon waking. Pt denies vomiting and diarrhea.   Allergies No Known Allergies  Level of Care/Admitting Diagnosis ED Disposition     ED Disposition  Admit   Condition  --   Comment  Hospital Area: [facility] [[id number]]  Level of Care: Telemetry Medical [104]  May place patient in observation at [facility] or [facility] if equivalent level of care is available:: No  Covid Evaluation: Asymptomatic - no recent exposure (last 10 days) testing not required  Diagnosis: Orthostatic hypotension [458.0.ICD-9-CM]  Admitting Physician: Lind Covert [[id number]]  Attending Physician: Erin Hearing, MARSHALL L [[id number]]          B Medical/Surgery History Past Medical History:  Diagnosis Date   Blood disorder    Diabetes ([place])    High blood pressure    High cholesterol    Kidney stones    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    Past Surgical History:  Procedure Laterality Date   CORONARY ANGIOPLASTY WITH STENT PLACEMENT     VENA CAVA FILTER PLACEMENT       A IV Location/Drains/Wounds Patient Lines/Drains/Airways Status     Active Line/Drains/Airways     Name Placement date Placement time Site Days   Peripheral IV 10/04/22 20 G Right Antecubital 10/04/22  0049  Antecubital  less than 1            Intake/Output Last 24 hours  Intake/Output Summary (Last 24 hours) at 10/04/2022 [id number] Last data filed at 10/04/2022 0152 Gross per 24 hour  Intake 500 ml  Output --  Net 500 ml    Labs/Imaging Results for orders placed or  performed during the hospital encounter of 10/03/22 (from the past 48 hour(s))  Resp panel by RT-PCR (RSV, Flu A&B, Covid) Anterior Nasal Swab     Status: None   Collection Time: 10/03/22 12:48 PM   Specimen: Anterior Nasal Swab  Result Value Ref Range   SARS Coronavirus 2 by RT PCR NEGATIVE NEGATIVE   Influenza A by PCR NEGATIVE NEGATIVE   Influenza B by PCR NEGATIVE NEGATIVE    Comment: (NOTE) The Xpert Xpress SARS-CoV-2/FLU/RSV plus assay is intended as an aid in the diagnosis of influenza from Nasopharyngeal swab specimens and should not be used as a sole basis for treatment. Nasal washings and aspirates are unacceptable for Xpert Xpress SARS-CoV-2/FLU/RSV testing.  Fact Sheet for Patients: EntrepreneurPulse.com.au  Fact Sheet for Healthcare Providers: IncredibleEmployment.be  This test is not yet approved or cleared by the [place] FDA and has been authorized for detection and/or diagnosis of SARS-CoV-2 by FDA under an Emergency Use Authorization (EUA). This EUA will remain in effect (meaning this test can be used) for the duration of the COVID-19 declaration under Section 564(b)(1) of the Act, 21 U.S.C. section 360bbb-3(b)(1), unless the authorization is terminated or revoked.     Resp Syncytial Virus by PCR NEGATIVE NEGATIVE    Comment: (NOTE) Fact Sheet for Patients: EntrepreneurPulse.com.au  Fact Sheet for Healthcare Providers: IncredibleEmployment.be  This test is not yet approved or cleared by the [place] and has been authorized  for detection and/or diagnosis of SARS-CoV-2 by FDA under an Emergency Use Authorization (EUA). This EUA will remain in effect (meaning this test can be used) for the duration of the COVID-19 declaration under Section 564(b)(1) of the Act, 21 U.S.C. section 360bbb-3(b)(1), unless the authorization is terminated or revoked.  Performed at [facility], [place] [address] [place], [place] [id number]   Basic metabolic panel     Status: Abnormal   Collection Time: 10/03/22 12:53 PM  Result Value Ref Range   Sodium 139 135 - 145 mmol/L   Potassium 4.2 3.5 - 5.1 mmol/L   Chloride 103 98 - 111 mmol/L   CO2 25 22 - 32 mmol/L   Glucose, Bld 188 (H) 70 - 99 mg/dL    Comment: Glucose reference range applies only to samples taken after fasting for at least 8 hours.   BUN 45 (H) 8 - 23 mg/dL   Creatinine, Ser 1.23 0.61 - 1.24 mg/dL   Calcium 8.9 8.9 - 10.3 mg/dL   GFR, Estimated >60 >60 mL/min    Comment: (NOTE) Calculated using the CKD-EPI Creatinine Equation (2021)    Anion gap 11 5 - 15    Comment: Performed at [facility] [address], [place], [place] [address]  CBC     Status: Abnormal   Collection Time: 10/03/22 12:53 PM  Result Value Ref Range   WBC 8.8 4.0 - 10.5 K/uL   RBC 3.75 (L) 4.22 - 5.81 MIL/uL   Hemoglobin 11.5 (L) 13.0 - 17.0 g/dL   HCT 34.0 (L) 39.0 - 52.0 %   MCV 90.7 80.0 - 100.0 fL   MCH 30.7 26.0 - 34.0 pg   MCHC 33.8 30.0 - 36.0 g/dL   RDW 14.7 11.5 - 15.5 %   Platelets 291 150 - 400 K/uL   nRBC 0.0 0.0 - 0.2 %    Comment: Performed at [facility], [place] [address] [place], [place] [address]  ABO/Rh     Status: None   Collection Time: 10/03/22 12:53 PM  Result Value Ref Range   ABO/RH(D)      O POS Performed at [facility] [address], [place], [place] [address]   CBG monitoring, ED     Status: Abnormal   Collection Time: 10/03/22  9:27 PM  Result Value Ref Range   Glucose-Capillary 146 (H) 70 - 99 mg/dL    Comment: Glucose reference range applies only to samples taken after fasting for at least 8 hours.   Comment 1 Notify RN    Comment 2 Document in Chart   Urinalysis, Routine w reflex microscopic -Urine, Clean Catch     Status: Abnormal   Collection Time: 10/03/22  9:31 PM  Result Value Ref Range   Color, Urine YELLOW YELLOW   APPearance CLEAR CLEAR   Specific  Gravity, Urine 1.025 1.005 - 1.030   pH 5.5 5.0 - 8.0   Glucose, UA NEGATIVE NEGATIVE mg/dL   Hgb urine dipstick NEGATIVE NEGATIVE   Bilirubin Urine NEGATIVE NEGATIVE   Ketones, ur 15 (A) NEGATIVE mg/dL   Protein, ur NEGATIVE NEGATIVE mg/dL   Nitrite NEGATIVE NEGATIVE   Leukocytes,Ua TRACE (A) NEGATIVE    Comment: Performed at [facility] [address], [place], [place] [address]  Urinalysis, Microscopic (reflex)     Status: None   Collection Time: 10/03/22  9:31 PM  Result Value Ref Range   RBC / HPF 0-5 0 -  5 RBC/hpf   WBC, UA 0-5 0 - 5 WBC/hpf   Bacteria, UA NONE SEEN NONE SEEN   Squamous Epithelial / HPF 0-5 0 - 5 /HPF   Mucus PRESENT    Hyaline Casts, UA PRESENT     Comment: Performed at [facility], [place] [address] [place], [place] [address]  Magnesium     Status: None   Collection Time: 10/03/22 11:40 PM  Result Value Ref Range   Magnesium 2.1 1.7 - 2.4 mg/dL    Comment: Performed at [facility], [place] [address], [place], [place] [address]  TSH     Status: None   Collection Time: 10/03/22 11:40 PM  Result Value Ref Range   TSH 0.707 0.350 - 4.500 uIU/mL    Comment: Performed by a 3rd Generation assay with a functional sensitivity of <=0.01 uIU/mL. Performed at [facility], [place] [address], [place], [place] [address]   POC occult blood, ED     Status: Abnormal   Collection Time: 10/04/22 12:44 AM  Result Value Ref Range   Fecal Occult Bld POSITIVE (A) NEGATIVE  D-dimer, quantitative     Status: None   Collection Time: 10/04/22 12:47 AM  Result Value Ref Range   D-Dimer, Quant <0.27 0.00 - 0.50 ug/mL-FEU    Comment: (NOTE) At the manufacturer cut-off value of 0.5 g/mL FEU, this assay has a negative predictive value of 95-100%.This assay is intended for use in conjunction with a clinical pretest probability (PTP) assessment model to exclude pulmonary embolism (PE) and deep venous thrombosis (DVT) in outpatients suspected of PE or  DVT. Results should be correlated with clinical presentation. Performed at [facility], [place] [address] [place], [place] [address]   Hemoglobin and hematocrit, blood     Status: Abnormal   Collection Time: 10/04/22  1:56 AM  Result Value Ref Range   Hemoglobin 8.7 (L) 13.0 - 17.0 g/dL   HCT 26.4 (L) 39.0 - 52.0 %    Comment: Performed at [facility], [place] [address] [place], [place] [id number]  Basic metabolic panel     Status: Abnormal   Collection Time: 10/04/22  3:36 AM  Result Value Ref Range   Sodium 138 135 - 145 mmol/L   Potassium 4.1 3.5 - 5.1 mmol/L   Chloride 107 98 - 111 mmol/L   CO2 23 22 - 32 mmol/L   Glucose, Bld 122 (H) 70 - 99 mg/dL    Comment: Glucose reference range applies only to samples taken after fasting for at least 8 hours.   BUN 57 (H) 8 - 23 mg/dL   Creatinine, Ser 1.15 0.61 - 1.24 mg/dL   Calcium 8.1 (L) 8.9 - 10.3 mg/dL   GFR, Estimated >60 >60 mL/min    Comment: (NOTE) Calculated using the CKD-EPI Creatinine Equation (2021)    Anion gap 8 5 - 15    Comment: Performed at [facility] [address], [place], [place] [address]  CBG monitoring, ED     Status: Abnormal   Collection Time: 10/04/22  7:56 AM  Result Value Ref Range   Glucose-Capillary 113 (H) 70 - 99 mg/dL    Comment: Glucose reference range applies only to samples taken after fasting for at least 8 hours.   CT ABDOMEN PELVIS W WO CONTRAST  Result Date: 10/04/2022 CLINICAL DATA:  Dizziness and weakness. EXAM: CT ABDOMEN AND PELVIS WITHOUT AND WITH CONTRAST TECHNIQUE: Multidetector CT imaging of the  abdomen and pelvis was performed following the standard protocol before and following the bolus administration of intravenous contrast. RADIATION DOSE REDUCTION: This exam was performed according to the departmental dose-optimization program which includes automated exposure control, adjustment of the mA and/or kV according to patient size and/or use of iterative reconstruction  technique. CONTRAST:  10m OMNIPAQUE IOHEXOL 350 MG/ML SOLN COMPARISON:  November 13, 2014 FINDINGS: Lower chest: No acute abnormality. Hepatobiliary: There is diffuse fatty infiltration of the liver parenchyma. No focal liver abnormality is seen. No gallstones, gallbladder wall thickening, or biliary dilatation. Pancreas: Unremarkable. No pancreatic ductal dilatation or surrounding inflammatory changes. Spleen: Normal in size without focal abnormality. Adrenals/Urinary Tract: Adrenal glands are unremarkable. Kidneys are normal in size, without renal calculi or hydronephrosis. A 13 mm diameter simple cyst is seen within the lower pole of the right kidney. Mild diffuse heterogeneous hypoattenuation of the right kidney is seen. Mild right-sided perinephric inflammatory fat stranding is also noted. Bladder is unremarkable. Stomach/Bowel: Stomach is within normal limits. Appendix appears normal. A large amount of stool is seen throughout the colon. No evidence of bowel wall thickening, distention, or inflammatory changes. Vascular/Lymphatic: Aortic atherosclerosis. An area of intraluminal low attenuation is seen within the origin of a diminutive right renal artery (axial CT image 31, CT series 3/coronal reformatted images 65 and 66, CT series 11). A small amount of thrombus is also seen within the mid portion of the superior mesenteric artery (axial CT images 29 through 31, CT series 3/coronal reformatted image 55, CT series 11). An inferior vena cava filter is seen. No enlarged abdominal or pelvic lymph nodes. Reproductive: There is mild to moderate severity prostate gland enlargement. Other: No abdominal wall hernia or abnormality. No abdominopelvic ascites. Musculoskeletal: No acute or significant osseous findings. IMPRESSION: 1. 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 Nephrology consult and further evaluation with conventional angiography is recommended. 2. 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 3. Hepatic steatosis. 4. Enlarged prostate gland. 5. Aortic atherosclerosis. Aortic Atherosclerosis (ICD10-I70.0). Electronically Signed   By: TVirgina NorfolkM.D.   On: 10/04/2022 03:39   DG Chest 1 View  Result Date: 10/03/2022 CLINICAL DATA:  Weakness EXAM: CHEST  1 VIEW COMPARISON:  CXR 06/15/22 FINDINGS: No pleural effusion. No pneumothorax. Normal cardiac and mediastinal contours. No focal airspace opacity. No radiographically apparent displaced rib fractures. Visualized upper abdomen is unremarkable. IMPRESSION: No active disease. Electronically Signed   By: HMarin RobertsM.D.   On: 10/03/2022 13:40    Pending Labs Unresulted Labs (From admission, onward)     Start     Ordered   10/05/22 0500  Heparin level (unfractionated)  Daily,   R     See Hyperspace for full Linked Orders Report.   10/04/22 0654   10/05/22 0500  CBC  Daily,   R     See Hyperspace for full Linked Orders Report.   10/04/22 0654   10/04/22 1500  Heparin level (unfractionated)  Once-Timed,   TIMED        10/04/22 0654   10/04/22 1000  CBC  Once,   R        10/04/22 0347   10/04/22 0643  Hepatic function panel  Once,   R        10/04/22 0653   10/04/22 0643  Ferritin  Once,   R        10/04/22 0653   10/04/22 [id number]  Iron and TIBC  Once,   R        10/04/22 0653   10/04/22 0642  Type and screen [place]  Once,   R       Comments: [facility]    10/04/22 [id number]   10/04/22 [phone]  Technologist smear review  (Technologist smear review)  Add-on,   AD       Question:  Clinical information:  Answer:  concern for acute bleed at this time   10/04/22 0653   10/04/22 0642  Differential  (Technologist smear review)  Add-on,   AD        10/04/22 0653   10/04/22 0641  Haptoglobin  Once,   R        10/04/22 0653            Vitals/Pain Today's Vitals   10/04/22 0630 10/04/22  0645 10/04/22 0700 10/04/22 0807  BP: 1'06/69 95/69 99/75 '$   Pulse: 99 89 87   Resp: '19 11 18   '$ Temp:      TempSrc:      SpO2: 100% 100% 99%   Weight:      Height:      PainSc:    0-No pain    Isolation Precautions No active isolations  Medications Medications  atorvastatin (LIPITOR) tablet 80 mg (has no administration in time range)  insulin aspart (novoLOG) injection 0-9 Units (100 Units Subcutaneous Not Given 10/04/22 0822)  heparin ADULT infusion 100 units/mL (25000 units/269m) (has no administration in time range)  sodium chloride 0.9 % bolus 500 mL (0 mLs Intravenous Stopped 10/04/22 0152)  iohexol (OMNIPAQUE) 350 MG/ML injection 75 mL (75 mLs Intravenous Contrast Given 10/04/22 [id number]    Mobility walks     Focused Assessments Neuro Assessment Handoff:  Swallow screen pass? Yes  Cardiac Rhythm: Normal sinus rhythm       Neuro Assessment: Within Defined Limits Neuro Checks:      Has TPA been given? No If patient is a Neuro Trauma and patient is going to OR before floor call report to [facility]ataractnurse: [phone] [phone]  R Recommendations: See Admitting Provider Note  Report given to:   Additional Notes: Pt blood pressure is soft it's in the 90's, he is a fall risk. Patient was suppose to be on a Heparin drip they are holding off of it right now. hemoglobin drop from 11.5 to 8.7. They need a CBC to be collected at 10 am to see what his level is, before starting the heparin drip. Vascular surgery is follow this pt. The reason for the drip, because the CT showed renal artery thrombosis and hypoperfusion. You can view the chart and look at the CT.

## 2022-10-04 NOTE — Assessment & Plan Note (Addendum)
Reported melanotic stool during GI prep. Hemoglobin stable at 9.1 s/p 2 units RBC 3/1 & 3/2.   - GI consulted, appreciate recommendations  - Planning EGD and colonoscopy 3/4 - Protonix 40 mg IV ordered BID  - H&H Q12H - Transfusion threshold <8

## 2022-10-04 NOTE — ED Provider Notes (Signed)
[facility] Provider Note   CSN: [id number] Arrival date & time: 10/03/22  1234     History  Chief Complaint  Patient presents with   Dizziness   Weakness    Mike [name] is a 67 y.o. male.  Patient presents with worsening lightheadedness, general weakness shortness of breath worsening since this morning.  Patient has a history of congestive heart failure and is compliant with medications.  Denies any new medicines the past few weeks.  Denies any gross blood in the stools.  No abdominal pain and no current chest pain.  Patient's had mild shortness of breath throughout today.  No history of blood clots.  Patient is on Plavix.  Patient has history of CAD/cholesterol elevation and high blood pressure.       Home Medications Prior to Admission medications   Medication Sig Start Date End Date Taking? Authorizing Provider  aspirin EC 325 MG tablet Take 1 tablet (325 mg total) by mouth daily. 07/19/22 01/15/23  Ganta, Anupa, DO  atorvastatin (LIPITOR) 80 MG tablet Take 1 tablet (80 mg total) by mouth daily. 07/19/22 01/15/23  [name], DO  clopidogrel (PLAVIX) 75 MG tablet Take 1 tablet (75 mg total) by mouth daily. 07/19/22 10/17/22  Ganta, Anupa, DO  metFORMIN (GLUCOPHAGE-XR) 500 MG 24 hr tablet Take 1 tablet (500 mg total) by mouth in the morning and at bedtime. 07/05/22 10/03/22  [name], DO  metoprolol succinate (TOPROL-XL) 25 MG 24 hr tablet Take 1 tablet (25 mg total) by mouth daily. 07/11/22   Jerline Pain, MD  sacubitril-valsartan (ENTRESTO) 24-26 MG Take 1 tablet by mouth 2 (two) times daily. Patient taking differently: Take 0.5 tablets by mouth 2 (two) times daily. 07/11/22   Jerline Pain, MD      Allergies    Patient has no known allergies.    Review of Systems   Review of Systems  Constitutional:  Positive for fatigue. Negative for chills and fever.  HENT:  Negative for congestion.   Eyes:  Negative for visual  disturbance.  Respiratory:  Positive for shortness of breath. Negative for cough.   Cardiovascular:  Negative for chest pain.  Gastrointestinal:  Negative for abdominal pain and vomiting.  Genitourinary:  Negative for dysuria and flank pain.  Musculoskeletal:  Negative for back pain, neck pain and neck stiffness.  Skin:  Negative for rash.  Neurological:  Positive for light-headedness. Negative for headaches.    Physical Exam Updated Vital Signs BP 105/78   Pulse 93   Temp 97.7 F (36.5 C)   Resp 16   Ht '5\' 6"'$  (1.676 m)   Wt 67.8 kg   SpO2 100%   BMI 24.13 kg/m  Physical Exam Vitals and nursing note reviewed.  Constitutional:      General: He is not in acute distress.    Appearance: He is well-developed.  HENT:     Head: Normocephalic and atraumatic.     Mouth/Throat:     Mouth: Mucous membranes are dry.  Eyes:     General:        Right eye: No discharge.        Left eye: No discharge.     Conjunctiva/sclera: Conjunctivae normal.  Neck:     Trachea: No tracheal deviation.  Cardiovascular:     Rate and Rhythm: Normal rate and regular rhythm.     Heart sounds: No murmur heard. Pulmonary:     Effort: Pulmonary effort is normal.  Breath sounds: Normal breath sounds.  Abdominal:     General: There is no distension.     Palpations: Abdomen is soft.     Tenderness: There is no abdominal tenderness. There is no guarding.  Musculoskeletal:        General: No swelling. Normal range of motion.     Cervical back: Normal range of motion and neck supple. No rigidity.  Skin:    General: Skin is warm.     Capillary Refill: Capillary refill takes less than 2 seconds.     Findings: No rash.  Neurological:     General: No focal deficit present.     Mental Status: He is alert.     GCS: GCS eye subscore is 4. GCS verbal subscore is 5. GCS motor subscore is 6.     Cranial Nerves: No cranial nerve deficit.     Comments: General weakness on exam.  No unilateral findings with  arm or leg strength.  Gross sensation intact bilateral.  Pupils equal bilateral.  Psychiatric:        Mood and Affect: Mood normal.     ED Results / Procedures / Treatments   Labs (all labs ordered are listed, but only abnormal results are displayed) Labs Reviewed  BASIC METABOLIC PANEL - Abnormal; Notable for the following components:      Result Value   Glucose, Bld 188 (*)    BUN 45 (*)    All other components within normal limits  CBC - Abnormal; Notable for the following components:   RBC 3.75 (*)    Hemoglobin 11.5 (*)    HCT 34.0 (*)    All other components within normal limits  URINALYSIS, ROUTINE W REFLEX MICROSCOPIC - Abnormal; Notable for the following components:   Ketones, ur 15 (*)    Leukocytes,Ua TRACE (*)    All other components within normal limits  CBG MONITORING, ED - Abnormal; Notable for the following components:   Glucose-Capillary 146 (*)    All other components within normal limits  RESP PANEL BY RT-PCR (RSV, FLU A&B, COVID)  [id number]  URINALYSIS, MICROSCOPIC (REFLEX)  D-DIMER, QUANTITATIVE  MAGNESIUM  TSH  D-DIMER, QUANTITATIVE (NOT AT [facility])  POC OCCULT BLOOD, ED    EKG EKG Interpretation  Date/Time:  Thursday October 03 2022 12:45:08 EST Ventricular Rate:  115 PR Interval:  150 QRS Duration: 88 QT Interval:  328 QTC Calculation: 453 R Axis:   25 Text Interpretation: Sinus tachycardia Possible Inferior infarct , age undetermined ST & T wave abnormality, consider lateral ischemia Abnormal ECG When compared with ECG of 15-Jun-2022 10:55, PREVIOUS ECG IS PRESENT Confirmed by Elnora Morrison [phone]) on 10/03/2022 9:04:28 PM  Radiology DG Chest 1 View  Result Date: 10/03/2022 CLINICAL DATA:  Weakness EXAM: CHEST  1 VIEW COMPARISON:  CXR 06/15/22 FINDINGS: No pleural effusion. No pneumothorax. Normal cardiac and mediastinal contours. No focal airspace opacity. No radiographically apparent displaced rib fractures. Visualized upper abdomen is  unremarkable. IMPRESSION: No active disease. Electronically Signed   By: Marin Roberts M.D.   On: 10/03/2022 13:40    Procedures Procedures    Medications Ordered in ED Medications  sodium chloride 0.9 % bolus 500 mL (500 mLs Intravenous New Bag/Given 10/03/22 2342)    ED Course/ Medical Decision Making/ A&P                             Medical Decision Making Amount and/or Complexity of  Data Reviewed Labs: ordered.   Patient presents with general weakness and clinical concern for presyncope from orthostatic hypotension.  Differential includes anemia, no gross blood in the stools, Hemoccult sent patient's hemoglobin decreased to 11.5 since last blood test in the system.  Electrolytes unremarkable.  Urine test reviewed no signs of infection, mild ketones consistent with dehydration.  No signs of acute pulmonary edema, chest x-ray ordered reviewed no acute abnormalities.  Vital signs reassuring.  Patient very symptomatic with sitting up.  Patient is shortness of breath, was tachycardic 115 on arrival, patient low risk pulm embolism D-dimer sent.  If patient remains symptomatic after IV and oral fluids plan for observation.  Patient care signed out to follow-up D-dimer and reassess. TSH pending.         Final Clinical Impression(s) / ED Diagnoses Final diagnoses:  Postural dizziness with presyncope  Elevated BUN  General weakness    Rx / DC Orders ED Discharge Orders     None         Elnora Morrison, MD 10/04/22 [id number]

## 2022-10-04 NOTE — Progress Notes (Signed)
ANTICOAGULATION CONSULT NOTE - Initial Consult  Pharmacy Consult for heparin Indication:  SMA thrombosis  No Known Allergies  Patient Measurements: Height: '5\' 6"'$  (167.6 cm) Weight: 67.8 kg (149 lb 7.6 oz) IBW/kg (Calculated) : 63.8  Vital Signs: Temp: 97.4 F (36.3 C) (03/01 0617) Temp Source: Oral (03/01 0617) BP: 93/67 (03/01 0617) Pulse Rate: 85 (03/01 0617)  Labs: Recent Labs    10/03/22 1253 10/04/22 0156 10/04/22 0336  HGB 11.5* 8.7*  --   HCT 34.0* 26.4*  --   PLT 291  --   --   CREATININE 1.23  --  1.15    Estimated Creatinine Clearance: 57.8 mL/min (by C-G formula based on SCr of 1.15 mg/dL).   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  Goal of Therapy:  Heparin level 0.3-0.7 units/ml Monitor platelets by anticoagulation protocol: Yes   Plan:  Will defer heparin bolus for now; CBC is being rechecked now. Start heparin infusion at 1100 units/hr. Monitor heparin levels and CBC.  Wynona Neat, PharmD, BCPS  10/04/2022,6:45 AM

## 2022-10-04 NOTE — Assessment & Plan Note (Deleted)
Stable,BP this morning was 95/64. -Monitor BP with routine vitals -Cardiac monitoring -Fall precautions

## 2022-10-04 NOTE — Progress Notes (Signed)
FMTS Interim Progress Note  S: Patient stable, no acute events overnight. He reports persistent lightheadedness. Denies bloody bowel movement.   O: BP 99/75   Pulse 87   Temp (!) 97.4 F (36.3 C) (Oral)   Resp 18   Ht '5\' 6"'$  (1.676 m)   Wt 67.8 kg   SpO2 99%   BMI 24.13 kg/m   Well-appearing, no acute distress Cardio: Regular rate, regular rhythm, no murmurs on exam. Pulm: Clear, no wheezing, no crackles. No increased work of breathing Abdominal: bowel sounds present, soft, non-tender, non-distended Extremities: no peripheral edema  Neuro: alert and oriented x3, speech normal in content, no facial asymmetry  A/P: Orthostatic Hypotension:  BP stable but continues to be low. Possibly related to anemia but still unclear picture at this time.  - echo ordered and pending  - fall precautions  - orthostatic VS pending  - cont cardiac monitoring   Anemia:  Baseline Hgb ~15; last lab value 8.7; patient denies bleeding. No bloody bowel movements reported. No clear source identified.  - CBC pending   Thrombus of Superior Mesenteric Artery:  VSS consulted and recommending CT angio for further evaluation. Also recommended starting heparin.  - checking CBC, PTT, INR '@1000'$  AM  - cont heparin  - ordered CT angio AP, after discussing with nephrology   Right Renal Artery Thrombus w/ Hypoperfusion:  Kidney function stable, Cr 1.15 and GFR >60.  - consulted Nephrology, they will evaluate  Darci Current, DO 10/04/2022, 8:29 AM PGY-1, [facility] Service pager [phone]

## 2022-10-04 NOTE — Assessment & Plan Note (Addendum)
Stable. Patient has not had a great appetite. Requiring only 3u SAI. -sSSI

## 2022-10-04 NOTE — H&P (View-Only) (Signed)
Referring Provider: No ref. provider found Primary Care Physician:  [name], DO Primary Gastroenterologist:  Althia Forts   Reason for Consultation:  Anemia, + FOBT  HPI: Mike [name] is a 67 y.o. male with a past medical history of hypertension, hypercholesterolemia, CHF, CAD s/p MI 2014, CVA 06/2022 and diabetes mellitus type 2.  He presented to the ED 10/03/2022 with nausea, dizziness and weakness upon awakening.  Labs in the ED showed a WBC count of 8.8.  Hemoglobin 11.5 (Hg 15.08 June 2022).  Hematocrit 34.  Platelet 291.  BUN 45 up from 14 August 2022.  Creatinine 1.23.  Sodium 139.  Potassium 4.2.  Magnesium 2.1.  TSH 0.707.  FOBT positive.  SARS coronavirus 2 negative.  D-dimer < 0.27.   Labs today: Hemoglobin 8.4.  Hematocrit 25.2.  Iron 157.  Saturation ratio 71%.  TIBC 220.  Ferritin 167.  Haptoglobin pending.  Total bili 0.9.  Alk phos 63.  AST 27.  ALT 22.  Albumin 2.9.  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 He was seen by vascular surgery who recommended embolic work up to include CTA and ECHO and to start anticoagulation with IV heparin.   CTA showed fibrofatty, wall adherent atherosclerotic plaque about the proximal superior mesenteric artery measuring up to approximately 2.5 cm in resulting in moderate stenosis. The celiac and inferior mesenteric arteries are patent. Severe ostial stenosis of 2 of the 3 right renal arteries, mild diffuse renal cortical atrophy about the right kidney and aortic atherosclerosis.  ECHO was done at the bedside this afternoon, results pending.   A GI consult was requested for further evaluation regarding anemia with positive FOBT. He has voice hoarseness since his stroke 06/2022.  He endorses having he endorses having voice hoarseness since his stroke 06/2022.  He  has dysphagia with liquids only which occurs infrequently over the past 2 to 3 years, last episode was 2 months ago.  No dysphagia with solid foods.  He has heartburn approximately twice weekly for which she takes Alka-Seltzer as needed.  He denies ever having an EGD.  No history of ulcers or GI bleed.  He typically passes a light brown formed stool most days.  Last bowel movement was yesterday.  No rectal bleeding or black stools.  He takes ASA daily 81 mg daily and Plavix since his stroke.  He stated he last took Plavix Wednesday evening 10/02/2022. He denies ever having a colonoscopy.  He has lost 20 pounds over the past 3 to 4 months.  No fevers or night sweats.  Father with history of liver cancer.  Mother with history of ovarian cancer.  He lives by himself.    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 Authorizing Provider  aspirin EC 325 MG tablet Take 1 tablet (325 mg total) by mouth daily. 07/19/22 01/15/23  Ganta, Anupa, DO  atorvastatin (LIPITOR) 80 MG tablet Take 1 tablet (80 mg total) by mouth daily.  07/19/22 01/15/23  [name], DO  clopidogrel (PLAVIX) 75 MG tablet Take 1 tablet (75 mg total) by mouth daily. 07/19/22 10/17/22  Ganta, Anupa, DO  metFORMIN (GLUCOPHAGE-XR) 500 MG 24 hr tablet Take 1 tablet (500 mg total) by mouth in the morning and at bedtime. 07/05/22 10/03/22  [name], DO  metoprolol succinate (TOPROL-XL) 25 MG 24 hr tablet Take 1 tablet (25 mg total) by mouth daily. 07/11/22   Jerline Pain, MD  sacubitril-valsartan (ENTRESTO) 24-26 MG Take 1 tablet by mouth 2 (two) times daily. Patient taking differently: Take 0.5 tablets by mouth 2  (two) times daily. 07/11/22   Jerline Pain, MD    Current Facility-Administered Medications  Medication Dose Route Frequency Provider Last Rate Last Admin   0.9 %  sodium chloride infusion   Intravenous Continuous Rosita Fire, MD       atorvastatin (LIPITOR) tablet 80 mg  80 mg Oral Daily Colletta Maryland, MD       heparin ADULT infusion 100 units/mL (25000 units/253m)  1,100 Units/hr Intravenous Continuous BLaren Everts RPH 11 mL/hr at 10/04/22 1228 1,100 Units/hr at 10/04/22 1228   insulin aspart (novoLOG) injection 0-9 Units  0-9 Units Subcutaneous TID WC HColletta Maryland MD        Allergies as of 10/03/2022   (No Known Allergies)    Family History  Problem Relation Age of Onset   Liver cancer Father    High blood pressure Mother    Ovarian cancer Mother     Social History   Socioeconomic History   Marital status: Legally Separated    Spouse name: Not on file   Number of children: Not on file   Years of education: Not on file   Highest education level: Not on file  Occupational History   Not on file  Tobacco Use   Smoking status: Never   Smokeless tobacco: Not on file  Substance and Sexual Activity   Alcohol use: No   Drug use: No   Sexual activity: Not on file  Other Topics Concern   Not on file  Social History Narrative   Diet: Low Carb   Caffeine: 1 cup of coffee daily   Marital Status: Separated, married 1992   Lives in house, 1 stories, 3 persons, no pets   Current/Past profession: RScientist, clinical (histocompatibility and immunogenetics)   Exercise: No   Living Will: No    DNR- question mark   POA/HPOA- No as of 09/06/14   Social Determinants of Health   Financial Resource Strain: Not on file  Food Insecurity: Not on file  Transportation Needs: Not on file  Physical Activity: Not on file  Stress: Not on file  Social Connections: Not on file  Intimate Partner Violence: Not on file   Review of Systems: Gen: See HPI. CV: Denies chest pain, palpitations or edema. Resp:  Denies cough, shortness of breath of hemoptysis.  GI: Denies heartburn, dysphagia, stomach or lower abdominal pain. No diarrhea or constipation. No rectal bleeding or melena.   GU : Denies urinary burning, blood in urine, increased urinary frequency or incontinence. MS: Denies joint pain, muscles aches or weakness. Derm: Denies rash, itchiness, skin lesions or unhealing ulcers. Psych: Denies depression, anxiety, memory loss or confusion. Heme: Denies easy bruising, bleeding. Neuro:  + Generalized weakness.  Endo: + DM II.   Physical Exam: Vital signs in last 24 hours: Temp:  [97.4 F (36.3 C)-98.5 F (36.9 C)] 98.5 F (36.9 C) (03/01 1200) Pulse Rate:  [  84-99] 95 (03/01 1200) Resp:  [8-19] 18 (03/01 0700) BP: (93-135)/(67-99) 95/72 (03/01 1200) SpO2:  [96 %-100 %] 100 % (03/01 1200)   General: Alert 67 year old male in no acute distress.  Voice is audibly hoarse. Head:  Normocephalic and atraumatic. Eyes:  No scleral icterus. Conjunctiva pink. Ears:  Normal auditory acuity. Nose:  No deformity, discharge or lesions. Mouth: Poor dentition.  No ulcers or lesions.  Neck:  Supple. No lymphadenopathy or thyromegaly.  Lungs: Breath sounds clear throughout. No wheezes, rhonchi or crackles.  Heart: Tachycardic, no murmurs. Abdomen: Soft, nondistended.  Positive bowel sounds all 4 quadrants. Rectal: Deferred. Musculoskeletal:  Symmetrical without gross deformities.  Pulses:  Normal pulses noted. Extremities:  Without clubbing or edema. Neurologic:  Alert and  oriented x 4. No focal deficits.  Skin:  Intact without significant lesions or rashes. Psych:  Alert and cooperative. Normal mood and affect.  Intake/Output from previous day: 02/29 0701 - 03/01 0700 In: 500 [IV Piggyback:500] Out: -  Intake/Output this shift: No intake/output data recorded.  Lab Results: Recent Labs    10/03/22 1253 10/04/22 0156 10/04/22 0857  WBC 8.8  --  6.4  HGB 11.5* 8.7* 8.4*  HCT 34.0* 26.4*  25.2*  PLT 291  --  217   BMET Recent Labs    10/03/22 1253 10/04/22 0336  NA 139 138  K 4.2 4.1  CL 103 107  CO2 25 23  GLUCOSE 188* 122*  BUN 45* 57*  CREATININE 1.23 1.15  CALCIUM 8.9 8.1*   LFT Recent Labs    10/04/22 0857  PROT 5.2*  ALBUMIN 2.9*  AST 27  ALT 22  ALKPHOS 63  BILITOT 0.9  BILIDIR 0.2  IBILI 0.7   PT/INR No results for input(s): "LABPROT", "INR" in the last 72 hours. Hepatitis Panel No results for input(s): "HEPBSAG", "HCVAB", "HEPAIGM", "HEPBIGM" in the last 72 hours.    Studies/Results: CT Angio Abd/Pel w/ and/or w/o  Result Date: 10/04/2022 CLINICAL DATA:  67 year old male with history of mesenteric ischemia and renal artery stenosis. EXAM: CT ANGIOGRAPHY ABDOMEN AND PELVIS WITH CONTRAST AND WITHOUT CONTRAST TECHNIQUE: Multidetector CT imaging of the abdomen and pelvis was performed using the standard protocol during bolus administration of intravenous contrast. Multiplanar reconstructed images and MIPs were obtained and reviewed to evaluate the vascular anatomy. RADIATION DOSE REDUCTION: This exam was performed according to the departmental dose-optimization program which includes automated exposure control, adjustment of the mA and/or kV according to patient size and/or use of iterative reconstruction technique. CONTRAST:  60m OMNIPAQUE IOHEXOL 350 MG/ML SOLN COMPARISON:  11/13/2014, 10/04/2022 FINDINGS: VASCULAR Aorta: Normal caliber aorta without aneurysm, dissection, vasculitis or significant stenosis. Scattered fibrofatty and calcific atherosclerotic changes, most prominent in the infrarenal segment. Celiac: Patent without evidence of aneurysm, dissection, vasculitis or significant stenosis. SMA: Patent ostium. Proximal fibrofatty plaque resulting in moderate stenosis for proximally 2.5 cm. Patent distally. Renals: There are 3 right renal arteries. The upper and interpolar arteries arise from nearly the same location off the abdominal aorta and  demonstrate severe proximal ostial stenoses. The inferior polar artery arises proximal to the IMA ostium and appears patent. Single left renal artery is patent. IMA: Patent without evidence of aneurysm, dissection, vasculitis or significant stenosis. Inflow: Patent without evidence of aneurysm, dissection, vasculitis or significant stenosis. Proximal Outflow: Bilateral common femoral and visualized portions of the superficial and profunda femoral arteries are patent without evidence of aneurysm, dissection, vasculitis or significant stenosis. Veins: The hepatic veins are widely patent. The  portal system is widely patent and normal in caliber. The renal veins are patent bilaterally with retroaortic position of the left renal vein. Infrarenal IVC filter [facility]) is place without evidence of thrombus. The Posterior-most strut appears to abut a prominent anterolateral disc osteophyte at L4-L5. No evidence of iliocaval thrombosis or anomaly. Review of the MIP images confirms the above findings. NON-VASCULAR Lower chest: No acute abnormality. Hepatobiliary: No focal liver abnormality is seen. No gallstones, gallbladder wall thickening, or biliary dilatation. Pancreas: Unremarkable. No pancreatic ductal dilatation or surrounding inflammatory changes. Spleen: Normal in size without focal abnormality. Adrenals/Urinary Tract: Adrenal glands are unremarkable. Mild diffuse right cortical atrophy compared to the left. Unchanged left inferior pole simple cysts. No nephrolithiasis or hydronephrosis. Bladder is unremarkable. Stomach/Bowel: Stomach is within normal limits. Appendix appears normal. No evidence of bowel wall thickening, distention, or inflammatory changes. Lymphatic: No abdominopelvic lymphadenopathy. Reproductive: Prostate is unremarkable. Other: Tiny fat containing umbilical hernia. No ascites or intra-abdominal fluid collections. Musculoskeletal: No acute or significant osseous findings. IMPRESSION: VASCULAR 1.  Fibrofatty, wall adherent atherosclerotic plaque about the proximal superior mesenteric artery measuring up to approximately 2.5 cm in resulting in moderate stenosis. The celiac and inferior mesenteric arteries are patent. 2. Severe ostial stenosis of 2 of the 3 right renal arteries. 3.  Aortic Atherosclerosis (ICD10-I70.0). NON-VASCULAR 1. Mild diffuse renal cortical atrophy about the right kidney, likely secondary to vascular impression number 1 above. 2. No acute abdominopelvic abnormality. Ruthann Cancer, MD [facility] Electronically Signed   By: Ruthann Cancer M.D.   On: 10/04/2022 12:24   CT ABDOMEN PELVIS W WO CONTRAST  Result Date: 10/04/2022 CLINICAL DATA:  Dizziness and weakness. EXAM: CT ABDOMEN AND PELVIS WITHOUT AND WITH CONTRAST TECHNIQUE: Multidetector CT imaging of the abdomen and pelvis was performed following the standard protocol before and following the bolus administration of intravenous contrast. RADIATION DOSE REDUCTION: This exam was performed according to the departmental dose-optimization program which includes automated exposure control, adjustment of the mA and/or kV according to patient size and/or use of iterative reconstruction technique. CONTRAST:  61m OMNIPAQUE IOHEXOL 350 MG/ML SOLN COMPARISON:  November 13, 2014 FINDINGS: Lower chest: No acute abnormality. Hepatobiliary: There is diffuse fatty infiltration of the liver parenchyma. No focal liver abnormality is seen. No gallstones, gallbladder wall thickening, or biliary dilatation. Pancreas: Unremarkable. No pancreatic ductal dilatation or surrounding inflammatory changes. Spleen: Normal in size without focal abnormality. Adrenals/Urinary Tract: Adrenal glands are unremarkable. Kidneys are normal in size, without renal calculi or hydronephrosis. A 13 mm diameter simple cyst is seen within the lower pole of the right kidney. Mild diffuse heterogeneous hypoattenuation of the  right kidney is seen. Mild right-sided perinephric inflammatory fat stranding is also noted. Bladder is unremarkable. Stomach/Bowel: Stomach is within normal limits. Appendix appears normal. A large amount of stool is seen throughout the colon. No evidence of bowel wall thickening, distention, or inflammatory changes. Vascular/Lymphatic: Aortic atherosclerosis. An area of intraluminal low attenuation is seen within the origin of a diminutive right renal artery (axial CT image 31, CT series 3/coronal reformatted images 65 and 66, CT series 11). A small amount of thrombus is also seen within the mid portion of the superior mesenteric artery (axial CT images 29 through 31, CT series 3/coronal reformatted image 55, CT series 11). An inferior vena cava filter is seen. No enlarged abdominal or pelvic lymph nodes. Reproductive: There is mild to moderate severity prostate gland enlargement. Other: No abdominal wall hernia or abnormality.  No abdominopelvic ascites. Musculoskeletal: No acute or significant osseous findings. IMPRESSION: 1. 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 Nephrology consult and further evaluation with conventional angiography is recommended. 2. Thrombus and suspected hemodynamically significant stenosis within the midportion of the superior mesenteric artery. 3. Hepatic steatosis. 4. Enlarged prostate gland. 5. Aortic atherosclerosis. Aortic Atherosclerosis (ICD10-I70.0). Electronically Signed   By: Virgina Norfolk M.D.   On: 10/04/2022 03:39   DG Chest 1 View  Result Date: 10/03/2022 CLINICAL DATA:  Weakness EXAM: CHEST  1 VIEW COMPARISON:  CXR 06/15/22 FINDINGS: No pleural effusion. No pneumothorax. Normal cardiac and mediastinal contours. No focal airspace opacity. No radiographically apparent displaced rib fractures. Visualized upper abdomen is unremarkable. IMPRESSION: No active disease. Electronically Signed   By: Marin Roberts M.D.   On:  10/03/2022 13:40    IMPRESSION/PLAN:  67 year old male admitted to the hospital with generalized weakness which progressively worsened over the past few weeks with decreased po intake. 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 Evaluated by vascular surgery who recommended embolic work up to include CTA and ECHO and to start anticoagulation with IV Heparin. CTA showed fibrofatty, wall adherent atherosclerotic plaque about the proximal superior mesenteric artery measuring up to approximately 2.5 cm in resulting in moderate stenosis, patent celiac and inferior mesenteric arteries. severe ostial stenosis of 2 of the 3 right renal arteries and mild diffuse renal cortical atrophy about the right kidney. ECHO done, results pending. On Heparin infusion.  -Management per the medical service and vascular surgery  Anemia. Hg 11.5 -> 8.4 -> 7.6. One unit of PRBCs ordered, not yet transfused. Iron 157. Ferritin 167. Heme positive stools. On Heparin IV. Plavix on Hold.  -EGD and colonoscopy to rule out upper/lower GI malignancy which may be contributing to anemia and vascular thrombosis as noted above, await further recommendations per Dr. Lorenso Courier -Consider hematology consult  GERD, intermittent dysphagia with liquids only. -Pantoprazole 40 mg IV daily -EGD as noted above   Diabetes mellitus type 2.   History of CVA 06/2022 on ASA and Plavix.  Last dose of Plavix was Wed evening 2/28.  History of CAD, MI 2014.   Patrecia Pour Kennedy-Smith  10/04/2022, 3:19PM

## 2022-10-04 NOTE — ED Provider Notes (Signed)
I assumed care in signout to follow-up on labs.  Patient is an elderly gentleman with multiple medical conditions including CHF. He has evidence of orthostatic hypotension, blood pressures in the 80s upon standing.  He appears clinically dehydrated.  On exam he is awake alert, he answers questions appropriately.  He appears to be neurovascularly intact while resting in the bed.  No arm or leg drift Patient also noted to have anemia of unclear acuity. Patient denies any chest pain at this time.  He reports most of his symptoms are exacerbated by standing  Given his multiple medical conditions including CHF, he will benefit from admission. Judicious fluids will need to be given.  Discussed with family medicine resident for admission   Ripley Fraise, MD 10/04/22 [phone]

## 2022-10-04 NOTE — TOC Initial Note (Signed)
Transition of Care [facility] - Initial/Assessment Note    Patient Details  Name: Mike [name] MRN: [id number] Date of Birth: [birth year]  [facility]) CM/SW Contact:    Verdell Carmine, RN Phone Number: 10/04/2022, 10:28 AM  Clinical Narrative:                    TOC has reviewed the patient information and found no current needs. The patient will be discussed in progression rounds. If a need is identified, please place a TOC consult.     Patient Goals and CMS Choice            Expected Discharge Plan and Services                                              Prior Living Arrangements/Services                       Activities of Daily Living      Permission Sought/Granted                  Emotional Assessment              Admission diagnosis:  Orthostatic hypotension [I95.1] Elevated BUN [R79.9] General weakness [R53.1] Postural dizziness with presyncope [R42, R55] Patient Active Problem List   Diagnosis Date Noted   Orthostatic hypotension [id number]   Chronic systolic heart failure ([place]) 10/04/2022   Anemia 10/04/2022   Mood changes 09/16/2022   Hyperlipidemia associated with type 2 diabetes mellitus ([place]) 06/16/2022   Stroke ([place]) 06/15/2022   Hypertension 06/15/2022   Diabetes ([place]) 06/15/2022   PCP:  [name], DO Pharmacy:   [place] [address] [place] [address] Phone: [phone] Fax: [place] [address] [place] [address] Phone: [phone] Fax: [phone]     Social Determinants of Health (SDOH) Social History: SDOH Screenings   Depression (PHQ2-9): High Risk (09/16/2022)  Tobacco Use: Unknown (10/03/2022)   SDOH Interventions:     Readmission Risk Interventions     No data to display

## 2022-10-04 NOTE — Progress Notes (Addendum)
Initial Nutrition Assessment  DOCUMENTATION CODES:   Not applicable  INTERVENTION:   Multivitamin w/ minerals daily Ensure Enlive po BID, each supplement provides 350 kcal and 20 grams of protein. Liberalize diet to carb modified due to reports of poor PO intake and recent weight loss. Encourage good PO intake   NUTRITION DIAGNOSIS:   Increased nutrient needs related to chronic illness as evidenced by estimated needs.  GOAL:   Patient will meet greater than or equal to 90% of their needs  MONITOR:   PO intake, Supplement acceptance, Labs, I & O's  REASON FOR ASSESSMENT:   Consult Assessment of nutrition requirement/status  ASSESSMENT:   67 y.o. male presented to the ED with dizziness, SOB, nausea, and weakness. PMH includes T2DM, HLD, HTN, CHF, and prior CVA. Pt admitted with hypotension and anemia.   Pt unavailable at both attempt by RD to visit.  No meal intakes recorded with in chart. Per EMR, pt with a 6% weight loss within 3 months, this is not significant for time frame. Although per H&P, pt has reported a 20# weight loss and decreased PO intake since CVA in 06/2022.  RD to order nutrition supplements due to reports of decreased appetite and PO.   Medications reviewed and include: NovoLog SSI Labs reviewed: Sodium 138, Potassium 4.1, BUN 57, Hgb A1c 10.4% (06/16/22)   NUTRITION - FOCUSED PHYSICAL EXAM:  Deferred to follow-up.   Diet Order:   Diet Order             Diet Heart Room service appropriate? Yes; Fluid consistency: Thin  Diet effective now                   EDUCATION NEEDS:   No education needs have been identified at this time  Skin:  Skin Assessment: Reviewed RN Assessment  Last BM:  Unknown  Height:  Ht Readings from Last 1 Encounters:  10/03/22 '5\' 6"'$  (1.676 m)   Weight:  Wt Readings from Last 1 Encounters:  10/03/22 67.8 kg   Ideal Body Weight:  64.6 kg  BMI:  Body mass index is 24.13 kg/m.  Estimated Nutritional Needs:   Kcal:  1800-2000 Protein:  90-110 grams Fluid:  >/= 1.8 L   Hermina Barters RD, LDN Clinical Dietitian See [facility] for contact information.

## 2022-10-04 NOTE — Consult Note (Addendum)
Referring Provider: No ref. provider found Primary Care Physician:  [name], DO Primary Gastroenterologist:  Althia Forts   Reason for Consultation:  Anemia, + FOBT  HPI: Mike [name] is a 67 y.o. male with a past medical history of hypertension, hypercholesterolemia, CHF, CAD s/p MI 2014, CVA 06/2022 and diabetes mellitus type 2.  He presented to the ED 10/03/2022 with nausea, dizziness and weakness upon awakening.  Labs in the ED showed a WBC count of 8.8.  Hemoglobin 11.5 (Hg 15.08 June 2022).  Hematocrit 34.  Platelet 291.  BUN 45 up from 14 August 2022.  Creatinine 1.23.  Sodium 139.  Potassium 4.2.  Magnesium 2.1.  TSH 0.707.  FOBT positive.  SARS coronavirus 2 negative.  D-dimer < 0.27.   Labs today: Hemoglobin 8.4.  Hematocrit 25.2.  Iron 157.  Saturation ratio 71%.  TIBC 220.  Ferritin 167.  Haptoglobin pending.  Total bili 0.9.  Alk phos 63.  AST 27.  ALT 22.  Albumin 2.9.  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 He was seen by vascular surgery who recommended embolic work up to include CTA and ECHO and to start anticoagulation with IV heparin.   CTA showed fibrofatty, wall adherent atherosclerotic plaque about the proximal superior mesenteric artery measuring up to approximately 2.5 cm in resulting in moderate stenosis. The celiac and inferior mesenteric arteries are patent. Severe ostial stenosis of 2 of the 3 right renal arteries, mild diffuse renal cortical atrophy about the right kidney and aortic atherosclerosis.  ECHO was done at the bedside this afternoon, results pending.   A GI consult was requested for further evaluation regarding anemia with positive FOBT. He has voice hoarseness since his stroke 06/2022.  He endorses having he endorses having voice hoarseness since his stroke 06/2022.  He  has dysphagia with liquids only which occurs infrequently over the past 2 to 3 years, last episode was 2 months ago.  No dysphagia with solid foods.  He has heartburn approximately twice weekly for which she takes Alka-Seltzer as needed.  He denies ever having an EGD.  No history of ulcers or GI bleed.  He typically passes a light brown formed stool most days.  Last bowel movement was yesterday.  No rectal bleeding or black stools.  He takes ASA daily 81 mg daily and Plavix since his stroke.  He stated he last took Plavix Wednesday evening 10/02/2022. He denies ever having a colonoscopy.  He has lost 20 pounds over the past 3 to 4 months.  No fevers or night sweats.  Father with history of liver cancer.  Mother with history of ovarian cancer.  He lives by himself.    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 Authorizing Provider  aspirin EC 325 MG tablet Take 1 tablet (325 mg total) by mouth daily. 07/19/22 01/15/23  Ganta, Anupa, DO  atorvastatin (LIPITOR) 80 MG tablet Take 1 tablet (80 mg total) by mouth daily.  07/19/22 01/15/23  [name], DO  clopidogrel (PLAVIX) 75 MG tablet Take 1 tablet (75 mg total) by mouth daily. 07/19/22 10/17/22  Ganta, Anupa, DO  metFORMIN (GLUCOPHAGE-XR) 500 MG 24 hr tablet Take 1 tablet (500 mg total) by mouth in the morning and at bedtime. 07/05/22 10/03/22  [name], DO  metoprolol succinate (TOPROL-XL) 25 MG 24 hr tablet Take 1 tablet (25 mg total) by mouth daily. 07/11/22   Jerline Pain, MD  sacubitril-valsartan (ENTRESTO) 24-26 MG Take 1 tablet by mouth 2 (two) times daily. Patient taking differently: Take 0.5 tablets by mouth 2  (two) times daily. 07/11/22   Jerline Pain, MD    Current Facility-Administered Medications  Medication Dose Route Frequency Provider Last Rate Last Admin   0.9 %  sodium chloride infusion   Intravenous Continuous Rosita Fire, MD       atorvastatin (LIPITOR) tablet 80 mg  80 mg Oral Daily Colletta Maryland, MD       heparin ADULT infusion 100 units/mL (25000 units/235m)  1,100 Units/hr Intravenous Continuous BLaren Everts RPH 11 mL/hr at 10/04/22 1228 1,100 Units/hr at 10/04/22 1228   insulin aspart (novoLOG) injection 0-9 Units  0-9 Units Subcutaneous TID WC HColletta Maryland MD        Allergies as of 10/03/2022   (No Known Allergies)    Family History  Problem Relation Age of Onset   Liver cancer Father    High blood pressure Mother    Ovarian cancer Mother     Social History   Socioeconomic History   Marital status: Legally Separated    Spouse name: Not on file   Number of children: Not on file   Years of education: Not on file   Highest education level: Not on file  Occupational History   Not on file  Tobacco Use   Smoking status: Never   Smokeless tobacco: Not on file  Substance and Sexual Activity   Alcohol use: No   Drug use: No   Sexual activity: Not on file  Other Topics Concern   Not on file  Social History Narrative   Diet: Low Carb   Caffeine: 1 cup of coffee daily   Marital Status: Separated, married 1992   Lives in house, 1 stories, 3 persons, no pets   Current/Past profession: RScientist, clinical (histocompatibility and immunogenetics)   Exercise: No   Living Will: No    DNR- question mark   POA/HPOA- No as of 09/06/14   Social Determinants of Health   Financial Resource Strain: Not on file  Food Insecurity: Not on file  Transportation Needs: Not on file  Physical Activity: Not on file  Stress: Not on file  Social Connections: Not on file  Intimate Partner Violence: Not on file   Review of Systems: Gen: See HPI. CV: Denies chest pain, palpitations or edema. Resp:  Denies cough, shortness of breath of hemoptysis.  GI: Denies heartburn, dysphagia, stomach or lower abdominal pain. No diarrhea or constipation. No rectal bleeding or melena.   GU : Denies urinary burning, blood in urine, increased urinary frequency or incontinence. MS: Denies joint pain, muscles aches or weakness. Derm: Denies rash, itchiness, skin lesions or unhealing ulcers. Psych: Denies depression, anxiety, memory loss or confusion. Heme: Denies easy bruising, bleeding. Neuro:  + Generalized weakness.  Endo: + DM II.   Physical Exam: Vital signs in last 24 hours: Temp:  [97.4 F (36.3 C)-98.5 F (36.9 C)] 98.5 F (36.9 C) (03/01 1200) Pulse Rate:  [  84-99] 95 (03/01 1200) Resp:  [8-19] 18 (03/01 0700) BP: (93-135)/(67-99) 95/72 (03/01 1200) SpO2:  [96 %-100 %] 100 % (03/01 1200)   General: Alert 67 year old male in no acute distress.  Voice is audibly hoarse. Head:  Normocephalic and atraumatic. Eyes:  No scleral icterus. Conjunctiva pink. Ears:  Normal auditory acuity. Nose:  No deformity, discharge or lesions. Mouth: Poor dentition.  No ulcers or lesions.  Neck:  Supple. No lymphadenopathy or thyromegaly.  Lungs: Breath sounds clear throughout. No wheezes, rhonchi or crackles.  Heart: Tachycardic, no murmurs. Abdomen: Soft, nondistended.  Positive bowel sounds all 4 quadrants. Rectal: Deferred. Musculoskeletal:  Symmetrical without gross deformities.  Pulses:  Normal pulses noted. Extremities:  Without clubbing or edema. Neurologic:  Alert and  oriented x 4. No focal deficits.  Skin:  Intact without significant lesions or rashes. Psych:  Alert and cooperative. Normal mood and affect.  Intake/Output from previous day: 02/29 0701 - 03/01 0700 In: 500 [IV Piggyback:500] Out: -  Intake/Output this shift: No intake/output data recorded.  Lab Results: Recent Labs    10/03/22 1253 10/04/22 0156 10/04/22 0857  WBC 8.8  --  6.4  HGB 11.5* 8.7* 8.4*  HCT 34.0* 26.4*  25.2*  PLT 291  --  217   BMET Recent Labs    10/03/22 1253 10/04/22 0336  NA 139 138  K 4.2 4.1  CL 103 107  CO2 25 23  GLUCOSE 188* 122*  BUN 45* 57*  CREATININE 1.23 1.15  CALCIUM 8.9 8.1*   LFT Recent Labs    10/04/22 0857  PROT 5.2*  ALBUMIN 2.9*  AST 27  ALT 22  ALKPHOS 63  BILITOT 0.9  BILIDIR 0.2  IBILI 0.7   PT/INR No results for input(s): "LABPROT", "INR" in the last 72 hours. Hepatitis Panel No results for input(s): "HEPBSAG", "HCVAB", "HEPAIGM", "HEPBIGM" in the last 72 hours.    Studies/Results: CT Angio Abd/Pel w/ and/or w/o  Result Date: 10/04/2022 CLINICAL DATA:  67 year old male with history of mesenteric ischemia and renal artery stenosis. EXAM: CT ANGIOGRAPHY ABDOMEN AND PELVIS WITH CONTRAST AND WITHOUT CONTRAST TECHNIQUE: Multidetector CT imaging of the abdomen and pelvis was performed using the standard protocol during bolus administration of intravenous contrast. Multiplanar reconstructed images and MIPs were obtained and reviewed to evaluate the vascular anatomy. RADIATION DOSE REDUCTION: This exam was performed according to the departmental dose-optimization program which includes automated exposure control, adjustment of the mA and/or kV according to patient size and/or use of iterative reconstruction technique. CONTRAST:  46m OMNIPAQUE IOHEXOL 350 MG/ML SOLN COMPARISON:  11/13/2014, 10/04/2022 FINDINGS: VASCULAR Aorta: Normal caliber aorta without aneurysm, dissection, vasculitis or significant stenosis. Scattered fibrofatty and calcific atherosclerotic changes, most prominent in the infrarenal segment. Celiac: Patent without evidence of aneurysm, dissection, vasculitis or significant stenosis. SMA: Patent ostium. Proximal fibrofatty plaque resulting in moderate stenosis for proximally 2.5 cm. Patent distally. Renals: There are 3 right renal arteries. The upper and interpolar arteries arise from nearly the same location off the abdominal aorta and  demonstrate severe proximal ostial stenoses. The inferior polar artery arises proximal to the IMA ostium and appears patent. Single left renal artery is patent. IMA: Patent without evidence of aneurysm, dissection, vasculitis or significant stenosis. Inflow: Patent without evidence of aneurysm, dissection, vasculitis or significant stenosis. Proximal Outflow: Bilateral common femoral and visualized portions of the superficial and profunda femoral arteries are patent without evidence of aneurysm, dissection, vasculitis or significant stenosis. Veins: The hepatic veins are widely patent. The  portal system is widely patent and normal in caliber. The renal veins are patent bilaterally with retroaortic position of the left renal vein. Infrarenal IVC filter [facility]) is place without evidence of thrombus. The Posterior-most strut appears to abut a prominent anterolateral disc osteophyte at L4-L5. No evidence of iliocaval thrombosis or anomaly. Review of the MIP images confirms the above findings. NON-VASCULAR Lower chest: No acute abnormality. Hepatobiliary: No focal liver abnormality is seen. No gallstones, gallbladder wall thickening, or biliary dilatation. Pancreas: Unremarkable. No pancreatic ductal dilatation or surrounding inflammatory changes. Spleen: Normal in size without focal abnormality. Adrenals/Urinary Tract: Adrenal glands are unremarkable. Mild diffuse right cortical atrophy compared to the left. Unchanged left inferior pole simple cysts. No nephrolithiasis or hydronephrosis. Bladder is unremarkable. Stomach/Bowel: Stomach is within normal limits. Appendix appears normal. No evidence of bowel wall thickening, distention, or inflammatory changes. Lymphatic: No abdominopelvic lymphadenopathy. Reproductive: Prostate is unremarkable. Other: Tiny fat containing umbilical hernia. No ascites or intra-abdominal fluid collections. Musculoskeletal: No acute or significant osseous findings. IMPRESSION: VASCULAR 1.  Fibrofatty, wall adherent atherosclerotic plaque about the proximal superior mesenteric artery measuring up to approximately 2.5 cm in resulting in moderate stenosis. The celiac and inferior mesenteric arteries are patent. 2. Severe ostial stenosis of 2 of the 3 right renal arteries. 3.  Aortic Atherosclerosis (ICD10-I70.0). NON-VASCULAR 1. Mild diffuse renal cortical atrophy about the right kidney, likely secondary to vascular impression number 1 above. 2. No acute abdominopelvic abnormality. Ruthann Cancer, MD [facility] Radiology Electronically Signed   By: Ruthann Cancer M.D.   On: 10/04/2022 12:24   CT ABDOMEN PELVIS W WO CONTRAST  Result Date: 10/04/2022 CLINICAL DATA:  Dizziness and weakness. EXAM: CT ABDOMEN AND PELVIS WITHOUT AND WITH CONTRAST TECHNIQUE: Multidetector CT imaging of the abdomen and pelvis was performed following the standard protocol before and following the bolus administration of intravenous contrast. RADIATION DOSE REDUCTION: This exam was performed according to the departmental dose-optimization program which includes automated exposure control, adjustment of the mA and/or kV according to patient size and/or use of iterative reconstruction technique. CONTRAST:  51m OMNIPAQUE IOHEXOL 350 MG/ML SOLN COMPARISON:  November 13, 2014 FINDINGS: Lower chest: No acute abnormality. Hepatobiliary: There is diffuse fatty infiltration of the liver parenchyma. No focal liver abnormality is seen. No gallstones, gallbladder wall thickening, or biliary dilatation. Pancreas: Unremarkable. No pancreatic ductal dilatation or surrounding inflammatory changes. Spleen: Normal in size without focal abnormality. Adrenals/Urinary Tract: Adrenal glands are unremarkable. Kidneys are normal in size, without renal calculi or hydronephrosis. A 13 mm diameter simple cyst is seen within the lower pole of the right kidney. Mild diffuse heterogeneous hypoattenuation of the  right kidney is seen. Mild right-sided perinephric inflammatory fat stranding is also noted. Bladder is unremarkable. Stomach/Bowel: Stomach is within normal limits. Appendix appears normal. A large amount of stool is seen throughout the colon. No evidence of bowel wall thickening, distention, or inflammatory changes. Vascular/Lymphatic: Aortic atherosclerosis. An area of intraluminal low attenuation is seen within the origin of a diminutive right renal artery (axial CT image 31, CT series 3/coronal reformatted images 65 and 66, CT series 11). A small amount of thrombus is also seen within the mid portion of the superior mesenteric artery (axial CT images 29 through 31, CT series 3/coronal reformatted image 55, CT series 11). An inferior vena cava filter is seen. No enlarged abdominal or pelvic lymph nodes. Reproductive: There is mild to moderate severity prostate gland enlargement. Other: No abdominal wall hernia or abnormality.  No abdominopelvic ascites. Musculoskeletal: No acute or significant osseous findings. IMPRESSION: 1. 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 Nephrology consult and further evaluation with conventional angiography is recommended. 2. Thrombus and suspected hemodynamically significant stenosis within the midportion of the superior mesenteric artery. 3. Hepatic steatosis. 4. Enlarged prostate gland. 5. Aortic atherosclerosis. Aortic Atherosclerosis (ICD10-I70.0). Electronically Signed   By: Virgina Norfolk M.D.   On: 10/04/2022 03:39   DG Chest 1 View  Result Date: 10/03/2022 CLINICAL DATA:  Weakness EXAM: CHEST  1 VIEW COMPARISON:  CXR 06/15/22 FINDINGS: No pleural effusion. No pneumothorax. Normal cardiac and mediastinal contours. No focal airspace opacity. No radiographically apparent displaced rib fractures. Visualized upper abdomen is unremarkable. IMPRESSION: No active disease. Electronically Signed   By: Marin Roberts M.D.   On:  10/03/2022 13:40    IMPRESSION/PLAN:  67 year old male admitted to the hospital with generalized weakness which progressively worsened over the past few weeks with decreased po intake. 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 Evaluated by vascular surgery who recommended embolic work up to include CTA and ECHO and to start anticoagulation with IV Heparin. CTA showed fibrofatty, wall adherent atherosclerotic plaque about the proximal superior mesenteric artery measuring up to approximately 2.5 cm in resulting in moderate stenosis, patent celiac and inferior mesenteric arteries. severe ostial stenosis of 2 of the 3 right renal arteries and mild diffuse renal cortical atrophy about the right kidney. ECHO done, results pending. On Heparin infusion.  -Management per the medical service and vascular surgery  Anemia. Hg 11.5 -> 8.4 -> 7.6. One unit of PRBCs ordered, not yet transfused. Iron 157. Ferritin 167. Heme positive stools. On Heparin IV. Plavix on Hold.  -EGD and colonoscopy to rule out upper/lower GI malignancy which may be contributing to anemia and vascular thrombosis as noted above, await further recommendations per Dr. Lorenso Courier -Consider hematology consult  GERD, intermittent dysphagia with liquids only. -Pantoprazole 40 mg IV daily -EGD as noted above   Diabetes mellitus type 2.   History of CVA 06/2022 on ASA and Plavix.  Last dose of Plavix was Wed evening 2/28.  History of CAD, MI 2014.   Patrecia Pour Kennedy-Smith  10/04/2022, 3:19PM

## 2022-10-04 NOTE — H&P (Addendum)
Hospital Admission History and Physical Service Pager: [phone]  Patient name: Mike [name] Medical record number: [id number] Date of Birth: 28-Aug-1955 Age: 67 y.o. Gender: male  Primary Care Provider: [name], DO Consultants: None Code Status: Full code Preferred Emergency Contact:  Contact Information     Name Relation Home Work [place] Brother [phone]  [phone]      Chief Complaint: Dizziness  Assessment and Plan: Tomio Lamia is a 67 y.o. male presenting with orthostatic hypotension, SOB and weakness. Differential for this patient's presentation of this includes acute bleed (precipitous drop in Hgb), dehydration (decreased PO intake recently, dehydrated upon exam), PE (d-dimer negative), PNA (denies fever, cough, congestion), CVA (without focal neurological deficit), ACS (neg EKG), hypothyroidism (normal TSH).  * Orthostatic hypotension BP dropped to 80s/60s upon standing in the ED. Patient symptomatic with dizziness, SOB with exertion and weakness. Report episodes started this AM with unknown trigger. Decreased PO intake and weight loss ~20lbs due to lack of taste since CVA in 06/2022, possible malnutrition component. S/p 570m NS bolus in the ED, gentle fluid resuscitation given HF history. D-dimer negative. -Admitted to [facility], Dr. CErin Hearing med tele unit -Orthostatic vitals -Nutrition consult -Cardiac monitoring -PT/OT consulted -Consider additional fluids pending echo -Fall precautions  Anemia Precipitous drop in Hgb from 11.5 upon admission to 8.7. Baseline Hgb ~15-16. Denies melena or acute rectal bleeding. Stat CT abdomen/pelvis order to r/o acute bleed. Would benefit from colonoscopy at some point given he has never had this done. -F/u CT abdomen/pelvis  Chronic systolic heart failure (HCC) EF 30-35% in 06/2022. Follow with [facility], compliant on Entresto and Metoprolol. -Echo -Hold home med Entresto and Metoprolol in the setting  of orthostatic hypotension  Diabetes ([place] Recent A1c 7.3 in 09/2021, well controlled on Metformin '500mg'$  BID. -sSSI  Chronic conditions: HLD: continue Lipitor '80mg'$  CVA: hold ASA and Plavix in setting of concern for acute bleed  FEN/GI: Heart healthy diet VTE Prophylaxis: Lovenox  Disposition: Med telemetry  History of Present Illness:  FManpreet [name] a 67y.o. male presenting with weakness, dizziness upon standing, SOB with exertion and nausea that started this morning. States he has been feeling week since his hospitalization in 06/2022 when he was diagnosed with a CVA. States he goes to walk at the park and afterwards all his limbs feel heavy and he is fatigued. Related since that time he has been eating less since he has a metallic taste with food. Relates he has lost about 20 pounds because of this. This AM his symptoms acutely worsened without trigger. Denies fever, vomiting or diarrhea. Denies abdominal pain, blood in stool or other sources of bleeding. Denies HA or focal deficit. Denies chest pain or pressure.  In the ED, patient with orthostatic hypotension, given 500cc NS bolus (HF history). Hgb 11.5, down from 15.4 previously. Remainder of lab work unremarkable. Clinically patient appeared unwell, admitted for further work up.  Review Of Systems: Per HPI above  Pertinent Past Medical History: HTN T2DM H/o CVA in 06/2022 HLD Chronic systolic HF Remainder reviewed in history tab.   Pertinent Past Surgical History: None Remainder reviewed in history tab.   Pertinent Social History: Tobacco use: None Alcohol use: Occasionally Other Substance use: None Lives with alone  Pertinent Family History: Mother: HTN Remainder reviewed in history tab.   Important Outpatient Medications: ASA 325 Lipitor '80mg'$  Plavix '75mg'$  Metformin '500mg'$  BID Metoprolol '25mg'$  Entresto 24-26 BID Remainder reviewed in medication history.   Objective: BP 121/78  Pulse 84   Temp 97.7 F  (36.5 C)   Resp 16   Ht '5\' 6"'$  (1.676 m)   Wt 67.8 kg   SpO2 100%   BMI 24.13 kg/m  Exam: General: Thin male, fatigued-appearing, alert, NAD Eyes: Mild L eye midline deviation consistent throughout exam. Anicteric sclera ENTM: Dry lips. Neck: Supple, non-tender Cardiovascular: RRR without murmur Respiratory: CTAB. Normal WOB on RA. No wheezing Gastrointestinal: Soft, non-tender, non-distended MSK: No peripheral edema Derm: Warm, dry, no rashes noted Neuro: CN intact. Motor and sensation intact globally Psych: Cooperative, pleasant   Labs:  CBC BMET  Recent Labs  Lab 10/03/22 1253 10/04/22 0156  WBC 8.8  --   HGB 11.5* 8.7*  HCT 34.0* 26.4*  PLT 291  --    Recent Labs  Lab 10/03/22 1253  NA 139  K 4.2  CL 103  CO2 25  BUN 45*  CREATININE 1.23  GLUCOSE 188*  CALCIUM 8.9    Pertinent additional labs: Mag: 2.1 D-dimer: <0.27 TSH: 0.707 UA: Ketone 15 FOBT: positive  EKG: Sinus tachycardia  Imaging Studies Performed: DG Chest 1 View Result Date: 10/03/2022 IMPRESSION: No active disease.    Colletta Maryland, MD 10/04/2022, 3:15 AM PGY-1, [place] Intern pager: [phone], text pages welcome Secure chat group [place] Upper-Level Resident Addendum   I have independently interviewed and examined the patient. I have discussed the above with Dr. Jerilee Hoh and agree with the documented plan. My edits for correction/addition/clarification are included above. Please see any attending notes.   Wells Guiles, DO PGY-2, [facility] 10/04/2022 3:32 AM  FPTS Service pager: [phone] (text pages welcome through [facility])

## 2022-10-04 NOTE — Evaluation (Signed)
Occupational Therapy Evaluation Patient Details Name: Mike [name] MRN: [id number] DOB: 11-Mar-1956 Today's Date: 10/04/2022   History of Present Illness 67 y.o. male presenting with orthostatic hypotension, SOB and weakness. 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 Pt states he finished outpt OT/PT in January due to deficits from his CVA in 11/23 and was still undergoing ST (last treatment was suppose to be 2/29). Pt states "I still don't feel the same". HR in the 140s with standing and pt complaining of feeling "lightheaded". BP supine 106/81; sitting 100/81; standing 105/83. Pt became very tearful and began crying. States he feels "very depressed". Recommend he discuss these depressed feelings with his physician. Feel he would benefit from a Spiritual Care consult. Acute OT to follow to facilitate a safe DC home, however do not anticipate he will need OT follow up.      Recommendations for follow up therapy are one component of a multi-disciplinary discharge planning process, led by the attending physician.  Recommendations may be updated based on patient status, additional functional criteria and insurance authorization.   Follow Up Recommendations  No OT follow up (pending progress)     Assistance Recommended at Discharge Intermittent Supervision/Assistance  Patient can return home with the following Assistance with cooking/housework;Assist for transportation    Functional Status Assessment  Patient has had a recent decline in their functional status and demonstrates the ability to make significant improvements in function in a reasonable and predictable amount of time.  Equipment Recommendations  None recommended by OT    Recommendations for Other Services  (Spiritual  Care consult; psych to assess for depression?)     Precautions / Restrictions Precautions Precautions: Fall      Mobility Bed Mobility Overal bed mobility: Modified Independent                  Transfers Overall transfer level: Needs assistance Equipment used: 1 person hand held assist Transfers: Sit to/from Stand Sit to Stand: Min guard                  Balance Overall balance assessment: Mild deficits observed, not formally tested (last fall in 11/23 when he had his stroke)                                         ADL either performed or assessed with clinical judgement   ADL Overall ADL's : Needs assistance/impaired                                     Functional mobility during ADLs: Min guard General ADL Comments: currently requires set up/S for ADL; limited as pt with HR in the 140s     Vision Baseline Vision/History: 1 Wears glasses (not for driving) Vision Assessment?:  (at [place])     Perception     Praxis      Pertinent Vitals/Pain Pain Assessment Pain Assessment: No/denies pain     Hand Dominance Right   Extremity/Trunk Assessment Upper Extremity Assessment Upper Extremity Assessment: Overall WFL for tasks assessed   Lower Extremity Assessment Lower Extremity Assessment: Defer to PT  evaluation       Communication Communication Communication: Prefers language other than [place];Other (comment) (fluent in [place])   Cognition Arousal/Alertness: Awake/alert Behavior During Therapy:  (began crying) Overall Cognitive Status:  (appears at [place])                                 General Comments: Pt began crying toward end of session and states he feels very depressed     General Comments       Exercises     Shoulder Instructions      Home Living Family/patient expects to be discharged to:: Private residence Living Arrangements: Alone Available Help at Discharge:  Family Type of Home: Apartment Home Access: Stairs to enter Technical brewer of Steps: 2 Entrance Stairs-Rails: None Home Layout: One level     Bathroom Shower/Tub: Teacher, early years/pre: Standard     Home Equipment: Conservation officer, nature (2 wheels)   Additional Comments: states brother can stay with them if needed at DC      Prior Functioning/Environment Prior Level of Function : Independent/Modified Independent             Mobility Comments: No AD ADLs Comments: Has brother provide transportation or takes bus or uber. Finished outpt OT/PT in January adn was undergoig ST - last appointment was suppose to be 2/29 however he was in the hospital        OT Problem List: Decreased activity tolerance;Cardiopulmonary status limiting activity      OT Treatment/Interventions: Self-care/ADL training;Therapeutic exercise;Energy conservation;DME and/or AE instruction;Therapeutic activities;Patient/family education    OT Goals(Current goals can be found in the care plan section) Acute Rehab OT Goals Patient Stated Goal: to get better OT Goal Formulation: With patient Time For Goal Achievement: 10/18/22 Potential to Achieve Goals: Good  OT Frequency: Min 2X/week    Co-evaluation              AM-PAC OT "6 Clicks" Daily Activity     Outcome Measure Help from another person eating meals?: None Help from another person taking care of personal grooming?: A Little Help from another person toileting, which includes using toliet, bedpan, or urinal?: A Little Help from another person bathing (including washing, rinsing, drying)?: A Little Help from another person to put on and taking off regular upper body clothing?: A Little Help from another person to put on and taking off regular lower body clothing?: A Little 6 Click Score: 19   End of Session Nurse Communication: Mobility status;Other (comment) (affect)  Activity Tolerance: Other (comment) (limited by HR in  the 140s) Patient left: in bed;with call bell/phone within reach;with bed alarm set;with family/visitor present  OT Visit Diagnosis: Unsteadiness on feet (R26.81);Muscle weakness (generalized) (M62.81)                Time: [id number] OT Time Calculation (min): 22 min Charges:  OT General Charges $OT Visit: 1 Visit OT Evaluation $OT Eval Moderate Complexity: [place], OT/L   Acute OT Clinical Specialist Acute Rehabilitation Services Pager [phone] Office [phone]   [facility] 10/04/2022, 11:10 AM

## 2022-10-04 NOTE — Progress Notes (Signed)
  Echocardiogram 2D Echocardiogram has been performed.  Ronny Flurry 10/04/2022, 2:37 PM

## 2022-10-04 NOTE — Assessment & Plan Note (Addendum)
Echo yesterday showed mildly improved EF of 35 to 40% from last year with left ventricular global hypokinesis moderately decreased function. -Hold home med Entresto and Metoprolol in the setting of orthostatic hypotension -Follow-up with heart care outpatient.

## 2022-10-04 NOTE — Consult Note (Addendum)
[facility] Nephrology Consultation Note  Requesting MD: Dr. Andrena Mews Reason for consult: AKI   HPI:  Mike [name] is a 67 y.o. male with history of DM, hypertension, HLD, nephrolithiasis presented with orthostatic hypotension, generalized weakness and shortness of breath, seen as a consultation for the management of elevated creatinine level and abnormal CT scan finding of right artery thrombosis.  The patient had CT scan of abdomen pelvis which was consistent with right renal artery thrombosis and hypoperfusion of the right kidney.  Already seen by vascular surgeon who recommended CT angio.  The patient had CT angio this morning which was consistent with ostial stenosis of right renal arteries and mild diffuse renal cortical atrophy presumably due to poor circulation to the right kidney.  He is currently on IV heparin. The labs showed creatinine level of 1.15 today from 1.23 yesterday.  The baseline creatinine level seems to be around 0.7 - 0.8. UA was bland with no proteinuria.  Home medication including aspirin, statin, Plavix, metformin, metoprolol and Entresto. He is lying on bed comfortable.  He denies any headache, dizziness, nausea, vomiting, chest pain, shortness of breath.  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 He does not have any smokeless tobacco history on file. He reports that he does not drink alcohol and does not use drugs.  Allergies: No Known Allergies  Medications: Prior to  Admission medications   Medication Sig Start Date End Date Taking? Authorizing Provider  aspirin EC 325 MG tablet Take 1 tablet (325 mg total) by mouth daily. 07/19/22 01/15/23  Ganta, Anupa, DO  atorvastatin (LIPITOR) 80 MG tablet Take 1 tablet (80 mg total) by mouth daily. 07/19/22 01/15/23  [name], DO  clopidogrel (PLAVIX) 75 MG tablet Take 1 tablet (75 mg total) by mouth daily. 07/19/22 10/17/22  Ganta, Anupa, DO  metFORMIN (GLUCOPHAGE-XR) 500 MG 24 hr tablet Take 1 tablet (500 mg total) by mouth in the morning and at bedtime. 07/05/22 10/03/22  [name], DO  metoprolol succinate (TOPROL-XL) 25 MG 24 hr tablet Take 1 tablet (25 mg total) by mouth daily. 07/11/22   Jerline Pain, MD  sacubitril-valsartan (ENTRESTO) 24-26 MG Take 1 tablet by mouth 2 (two) times daily. Patient taking differently: Take 0.5 tablets by mouth 2 (two) times daily. 07/11/22   Jerline Pain, MD    I have reviewed the patient's current medications.  Labs: Renal Panel: Recent Labs  Lab 10/03/22 1253 10/03/22 2340 10/04/22 0336  NA 139  --  138  K 4.2  --  4.1  CL 103  --  107  CO2 25  --  23  GLUCOSE 188*  --  122*  BUN 45*  --  57*  CREATININE 1.23  --  1.15  CALCIUM 8.9  --  8.1*  MG  --  2.1  --      CBC:  Latest Ref Rng & Units 10/04/2022    8:57 AM 10/04/2022    1:56 AM 10/03/2022   12:53 PM  CBC  WBC 4.0 - 10.5 K/uL 6.4   8.8   Hemoglobin 13.0 - 17.0 g/dL 8.4  8.7  11.5   Hematocrit 39.0 - 52.0 % 25.2  26.4  34.0   Platelets 150 - 400 K/uL 217   291      Anemia Panel:  Recent Labs    06/15/22 1103 06/16/22 0300 06/16/22 0904 10/03/22 1253 10/04/22 0156 10/04/22 0857  HGB 17.0 15.4  --  11.5* 8.7* 8.4*  MCV  --  88.7  --  90.7  --  92.0  VITAMINB12  --   --  218  --   --   --   FERRITIN  --   --   --   --   --  167  TIBC  --   --   --   --   --  220*  IRON  --   --   --   --   --  157    Recent Labs  Lab 10/04/22 0857  AST 27  ALT 22  ALKPHOS 63  BILITOT 0.9  PROT  5.2*  ALBUMIN 2.9*    Lab Results  Component Value Date   HGBA1C 7.3 (A) 09/16/2022    ROS:  Pertinent items noted in HPI and remainder of comprehensive ROS otherwise negative.  Physical Exam: Vitals:   10/04/22 0700 10/04/22 1200  BP: 99/75 95/72  Pulse: 87 95  Resp: 18   Temp:  98.5 F (36.9 C)  SpO2: 99% 100%     General exam: Appears calm and comfortable  Respiratory system: Clear to auscultation. Respiratory effort normal. No wheezing or crackle Cardiovascular system: S1 & S2 heard, RRR.  No pedal edema. Gastrointestinal system: Abdomen is nondistended, soft and nontender. Normal bowel sounds heard. Central nervous system: Alert and oriented. No focal neurological deficits. Extremities: Symmetric 5 x 5 power. Skin: No rashes, lesions or ulcers Psychiatry: Judgement and insight appear normal. Mood & affect appropriate.   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  Already had CT angio done with severe ostial stenosis of right renal arteries.  Seen by vascular surgeon.  Currently on heparin and hypercoagulation workup per primary team and vascular surgeon.  # Acute kidney injury: Baseline creatinine level seems to be around 0.7-0.8, peak creatinine level of 1.2 yesterday probably hemodynamically mediated due to hypotension/pre-renal.  UA bland, no proteinuria.  Creatinine level trending down.  He got IV contrast this morning therefore I will start IV fluid for about a liter.  Strict ins and out, monitor BMP.  # Orthostatic hypotension: Plan for echo noted.  Monitor BP.  # Anemia: Iron saturation acceptable.  Workup per primary team.  Thank you for the consult. Nothing further to add, I will sign off.  Please call us back with question.   Daylyn Christine Tanna Furry 10/04/2022, 1:18 PM  [employer].

## 2022-10-04 NOTE — Progress Notes (Signed)
FMTS Interim Progress Note  S: Patient assessed at bedside with Dr. Joelyn Oms. Patient states he feels better after receiving the unit of blood. Report he does continue to have dizziness upon standing. Denies blood in his stool (had 2 BMs today). Reports new onset visual hallucinations of "comics" this evening. They have since gone away. Denies confusion and auditory hallucinations.  O: BP 100/71 (BP Location: Left Arm)   Pulse 99   Temp 97.8 F (36.6 C) (Oral)   Resp 15   Ht '5\' 6"'$  (1.676 m)   Wt 67.8 kg   SpO2 99%   BMI 24.13 kg/m    General: Alert. NAD CV: RRR without murmur Pulm: CTAB. Normal WOB on RA. No wheezing Abdomen: Soft, non-tender, non-distended Ext: Well perfused. No peripheral edema  A/P: Orthostatic hypotension Reports continued dizziness upon standing even after 1 unit pRBC and fluids. Echo showed EF 35-40% with global hypokinesis, no valvular concerns. -D/c MIVF given HF history  Anemia S/p 1 unit pRBC. Post H&H showed Hgb 8.7. 2 BM today, no blood present or black tarry stools. EGD/colonoscopy planned for 3/4. -H&H q6h -Ordered Protonix IV  Sundowning Endorses visual hallucinations early this evening. Denies auditory hallucination and confusion. Well oriented to medical condition upon exam. -Delirium precautions  Remainder of plan per day team.  Colletta Maryland, MD 10/04/2022, 10:33 PM PGY-1, [place] Medicine Service pager [phone]

## 2022-10-04 NOTE — Consult Note (Signed)
[facility] OF [place]  ASSESSMENT / PLAN: 67 y.o. male admitted to the hospital for evaluation of syncope / generalized weakness. He reports poor oral intake over the past months. CT scan with venous phase constrast shows thrombus vs. Mixed density plaque in mesenteric and renal arteries. He does not report any symptoms typical of acute or chronic mesenteric ischemia. He has no evidence of acute renal failure. His creatinine is elevated over his baseline.   Will need embolic workup. Needs CT angiogram. Needs echocardiogram. Recommend anticoagulation with heparin while workup proceeds. No urgent need for intervention given lack of clinical symptoms. Will follow along with you.   CHIEF COMPLAINT: weakness  HISTORY OF PRESENT ILLNESS: Mike [name] is a 67 y.o. male admitted to the hospital for evaluation of generalized weakness.  The patient reports weakness and low energy over the past several weeks.  He reports diminished oral intake.  He reports he is only been eating fruit.  When questioned specifically further, he reports no postprandial pain, food fear.  He does endorse a 20 pound weight loss since his last admission in November.  He denies abdominal pain.  He denies nausea.  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     Past Surgical History:  Procedure Laterality Date   CORONARY ANGIOPLASTY WITH STENT PLACEMENT     VENA CAVA FILTER PLACEMENT      Family History  Problem Relation Age of Onset   Liver cancer Father    High blood pressure Mother    Ovarian cancer Mother     Social History   Socioeconomic History   Marital status: Legally Separated    Spouse name: Not on file   Number of children: Not on file   Years of education: Not on file   Highest education level: Not on file  Occupational History   Not on file  Tobacco Use    Smoking status: Never   Smokeless tobacco: Not on file  Substance and Sexual Activity   Alcohol use: No   Drug use: No   Sexual activity: Not on file  Other Topics Concern   Not on file  Social History Narrative   Diet: Low Carb   Caffeine: 1 cup of coffee daily   Marital Status: Separated, married 1992   Lives in house, 1 stories, 3 persons, no pets   Current/Past profession: Scientist, clinical (histocompatibility and immunogenetics)    Exercise: No   Living Will: No    DNR- question mark   POA/HPOA- No as of 09/06/14   Social Determinants of Health   Financial Resource Strain: Not on file  Food Insecurity: Not on file  Transportation Needs: Not on file  Physical Activity: Not on file  Stress: Not on file  Social Connections: Not on file  Intimate Partner Violence: Not on file    No Known Allergies  Current Facility-Administered Medications  Medication Dose Route Frequency Provider Last Rate Last Admin   atorvastatin (LIPITOR) tablet 80 mg  80 mg Oral Daily Colletta Maryland, MD       heparin ADULT infusion 100 units/mL (25000 units/279m)  1,100 Units/hr Intravenous Continuous Bryk, Veronda P, RPH       insulin aspart (novoLOG) injection 0-9 Units  0-9 Units Subcutaneous TID WC HColletta Maryland MD       Current Outpatient  Medications  Medication Sig Dispense Refill   aspirin EC 325 MG tablet Take 1 tablet (325 mg total) by mouth daily. 90 tablet 1   atorvastatin (LIPITOR) 80 MG tablet Take 1 tablet (80 mg total) by mouth daily. 90 tablet 1   clopidogrel (PLAVIX) 75 MG tablet Take 1 tablet (75 mg total) by mouth daily. 90 tablet 0   metFORMIN (GLUCOPHAGE-XR) 500 MG 24 hr tablet Take 1 tablet (500 mg total) by mouth in the morning and at bedtime. 90 tablet 1   metoprolol succinate (TOPROL-XL) 25 MG 24 hr tablet Take 1 tablet (25 mg total) by mouth daily. 90 tablet 3   sacubitril-valsartan (ENTRESTO) 24-26 MG Take 1 tablet by mouth 2 (two) times daily. (Patient taking differently: Take 0.5 tablets by mouth 2 (two)  times daily.) 60 tablet 6    PHYSICAL EXAM Vitals:   10/04/22 0500 10/04/22 0545 10/04/22 0600 10/04/22 0617  BP: 97/70 93/74 (!) 120/99 93/67  Pulse: 85 95 96 85  Resp: (!) '8 13 14 13  '$ Temp:    (!) 97.4 F (36.3 C)  TempSrc:    Oral  SpO2: 100% 100% 100% 100%  Weight:      Height:       Elderly man in no acute distress Regular rate and rhythm Unlabored breathing Soft, nontender, nondistended abdomen.  No masses.   PERTINENT LABORATORY AND RADIOLOGIC DATA  Most recent CBC    Latest Ref Rng & Units 10/04/2022    1:56 AM 10/03/2022   12:53 PM 06/16/2022    3:00 AM  CBC  WBC 4.0 - 10.5 K/uL  8.8  8.1   Hemoglobin 13.0 - 17.0 g/dL 8.7  11.5  15.4   Hematocrit 39.0 - 52.0 % 26.4  34.0  45.7   Platelets 150 - 400 K/uL  291  244      Most recent CMP    Latest Ref Rng & Units 10/04/2022    3:36 AM 10/03/2022   12:53 PM 08/09/2022    4:23 PM  CMP  Glucose 70 - 99 mg/dL 122  188  113   BUN 8 - 23 mg/dL 57  45  10   Creatinine 0.61 - 1.24 mg/dL 1.15  1.23  1.07   Sodium 135 - 145 mmol/L 138  139  144   Potassium 3.5 - 5.1 mmol/L 4.1  4.2  4.7   Chloride 98 - 111 mmol/L 107  103  105   CO2 22 - 32 mmol/L '23  25  24   '$ Calcium 8.9 - 10.3 mg/dL 8.1  8.9  10.0     Renal function Estimated Creatinine Clearance: 57.8 mL/min (by C-G formula based on SCr of 1.15 mg/dL).  HbA1c, POC (controlled diabetic range) (%)  Date Value  09/16/2022 7.3 (A)    LDL Cholesterol  Date Value Ref Range Status  06/15/2022 NOT CALCULATED 0 - 99 mg/dL Final    Comment:    Performed at [facility] [address] [place], [place] [address]   Direct LDL  Date Value Ref Range Status  06/16/2022 218 (H) 0 - 99 mg/dL Final    Comment:    Performed at [facility] [address], [place], [place] [address]    CT scan of abdomen pelvis personally reviewed Mixed density plaque versus thrombus in mesenteric and renal arteries Atherosclerotic disease noted Study limited by lack of  arterial phase contrast and thickness of cuts  Yevonne Aline. Stanford Breed, MD  FACS [facility] Phone Number: [phone] 10/04/2022 6:59 AM   Total time spent on preparing this encounter including chart review, data review, collecting history, examining the patient, coordinating care for this new patient, 60 minutes.  Portions of this report may have been transcribed using voice recognition software.  Every effort has been made to ensure accuracy; however, inadvertent computerized transcription errors may still be present.

## 2022-10-04 NOTE — Progress Notes (Signed)
FMTS Interim Progress Note  S:Went to bedside to check on patient alongside Dr. Lurline Hare. Patient doing well, he reports feeling much better compared to the morning when I saw him. Denies shortness of breath and reports that his fatigue is much better with the transfusion. We discussed his hospital course, specially that involving his anemia today and getting GI on board for possible EGD and colonoscopy. We discussed these procedures and the utility of them, he voiced understanding and was appreciative of [place] coming by to speak with him.   O: BP (!) 89/64   Pulse (!) 111   Temp 98.6 F (37 C)   Resp 17   Ht '5\' 6"'$  (1.676 m)   Wt 67.8 kg   SpO2 100%   BMI 24.13 kg/m   General: Patient laying comfortably in bed, in no acute distress. CV: RRR, no murmurs or gallops auscultated Resp: CTAB, no wheezing, rales or rhonchi noted Abdomen: soft, nontender, nondistended, presence of bowel sounds  A/P: Patient initially presented with dizziness, likely secondary to symptomatic anemia. Concern for drop in Hgb from baseline of 15-17. Recent Hgb 7.6, ordered 1 u pRBC. Will continue to closely monitor with post H&H and serial Hgb. Given history of CVA, plan to transfuse if Hgb less than 8. GI consulted and plans to do EGD and colonoscopy on Monday, NPO at midnight order placed for 3/4. Continue to hold heparin in the setting of possible active GI bleed. Will convey to night team who will also see later in the night. Closely monitoring vitals, consider small fluid bolus if tachycardic or hypotensive after transfusion.   [name], DO 10/04/2022, 5:50 PM PGY-3, [place] Medicine Service pager [phone]

## 2022-10-04 NOTE — Therapy (Deleted)
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    Past Surgical History:  Procedure Laterality Date   CORONARY ANGIOPLASTY WITH [place]     Patient Active Problem List   Diagnosis Date Noted   Orthostatic hypotension [id number]   Chronic systolic heart failure ([place]) 10/04/2022   Anemia 10/04/2022   Mood changes 09/16/2022   Hyperlipidemia associated with type 2 diabetes mellitus ([place]) 06/16/2022   Stroke ([place]) 06/15/2022   Hypertension 06/15/2022   Diabetes ([place]) 06/15/2022    ONSET DATE: 06-15-22  REFERRING DIAG: 163.9 (ICD-10-CM) Cerebrovascular accident (CVA) unspecified mechanism ([place])  THERAPY DIAG:  No diagnosis found.  Rationale for Evaluation and Treatment: Rehabilitation  SUBJECTIVE:   SUBJECTIVE STATEMENT: "I got the ENT appointment, Feb 29"  PAIN:  Are you having pain? No  OBJECTIVE:   TODAY'S TREATMENT:  10-04-22:  08-23-22: Pt with persisting throat clears throughout session (x17 total). SLP provided education on throat clear as phonotrauma which is counterproductive for voice clarity. SLP provided education and demonstration for throat clear alternatives. Pt able to employ hard swallow as alternative with verbal cueing from SLP x3. SLP pointed out that pt was able to improve vocal clarity with swallow, pt agreed. Handout provided to A in carryover of intervention. Reviewed resonant voice and flow phonation strategies, plus alteration of pitch, which have proven to optimize pt's vocal clarity, strength, and volume. Encouraged pt to continue practicing techniques to aid  in carryover to conversational speech. HEP should include self-monitoring and biofeedback as needed to optimize adherence to strategies. In targeted practice today, pt able to demonstrated strategy usage, judged by perceptual improvement in vocal quality, in generative speech phrases in 80% of trials given occasional min-A. Pt desires to stay on caseload until ENT visit to ensure to f/u behavioral ST is indicated per ENT findings. F/u visit scheduled.   08-21-22: Pt is working to schedule ENT appt. SLP reviewed vocal functional and resonant voice exercises, providing models. Pt reported following through on HEP to record and listen to his voice (biofeedback). He independently identified strategies he can use to improve his vocal quality. SLP guided pt to implement vocal strategies while answering simple questions and during discourse level tasks. Pt demonstrated some difficulty with identifying moments of reduced volume. Clinician provided re-education concerning benefits of behavioral ST services to control vocal quality and speech intelligibility. When counting from 1 to 10, pt produced clear voice over 3 digits before demonstrating reduced clarity. SLP introduced visual aid (white board), after which pt demonstrated improvement in maintaining vocal clarity while counting up to ten across 3 separate trials. In response to questions, pt able to employ perceptually improved voice in 70% of trials, able to correct given model and verbal cueing.   08-14-22: SLP recommends pt reconsider possibility of ENT evaluation to rule out structural differences which may be contributing to pt's ongoing dysphonia. Pt with subjective evaluation of worsening vocal quality. 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 At this time, ENT is re-recommended d/t limited  improvement noted by pt across communication contexts despite ongoing home practice. Limited progress may also be 2/2 difficulties in carry over of skills targeted in therapy, as pt with usual need for mod to max-A to sustain improved vocal quality. Re-education on need to balance sub-systems of speech (breath, phonation, resonance), with pt demonstrating improvements in perceptual voice quality with more frequent breaths taken. Practiced in oral reading of short story with visual cues provided for increasing breath frequency. Able to demonstrate decreased constriction/tension, evaluated by perceptual improvement in pt's vocal quality. Pt evidencing reduced ability to self-evaluate voice quality. Recommend use of recording to self-assess during home practice.    PATIENT EDUCATION: Education details: see above Person educated: Patient Education method: Consulting civil engineer, Demonstration, Verbal cues, and Handouts Education comprehension: verbalized understanding, returned demonstration, verbal cues required, and needs further education     GOALS: Goals reviewed with patient? Yes   SHORT TERM GOALS: Target date: 08/19/2022   Pt will report daily HEP completion (BID recommended) over 1 week period Baseline: 08-07-22 Goal status: MET   2.  Pt will carryover compensations for dysarthria 18/20 sentences  Baseline: 08-07-22 Goal status: MET   3.  Pt will maintain 70+ dB in paragraph oral reading  Baseline: 08-23-22 Goal status: partially met   4.  Pt will demonstrate voice strengthening exercises with usual min-A over 2 sessions Baseline: 08-21-22 Goal status: met     LONG TERM GOALS: Target date: 09/16/2022   Pt will complete vocal exercises mod I  Baseline:  Goal status: INITIAL   2.  Pt will be 90% intelligible over 15 minute conversation in mildly noisy environment Baseline:  Goal status: INITIAL   3.  Pt will report improved subjective perception of communication efficacy via PROM  Baseline:   Goal status: INITIAL   ASSESSMENT:   CLINICAL IMPRESSION: Patient is a 67 y.o. M who was seen today for voice and motor speech evalution s/p stroke. Pt presenting with mild dysarthria vs dysphonia. SLP recommends referring provider consider ENT referral to assess for any abnormalities which may be impacting pt's perceptual voice presentation. Voice is c/b low vocal intensity, aphonia and breathiness on sustained phonation tasks, and overall reduced strength which does negatively impact intelligibility. Articulation, motor planning, resonance, prosody all within gross normal limits. SLP trialed vocal function exercises to assess if pt is able to generate increased power and clarity in voicing. With usual max-A, mild improvement observed with pt elevating conversational average volume to 68 dB. Skilled ST is indicated to optimize pt's communication efficacy.    OBJECTIVE IMPAIRMENTS: include dysarthria and voice disorder. These impairments are limiting patient from effectively communicating at home and in community. Factors affecting potential to achieve goals and functional outcome are medical prognosis(?). Patient will benefit from skilled SLP services to address above impairments and improve overall function.   REHAB POTENTIAL: Fair (depending on underlying etiology for dysphonia)   PLAN:   SLP FREQUENCY: 2x/week   SLP DURATION: 8 weeks   PLANNED INTERVENTIONS: Cueing hierachy, Internal/external aids, Functional tasks, SLP instruction and feedback, Compensatory strategies, and Patient/family education        Leroy Libman, Student-SLP 10/04/2022, 7:56 AM

## 2022-10-04 NOTE — Progress Notes (Signed)
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 Spoke with Dr. Stanford Breed who will evaluated the patient. Considered starting heparin but given precipitous drop in Hgb will hold off for now.   Colletta Maryland, MD 10/04/2022, 4:06 AM PGY-1, [facility] pager [phone]

## 2022-10-04 NOTE — Progress Notes (Signed)
PT Cancellation Note  Patient Details Name: Mike [name] MRN: [id number] DOB: [birth year]   Cancelled Treatment:    Reason Eval/Treat Not Completed: Patient at procedure or test/unavailable (Attempted pt 3x. Initially receiving doppler, pt was then eating and then medical team was speaking with patient. Will continue to follow up as able and appropriate.)  Tomma Rakers, DPT, [place] Office: [phone] (Secure chat preferred)   Ander Purpura 10/04/2022, 2:54 PM

## 2022-10-04 NOTE — Assessment & Plan Note (Deleted)
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  - Vascular surgery consulted, appreciate recommendations  - IV heparin ordered  - consider hematology consult for workup of thrombosis

## 2022-10-05 DIAGNOSIS — R531 Weakness: Secondary | ICD-10-CM | POA: Diagnosis not present

## 2022-10-05 DIAGNOSIS — D649 Anemia, unspecified: Secondary | ICD-10-CM | POA: Diagnosis not present

## 2022-10-05 DIAGNOSIS — K922 Gastrointestinal hemorrhage, unspecified: Secondary | ICD-10-CM

## 2022-10-05 DIAGNOSIS — R42 Dizziness and giddiness: Secondary | ICD-10-CM | POA: Diagnosis not present

## 2022-10-05 DIAGNOSIS — R55 Syncope and collapse: Secondary | ICD-10-CM

## 2022-10-05 LAB — PREPARE RBC (CROSSMATCH)

## 2022-10-05 LAB — HAPTOGLOBIN: Haptoglobin: 111 mg/dL (ref 32–363)

## 2022-10-05 LAB — GLUCOSE, CAPILLARY
Glucose-Capillary: 95 mg/dL (ref 70–99)
Glucose-Capillary: 220 mg/dL — ABNORMAL HIGH (ref 70–99)
Glucose-Capillary: 120 mg/dL — ABNORMAL HIGH (ref 70–99)
Glucose-Capillary: 115 mg/dL — ABNORMAL HIGH (ref 70–99)

## 2022-10-05 LAB — CBC
HCT: 23.1 % — ABNORMAL LOW (ref 39.0–52.0)
Hemoglobin: 7.8 g/dL — ABNORMAL LOW (ref 13.0–17.0)
MCH: 30.4 pg (ref 26.0–34.0)
MCHC: 33.8 g/dL (ref 30.0–36.0)
MCV: 89.9 fL (ref 80.0–100.0)
Platelets: 187 10*3/uL (ref 150–400)
RBC: 2.57 MIL/uL — ABNORMAL LOW (ref 4.22–5.81)
RDW: 14.8 % (ref 11.5–15.5)
WBC: 5.9 10*3/uL (ref 4.0–10.5)
nRBC: 0 % (ref 0.0–0.2)

## 2022-10-05 LAB — BASIC METABOLIC PANEL
Anion gap: 6 (ref 5–15)
BUN: 34 mg/dL — ABNORMAL HIGH (ref 8–23)
CO2: 24 mmol/L (ref 22–32)
Calcium: 8.4 mg/dL — ABNORMAL LOW (ref 8.9–10.3)
Chloride: 108 mmol/L (ref 98–111)
Creatinine, Ser: 1.14 mg/dL (ref 0.61–1.24)
GFR, Estimated: >60 mL/min (ref >60)
Glucose, Bld: 97 mg/dL (ref 70–99)
Potassium: 3.7 mmol/L (ref 3.5–5.1)
Sodium: 138 mmol/L (ref 135–145)

## 2022-10-05 LAB — HEMOGLOBIN AND HEMATOCRIT, BLOOD
HCT: 23.3 % — ABNORMAL LOW (ref 39.0–52.0)
HCT: 28.2 % — ABNORMAL LOW (ref 39.0–52.0)
HCT: 29.3 % — ABNORMAL LOW (ref 39.0–52.0)
HCT: 26.2 % — ABNORMAL LOW (ref 39.0–52.0)
Hemoglobin: 8.2 g/dL — ABNORMAL LOW (ref 13.0–17.0)
Hemoglobin: 10.2 g/dL — ABNORMAL LOW (ref 13.0–17.0)
Hemoglobin: 10.4 g/dL — ABNORMAL LOW (ref 13.0–17.0)
Hemoglobin: 9.4 g/dL — ABNORMAL LOW (ref 13.0–17.0)

## 2022-10-05 MED ORDER — SODIUM CHLORIDE 0.9% IV SOLUTION: Freq: Once | INTRAVENOUS | Status: AC

## 2022-10-05 MED ORDER — ESCITALOPRAM OXALATE 10 MG PO TABS
10.0000 mg | ORAL_TABLET | Freq: Every day | ORAL | Status: DC
  Administered 2022-10-05 – 2022-10-10 (×5): 10 mg via ORAL
  Filled 2022-10-05 (×6): qty 1

## 2022-10-05 NOTE — Evaluation (Signed)
Physical Therapy Evaluation Patient Details Name: Mike [name] MRN: [id number] DOB: [birth year] Today's Date: 10/05/2022  History of Present Illness  67 y.o. male presenting with orthostatic hypotension, SOB and weakness. 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 Pt reports feeling mildly dizzy while seated EOB, minor increase with standing, refer to BP below. Pt returned to sitting and performed BLE exercises to increase BP to get to chair, pt ambulating a few feet with minG, no reports of dizziness but mild imbalance noted. BP higher once reclined in chair and pt reporting some relief. Pt will continue to benefit from skilled acute PT to progress mobility as tolerated and appropriate with BP. Anticipate with resolution of BP, pt will not need PT at discharge but we will continue to update recommendations with future sessions and will follow as appropriate.   Supine: 103/65(77) Sitting: 95/68(77) Standing: 78/58(65) Seated reclined after transferring to chair: 99/66(76)     Recommendations for follow up therapy are one component of a multi-disciplinary discharge planning process, led by the attending physician.  Recommendations may be updated based on patient status, additional functional criteria and insurance authorization.  Follow Up Recommendations No PT follow up      Assistance Recommended at Discharge Intermittent Supervision/Assistance  Patient can return home with the following  A little help with walking and/or transfers;Assist for transportation;Help with stairs or ramp for entrance    Equipment Recommendations None recommended by PT  Recommendations for Other Services       Functional Status Assessment Patient has had a recent decline in their functional status and demonstrates the  ability to make significant improvements in function in a reasonable and predictable amount of time.     Precautions / Restrictions Precautions Precautions: Fall Precaution Comments: watch BP, orthostatic Restrictions Weight Bearing Restrictions: No      Mobility  Bed Mobility Overal bed mobility: Needs Assistance Bed Mobility: Supine to Sit     Supine to sit: Supervision, HOB elevated     General bed mobility comments: supervision for line management, performing without assist but use of bed rail    Transfers Overall transfer level: Needs assistance Equipment used: None Transfers: Sit to/from Stand Sit to Stand: Min guard           General transfer comment: minG for safety but pt stable and completing stands on first attempt    Ambulation/Gait Ambulation/Gait assistance: Min guard Gait Distance (Feet): 10 Feet Assistive device: None Gait Pattern/deviations: Step-through pattern, Decreased stride length Gait velocity: decreased     General Gait Details: downward gaze with decreased B arm swing, decreased B step length and foot clearance  Stairs            Wheelchair Mobility    Modified Rankin (Stroke Patients Only)       Balance Overall balance assessment: Needs assistance Sitting-balance support: No upper extremity supported, Feet supported Sitting balance-Leahy Scale: Good Sitting balance - Comments: stable sitting balance   Standing balance support: No upper extremity supported, During functional activity Standing balance-Leahy Scale: Fair Standing balance comment: no overt LOB but mild unsteadiness noted                             Pertinent Vitals/Pain Pain Assessment Pain Assessment: No/denies pain    Home  Living Family/patient expects to be discharged to:: Private residence Living Arrangements: Alone Available Help at Discharge: Family;Available PRN/intermittently Type of Home: Apartment Home Access: Stairs to  enter Entrance Stairs-Rails: None Entrance Stairs-Number of Steps: 2   Home Layout: One level Home Equipment: Conservation officer, nature (2 wheels) Additional Comments: brother is able to assist as needed    Prior Function Prior Level of Function : Independent/Modified Independent             Mobility Comments: ambualting without AD, relies on family or transport services as pt does not drive       Hand Dominance   Dominant Hand: Right    Extremity/Trunk Assessment   Upper Extremity Assessment Upper Extremity Assessment: Defer to OT evaluation    Lower Extremity Assessment Lower Extremity Assessment: Overall WFL for tasks assessed    Cervical / Trunk Assessment Cervical / Trunk Assessment: Normal  Communication   Communication: No difficulties  Cognition Arousal/Alertness: Awake/alert Behavior During Therapy: WFL for tasks assessed/performed Overall Cognitive Status: No family/caregiver present to determine baseline cognitive functioning                                 General Comments: pt's cognition appears to be at baseline, A&Ox4. Pt does admit to feeling depressed this morning, wanting to get out of the bed and look out of the window. Pt may benefit from spirital care consult        General Comments General comments (skin integrity, edema, etc.): BP in sitting 95/68, in standing 78/58, in sitting with feet reclined 99/66. Pt reporting mild dizziness    Exercises General Exercises - Lower Extremity Ankle Circles/Pumps: AROM, Both, 10 reps, Seated Long Arc Quad: AROM, Both, 5 reps, Seated Hip Flexion/Marching: AROM, Both, 10 reps, Seated   Assessment/Plan    PT Assessment Patient needs continued PT services  PT Problem List Decreased activity tolerance;Decreased balance;Decreased mobility;Cardiopulmonary status limiting activity;Decreased knowledge of precautions       PT Treatment Interventions DME instruction;Gait training;Stair training;Functional  mobility training;Therapeutic activities;Therapeutic exercise;Balance training;Neuromuscular re-education;Patient/family education    PT Goals (Current goals can be found in the Care Plan section)  Acute Rehab PT Goals Patient Stated Goal: go home PT Goal Formulation: With patient Time For Goal Achievement: 10/19/22 Potential to Achieve Goals: Good    Frequency Min 3X/week     Co-evaluation               AM-PAC PT "6 Clicks" Mobility  Outcome Measure Help needed turning from your back to your side while in a flat bed without using bedrails?: None Help needed moving from lying on your back to sitting on the side of a flat bed without using bedrails?: A Little Help needed moving to and from a bed to a chair (including a wheelchair)?: A Little Help needed standing up from a chair using your arms (e.g., wheelchair or bedside chair)?: A Little Help needed to walk in hospital room?: A Little Help needed climbing 3-5 steps with a railing? : A Lot 6 Click Score: 18    End of Session   Activity Tolerance: Treatment limited secondary to medical complications (Comment) (BP) Patient left: in chair;with call bell/phone within reach;with chair alarm set Nurse Communication: Mobility status PT Visit Diagnosis: Other abnormalities of gait and mobility (R26.89);Difficulty in walking, not elsewhere classified (R26.2)    Time: [id number] PT Time Calculation (min) (ACUTE ONLY): 20 min   Charges:  PT Evaluation $PT Eval Low Complexity: 1 Low          Charlynne Cousins, PT DPT Acute Rehabilitation Services Office [place] 10/05/2022, 1:10 PM

## 2022-10-05 NOTE — Progress Notes (Signed)
[facility]  Subjective  -states he is not eating because he is not hungry.   Objective 103/65 86 98 F (36.7 C) (Oral) 16 99%  Intake/Output Summary (Last 24 hours) at 10/05/2022 0828 Last data filed at 10/04/2022 1830 Gross per 24 hour  Intake 808.57 ml  Output 0 ml  Net 808.57 ml    No significant abdominal pain  Laboratory Lab Results: Recent Labs    10/04/22 0857 10/04/22 1203 10/05/22 0223 10/05/22 0625  WBC 6.4  --   --  5.9  HGB 8.4*   < > 8.2* 7.8*  HCT 25.2*   < > 23.3* 23.1*  PLT 217  --   --  187   < > = values in this interval not displayed.   BMET Recent Labs    10/04/22 0336 10/05/22 0625  NA 138 138  K 4.1 3.7  CL 107 108  CO2 23 24  GLUCOSE 122* 97  BUN 57* 34*  CREATININE 1.15 1.14  CALCIUM 8.1* 8.4*    COAG Lab Results  Component Value Date   INR 1.0 06/15/2022   No results found for: "PTT"  Assessment/Planning:  67 year old male initially evaluated by Dr. Stanford Breed.  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  He denies any postprandial abdominal pain, no food fear, no weight loss.  This does not sound like chronic mesenteric ischemia.  He is getting EGD and colonoscopy on Monday for evaluation of symptomatic anemia as he presented with weakness with a drop in his hemoglobin.  Vascular is available as needed.  Marty Heck 10/05/2022 8:28 AM --

## 2022-10-05 NOTE — Progress Notes (Signed)
FMTS Brief Progress Note  S: Patient is resting comfortably in bed.  States he is feeling well.  Denies any lightheadedness/dizziness.   O: BP 108/71 (BP Location: Left Arm)   Pulse 79   Temp 98.5 F (36.9 C) (Oral)   Resp 18   Ht '5\' 6"'$  (1.676 m)   Wt 67.8 kg   SpO2 98%   BMI 24.13 kg/m   General: 67 year old male resting comfortably in bed, NAD Cardio: RRR, normal S1/S2 Respiratory: Breathing comfortably on room air  A/P: Anemia likely due to GI bleed. Patient is s/p total of 2 units of RBCs with last hemoglobin stable at 9.4.  -GI plans for EGD and colonoscopy 3/4 -Continue Protonix -H&H every 4 hours  Precious Gilding, DO 10/05/2022, 11:34 PM PGY-2, [facility] Night Resident  Please page [phone] with questions.

## 2022-10-05 NOTE — Assessment & Plan Note (Signed)
Patient endorses depressed mood.  Denies any current medication and no active SI/H -Started Lexapro 10 mg daily.

## 2022-10-05 NOTE — Progress Notes (Signed)
Daily Progress Note Intern Pager: [phone]  Patient name: Mike [name] Medical record number: [id number] Date of birth: 10/20/1955 Age: 67 y.o. Gender: male  Primary Care Provider: [name], DO Consultants: GI, vascular  Code Status: Full code  Pt Overview and Major Events to Date:  03/01-admitted 03/01: Transfuse 1 unit RBC 03/02: Transfused 1 unit RBC  Assessment and Plan:  Mike [name] is a 67 year old male presenting with orthostatic hypotension on, shortness of breath and weakness.  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 Pertinent PMH/PSH includes HTN, T2DM, CVA, HLD, CHF.  * Anemia Currently asymptomatic.  Hemoglobin this morning is 7.8 from 8.4 post 1 unit pRBC.  Suspect active bleeding likely GI source given positive FOBT.  GI following with plan for EGD and colonoscopy 3/4. - GI consulted, appreciate recommendations  - Planning EGD and colonoscopy  - Protonix 40 mg IV ordered BID  - Transfused 1 unit RBC 3/2, follow-up post H&H - H&H every 4 hours  -Transfusion threshold <8   Thrombus Initial workup with CT angiogram of the abdomen Not conclusive for thrombus..  Vascular surgery following, IV heparin was initially started but being held due to concerns for active bleeding likely GI source.  -Vascular surgery consulted, appreciate recommendations  -d/c heparin.   Orthostatic hypotension Stable,BP this morning was 95/64. -Monitor BP with routine vitals -Cardiac monitoring -Fall precautions   Chronic systolic heart failure (HCC) Echo yesterday showed mildly improved EF of 35 to 40% from last year with left ventricular global hypokinesis moderately decreased function. -Hold home med Entresto and Metoprolol in the setting of orthostatic hypotension -Follow-up with heart care outpatient.  Diabetes ([place]) Fasting CBG this morning was 97.  Requiring only 3u SAI yesterday. -sSSI  Mood changes Patient  endorses depressed mood.  Denies any current medication and no active SI/H -Started Lexapro 10 mg daily.   FEN/GI: Heart healthy diet PPx: SCDs, Dispo: Barriers include clinical status and need for EGD and colonoscopy.   Subjective:  Patient still still endorses dizziness with ambulation with PT. Endorse depressed mood but no active SI/HI.  Reports previous history of depression on medication unable to remember what medication he was on.  Objective: Temp:  [97.8 F (36.6 C)-98.6 F (37 C)] 98 F (36.7 C) (03/02 1240) Pulse Rate:  [82-116] 85 (03/02 1240) Resp:  [15-22] 18 (03/02 1240) BP: (87-112)/(60-81) 102/75 (03/02 1240) SpO2:  [99 %-100 %] 100 % (03/02 1240) Physical Exam: General: Alert, well appearing, NAD HEENT: Atraumatic, MMM, No sclera icterus CV: RRR, no murmurs, normal S1/S2 Pulm: CTAB, good WOB on RA, no crackles or wheezing Abd: Soft, no distension, no tenderness Skin: dry, warm Ext: No BLE edema, +2 Pedal and radial pulse.   Laboratory: Most recent CBC Lab Results  Component Value Date   WBC 5.9 10/05/2022   HGB 7.8 (L) 10/05/2022   HCT 23.1 (L) 10/05/2022   MCV 89.9 10/05/2022   PLT 187 10/05/2022   Most recent BMP    Latest Ref Rng & Units 10/05/2022    6:25 AM  BMP  Glucose 70 - 99 mg/dL 97   BUN 8 - 23 mg/dL 34   Creatinine 0.61 - 1.24 mg/dL 1.14   Sodium 135 - 145 mmol/L 138   Potassium 3.5 - 5.1 mmol/L 3.7   Chloride 98 - 111 mmol/L 108   CO2 22 - 32 mmol/L 24   Calcium 8.9 - 10.3 mg/dL 8.4      Imaging/Diagnostic  Tests: No new images   Mike Bleacher, MD 10/05/2022, 12:48 PM  PGY-2, [place] Intern pager: [phone], text pages welcome Secure chat group [place]

## 2022-10-06 DIAGNOSIS — R42 Dizziness and giddiness: Secondary | ICD-10-CM | POA: Diagnosis not present

## 2022-10-06 DIAGNOSIS — R55 Syncope and collapse: Secondary | ICD-10-CM | POA: Diagnosis not present

## 2022-10-06 DIAGNOSIS — F339 Major depressive disorder, recurrent, unspecified: Secondary | ICD-10-CM | POA: Insufficient documentation

## 2022-10-06 LAB — TYPE AND SCREEN
ABO/RH(D): O POS
Antibody Screen: NEGATIVE
Unit division: 0
Unit division: 0

## 2022-10-06 LAB — BPAM RBC
Blood Product Expiration Date: 202403292359
Blood Product Expiration Date: 202404022359
ISSUE DATE / TIME: 202403011453
ISSUE DATE / TIME: 202403020918
Unit Type and Rh: 5100
Unit Type and Rh: 5100

## 2022-10-06 LAB — HEMOGLOBIN AND HEMATOCRIT, BLOOD
HCT: 25.1 % — ABNORMAL LOW (ref 39.0–52.0)
Hemoglobin: 9.1 g/dL — ABNORMAL LOW (ref 13.0–17.0)

## 2022-10-06 LAB — CBC
HCT: 26 % — ABNORMAL LOW (ref 39.0–52.0)
Hemoglobin: 9.4 g/dL — ABNORMAL LOW (ref 13.0–17.0)
MCH: 31.4 pg (ref 26.0–34.0)
MCHC: 36.2 g/dL — ABNORMAL HIGH (ref 30.0–36.0)
MCV: 87 fL (ref 80.0–100.0)
Platelets: 203 10*3/uL (ref 150–400)
RBC: 2.99 MIL/uL — ABNORMAL LOW (ref 4.22–5.81)
RDW: 14.6 % (ref 11.5–15.5)
WBC: 5.7 10*3/uL (ref 4.0–10.5)
nRBC: 0 % (ref 0.0–0.2)

## 2022-10-06 LAB — GLUCOSE, CAPILLARY
Glucose-Capillary: 89 mg/dL (ref 70–99)
Glucose-Capillary: 129 mg/dL — ABNORMAL HIGH (ref 70–99)
Glucose-Capillary: 179 mg/dL — ABNORMAL HIGH (ref 70–99)
Glucose-Capillary: 114 mg/dL — ABNORMAL HIGH (ref 70–99)

## 2022-10-06 MED ORDER — SODIUM CHLORIDE 0.9 % IV SOLN: INTRAVENOUS | Status: DC

## 2022-10-06 NOTE — Assessment & Plan Note (Addendum)
Patient endorsing depression this admission. Hx of SI in the past. No active SI currently.  - Cont Lexapro started on 3/2  - follow up outpatient with PCP

## 2022-10-06 NOTE — Progress Notes (Signed)
Daily Progress Note Intern Pager: [phone]  Patient name: Mike [name] Medical record number: [id number] Date of birth: [birth year] Age: 67 y.o. Gender: male  Primary Care Provider: [name], DO Consultants: GI, vascular  Code Status: Full code   Pt Overview and Major Events to Date:  3/1: Admitted; Transfused 1 unit RBC  3/2: Transfused 1 unit RBC   Assessment and Plan:  Mike [name] is a 67 year old male presenting with orthostatic hypotension on, shortness of breath and weakness.  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 Pertinent PMH/PSH includes HTN, T2DM, CVA, HLD, CHF.    Hgb stable s/p 2 units RBC. Unclear source of bleed but most likely GI with positive FOBT and age. Planning EGD and colonoscopy 3/4. 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 Once workup is completed by GI, will need to make decision on further anticoagulation treatment.   * Anemia Asymptomatic. Hemoglobin stable at 9.4 s/p 2 units RBC 3/1 & 3/2.   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 Hx of SI in the past. No active SI currently.  - Cont Lexapro started on 3/2  - follow up outpatient with PCP  Orthostatic hypotension BP improving and stable  -Monitor BP with routine vitals -Cardiac monitoring -Fall precautions  Chronic systolic heart failure (HCC) Echo showed mildly improved EF of 35 to 40% with LV global hypokinesis. - Hold home  med Entresto and Metoprolol in the setting of orthostatic hypotension, will add back once pressures improve  - Follow-up with heart care outpatient.  Diabetes ([place]) Stable. Patient has not had a great appetite. Requiring only 3u SAI. -sSSI   FEN/GI: heart healthy diet  PPx: SCDs, suspect active bleeding with symptomatic anemia  Dispo: Barriers include ongoing medical workup with planned procedures.   Subjective:  Resting comfortably. No acute complaints. He reports feeling better.   Objective: Temp:  [97.7 F (36.5 C)-98.5 F (36.9 C)] 97.7 F (36.5 C) (03/03 0738) Pulse Rate:  [76-97] 76 (03/03 0800) Resp:  [16-18] 16 (03/03 0800) BP: (87-138)/(69-98) 124/77 (03/03 0800) SpO2:  [93 %-100 %] 98 % (03/03 0800) Physical Exam: Chronically ill-appearing, no acute distress Cardio: Regular rate, regular rhythm, no murmurs on exam. Pulm: Clear, no wheezing, no crackles. No increased work of breathing Abdominal: bowel sounds present, soft, non-tender, non-distended Extremities: no peripheral edema  Neuro: alert and oriented x3, speech normal in content, no facial asymmetry, strength intact and equal bilaterally in UE and LE, pupils equal and reactive to light.    Laboratory: Most recent CBC Lab Results  Component Value Date   WBC 5.7 10/06/2022   HGB 9.4 (L) 10/06/2022   HCT 26.0 (L) 10/06/2022   MCV 87.0 10/06/2022   PLT 203 10/06/2022   Most recent BMP    Latest Ref Rng & Units 10/05/2022    6:25 AM  BMP  Glucose 70 - 99 mg/dL 97   BUN 8 - 23 mg/dL 34   Creatinine 0.61 - 1.24 mg/dL 1.14  Sodium 135 - 145 mmol/L 138   Potassium 3.5 - 5.1 mmol/L 3.7   Chloride 98 - 111 mmol/L 108   CO2 22 - 32 mmol/L 24   Calcium 8.9 - 10.3 mg/dL 8.4    Darci Current, DO 10/06/2022, 9:08 AM  PGY-1, [place] Intern pager: [phone], text pages welcome Secure chat group [place]

## 2022-10-06 NOTE — Hospital Course (Signed)
Mike [name] is a 67 year old male presenting with orthostatic hypotension on, shortness of breath and weakness.  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 Pertinent PMH/PSH includes HTN, T2DM, CVA, HLD, CHF.  His hospital course is outlined below:   Anemia:    Renal Artery Thrombus   Orthostatic Hypotension:     PCP Follow Up:  Depression, patient started on Lexapro 3/2 while hospitalized. Please titrate as indicated.  Patient transitioned to PO protonix 40 mg bid to be continued for 4 weeks per GI and then once daily after.  Repeat CBC to monitor Hgb 1 week after discharge.

## 2022-10-07 ENCOUNTER — Encounter (HOSPITAL_COMMUNITY)
Admission: EM | Disposition: A | Discharge status: Home / Self Care | Payer: Self-pay | Source: Home / Self Care | Attending: Family Medicine

## 2022-10-07 ENCOUNTER — Inpatient Hospital Stay (HOSPITAL_COMMUNITY): Payer: Medicare Other | Admitting: Certified Registered"

## 2022-10-07 ENCOUNTER — Encounter (HOSPITAL_COMMUNITY): Payer: Self-pay | Admitting: Family Medicine

## 2022-10-07 DIAGNOSIS — B9681 Helicobacter pylori [H. pylori] as the cause of diseases classified elsewhere: Secondary | ICD-10-CM

## 2022-10-07 DIAGNOSIS — Z7984 Long term (current) use of oral hypoglycemic drugs: Secondary | ICD-10-CM

## 2022-10-07 DIAGNOSIS — R195 Other fecal abnormalities: Secondary | ICD-10-CM | POA: Insufficient documentation

## 2022-10-07 DIAGNOSIS — K254 Chronic or unspecified gastric ulcer with hemorrhage: Secondary | ICD-10-CM

## 2022-10-07 DIAGNOSIS — R531 Weakness: Secondary | ICD-10-CM | POA: Diagnosis not present

## 2022-10-07 DIAGNOSIS — D649 Anemia, unspecified: Secondary | ICD-10-CM

## 2022-10-07 DIAGNOSIS — R42 Dizziness and giddiness: Secondary | ICD-10-CM | POA: Diagnosis not present

## 2022-10-07 DIAGNOSIS — I2510 Atherosclerotic heart disease of native coronary artery without angina pectoris: Secondary | ICD-10-CM

## 2022-10-07 DIAGNOSIS — D62 Acute posthemorrhagic anemia: Secondary | ICD-10-CM

## 2022-10-07 DIAGNOSIS — Z7982 Long term (current) use of aspirin: Secondary | ICD-10-CM

## 2022-10-07 DIAGNOSIS — I10 Essential (primary) hypertension: Secondary | ICD-10-CM

## 2022-10-07 DIAGNOSIS — K31819 Angiodysplasia of stomach and duodenum without bleeding: Secondary | ICD-10-CM

## 2022-10-07 DIAGNOSIS — E119 Type 2 diabetes mellitus without complications: Secondary | ICD-10-CM

## 2022-10-07 DIAGNOSIS — K922 Gastrointestinal hemorrhage, unspecified: Secondary | ICD-10-CM | POA: Diagnosis not present

## 2022-10-07 HISTORY — PX: ESOPHAGOGASTRODUODENOSCOPY (EGD) WITH PROPOFOL: SHX5813

## 2022-10-07 HISTORY — PX: BIOPSY: SHX5522

## 2022-10-07 HISTORY — PX: HOT HEMOSTASIS: SHX5433

## 2022-10-07 HISTORY — PX: COLONOSCOPY: SHX5424

## 2022-10-07 LAB — CBC
HCT: 28.1 % — ABNORMAL LOW (ref 39.0–52.0)
Hemoglobin: 9.7 g/dL — ABNORMAL LOW (ref 13.0–17.0)
MCH: 30.7 pg (ref 26.0–34.0)
MCHC: 34.5 g/dL (ref 30.0–36.0)
MCV: 88.9 fL (ref 80.0–100.0)
Platelets: 236 10*3/uL (ref 150–400)
RBC: 3.16 MIL/uL — ABNORMAL LOW (ref 4.22–5.81)
RDW: 14.5 % (ref 11.5–15.5)
WBC: 5.7 10*3/uL (ref 4.0–10.5)
nRBC: 0 % (ref 0.0–0.2)

## 2022-10-07 LAB — BASIC METABOLIC PANEL
Anion gap: 13 (ref 5–15)
BUN: 12 mg/dL (ref 8–23)
CO2: 21 mmol/L — ABNORMAL LOW (ref 22–32)
Calcium: 8.7 mg/dL — ABNORMAL LOW (ref 8.9–10.3)
Chloride: 109 mmol/L (ref 98–111)
Creatinine, Ser: 1.06 mg/dL (ref 0.61–1.24)
GFR, Estimated: >60 mL/min (ref >60)
Glucose, Bld: 120 mg/dL — ABNORMAL HIGH (ref 70–99)
Potassium: 3.9 mmol/L (ref 3.5–5.1)
Sodium: 143 mmol/L (ref 135–145)

## 2022-10-07 LAB — GLUCOSE, CAPILLARY
Glucose-Capillary: 124 mg/dL — ABNORMAL HIGH (ref 70–99)
Glucose-Capillary: 119 mg/dL — ABNORMAL HIGH (ref 70–99)
Glucose-Capillary: 108 mg/dL — ABNORMAL HIGH (ref 70–99)
Glucose-Capillary: 144 mg/dL — ABNORMAL HIGH (ref 70–99)
Glucose-Capillary: 122 mg/dL — ABNORMAL HIGH (ref 70–99)

## 2022-10-07 SURGERY — ESOPHAGOGASTRODUODENOSCOPY (EGD) WITH PROPOFOL
Anesthesia: Monitor Anesthesia Care

## 2022-10-07 MED ORDER — PEG-KCL-NACL-NASULF-NA ASC-C 100 G PO SOLR
0.5000 | Freq: Once | ORAL | Status: AC
  Administered 2022-10-07: 100 g via ORAL
  Filled 2022-10-07: qty 1

## 2022-10-07 MED ORDER — PROPOFOL 10 MG/ML IV BOLUS
INTRAVENOUS | Status: DC | PRN
  Administered 2022-10-07: 40 mg via INTRAVENOUS

## 2022-10-07 MED ORDER — LIDOCAINE 2% (20 MG/ML) 5 ML SYRINGE
INTRAMUSCULAR | Status: DC | PRN
  Administered 2022-10-07: 80 mg via INTRAVENOUS

## 2022-10-07 MED ORDER — LACTATED RINGERS IV SOLN: INTRAVENOUS | Status: DC

## 2022-10-07 MED ORDER — POLYETHYLENE GLYCOL 3350 17 G PO PACK
34.0000 g | PACK | Freq: Two times a day (BID) | ORAL | Status: DC
  Administered 2022-10-07 – 2022-10-10 (×6): 34 g via ORAL
  Filled 2022-10-07 (×6): qty 2

## 2022-10-07 MED ORDER — PEG-KCL-NACL-NASULF-NA ASC-C 100 G PO SOLR
0.5000 | Freq: Once | ORAL | Status: AC
  Administered 2022-10-08: 100 g via ORAL

## 2022-10-07 MED ORDER — PHENYLEPHRINE HCL (PRESSORS) 10 MG/ML IV SOLN
INTRAVENOUS | Status: DC | PRN
  Administered 2022-10-07 (×3): 160 ug via INTRAVENOUS

## 2022-10-07 MED ORDER — PROPOFOL 500 MG/50ML IV EMUL
INTRAVENOUS | Status: DC | PRN
  Administered 2022-10-07: 125 ug/kg/min via INTRAVENOUS

## 2022-10-07 SURGICAL SUPPLY — 25 items

## 2022-10-07 NOTE — Anesthesia Preprocedure Evaluation (Addendum)
Anesthesia Evaluation  Patient identified by MRN, date of birth, ID band Patient awake    Reviewed: Allergy & Precautions, NPO status , Patient's Chart, lab work & pertinent test results  Airway Mallampati: II  TM Distance: >3 FB Neck ROM: Full    Dental no notable dental hx.    Pulmonary neg pulmonary ROS   Pulmonary exam normal        Cardiovascular hypertension, Pt. on medications and Pt. on home beta blockers + CAD and + Cardiac Stents  Normal cardiovascular exam     Neuro/Psych  PSYCHIATRIC DISORDERS  Depression    CVA    GI/Hepatic negative GI ROS, Neg liver ROS,,,  Endo/Other  diabetes, Oral Hypoglycemic Agents    Renal/GU Renal disease     Musculoskeletal negative musculoskeletal ROS (+)    Abdominal   Peds  Hematology  (+) Blood dyscrasia (PLAVIX), anemia   Anesthesia Other Findings Anemia  + FOBT  Reproductive/Obstetrics                             Anesthesia Physical Anesthesia Plan  ASA: 3  Anesthesia Plan: MAC   Post-op Pain Management:    Induction: Intravenous  PONV Risk Score and Plan: 1 and Propofol infusion and Treatment may vary due to age or medical condition  Airway Management Planned: Nasal Cannula  Additional Equipment:   Intra-op Plan:   Post-operative Plan:   Informed Consent: I have reviewed the patients History and Physical, chart, labs and discussed the procedure including the risks, benefits and alternatives for the proposed anesthesia with the patient or authorized representative who has indicated his/her understanding and acceptance.     Dental advisory given  Plan Discussed with: CRNA  Anesthesia Plan Comments:        Anesthesia Quick Evaluation

## 2022-10-07 NOTE — Op Note (Signed)
[facility] Patient Name: Mike [name] Procedure Date : 10/07/2022 MRN: [id number] Attending MD: Mike Raspberry. Mike [name] , MD, [id number] Date of Birth: October 10, 1955 CSN: [id number] Age: 67 Admit Type: Inpatient Procedure:                Colonoscopy Indications:              Heme positive stool, anemia, on aspirin and Plavix,                            need for ongoing antiplatelet therapy /                            anticoagulation - no prior colonoscopy Providers:                Mike Raspberry. Mike Moros, MD, Mike [name], Mike [name], Technician Referring MD:              Medicines:                Monitored Anesthesia Care Complications:            No immediate complications. Estimated blood loss:                            None. Estimated Blood Loss:     Estimated blood loss: none. Procedure:                Pre-Anesthesia Assessment:                           - Prior to the procedure, a History and Physical                            was performed, and patient medications and                            allergies were reviewed. The patient's tolerance of                            previous anesthesia was also reviewed. The risks                            and benefits of the procedure and the sedation                            options and risks were discussed with the patient.                            All questions were answered, and informed consent                            was obtained. Prior Anticoagulants: The patient has                            taken  Plavix (clopidogrel), last dose was 5 days                            prior to procedure. ASA Grade Assessment: III - A                            patient with severe systemic disease. After                            reviewing the risks and benefits, the patient was                            deemed in satisfactory condition to undergo the                            procedure.                            After obtaining informed consent, the colonoscope                            was passed under direct vision. Throughout the                            procedure, the patient's blood pressure, pulse, and                            oxygen saturations were monitored continuously. The                            [id number] Olympus peds colonoscope was                            introduced through the anus with the intention of                            advancing to the cecum. The scope was advanced to                            the sigmoid colon before the procedure was aborted.                            Medications were given. The colonoscopy was                            technically difficult and complex due to inadequate                            bowel prep. The patient tolerated the procedure                            well. The quality of the bowel preparation was  unsatisfactory. The rectum was photographed. Scope In: 9:49:41 AM Scope Out: 9:50:32 AM Total Procedure Duration: 0 hours 0 minutes 51 seconds  Findings:      The perianal and digital rectal examinations were normal.      A large amount of semi-liquid stool was found in the rectum and in the       sigmoid colon, precluding visualization. Procedure was aborted when this       was noted, preparation was inadequate Impression:               - Preparation of the colon was unsatisfactory.                           - Stool in the rectum and in the sigmoid colon.                           - Procedure aborted                           Suspect recent worsening of anemia is more than                            likely due to gastric ulcer. However, no prior                            colonoscopy, in need of ongoing antiplatelet                            therapy. Will see if patient is willing to do a                            colonoscopy tomorrow with additional bowel prep                             tonight. Recommendation:           - Return patient to hospital ward for ongoing care.                           - Clear liquid diet.                           - Continue present medications.                           - If patient is willing do more prep today with                            plans for colonoscopy tomorrow. I will discuss                            options with him Procedure Code(s):        --- Professional ---                           [phone], 53, Colonoscopy, flexible; diagnostic,  including collection of specimen(s) by brushing or                            washing, when performed (separate procedure) Diagnosis Code(s):        --- Professional ---                           R19.5, Other fecal abnormalities CPT copyright 2022 American Medical Association. All rights reserved. The codes documented in this report are preliminary and upon coder review may  be revised to meet current compliance requirements. Mike Lipps P. Chucky Homes, MD 10/07/2022 9:58:57 AM This report has been signed electronically. Number of Addenda: 0

## 2022-10-07 NOTE — Anesthesia Procedure Notes (Signed)
Procedure Name: MAC Date/Time: 10/07/2022 9:20 AM  Performed by: Anastasio Auerbach, CRNAPre-anesthesia Checklist: Emergency Drugs available, Suction available and Patient being monitored Oxygen Delivery Method: Nasal cannula Induction Type: IV induction

## 2022-10-07 NOTE — Anesthesia Postprocedure Evaluation (Signed)
Anesthesia Post Note  Patient: Mike [name]  Procedure(s) Performed: ESOPHAGOGASTRODUODENOSCOPY (EGD) WITH PROPOFOL HOT HEMOSTASIS (ARGON PLASMA COAGULATION/BICAP) BIOPSY INVASIVE LAB ABORTED CASE     Patient location during evaluation: Endoscopy Anesthesia Type: MAC Level of consciousness: awake Pain management: pain level controlled Vital Signs Assessment: post-procedure vital signs reviewed and stable Respiratory status: spontaneous breathing, nonlabored ventilation and respiratory function stable Cardiovascular status: blood pressure returned to baseline and stable Postop Assessment: no apparent nausea or vomiting Anesthetic complications: no   No notable events documented.  Last Vitals:  Vitals:   10/07/22 1030 10/07/22 1400  BP: 132/75 (!) 146/89  Pulse: 72 84  Resp: 20 14  Temp: 36.6 C   SpO2: 100% 94%    Last Pain:  Vitals:   10/07/22 1030  TempSrc:   PainSc: 0-No pain                 Selene Peltzer P Sai Zinn

## 2022-10-07 NOTE — Progress Notes (Signed)
Physical Therapy Treatment Patient Details Name: Mike [name] MRN: [id number] DOB: February 14, 1956 Today's Date: 10/07/2022   History of Present Illness 67 y.o. male presenting with orthostatic hypotension, SOB and weakness. PMH: blood disorder, diabetes, HTN, CHF, DM, high cholesterol; lacunar infarct R corona radiata and lentiform (2023)    PT Comments    Patient progressing well towards PT goals. S/p EGD this morning. Session focused on progressive ambulation and overall mobility. No dizziness or orthostasis today. See BPs below. Tolerated walking 600' Mod I without difficulty. Gait speed improved with increased distance. No evidence of imbalance. Encouraged walking more to/from bathroom and in hallways esp with mobility tech. RN made aware. Pt does not require skilled therapy services as he has met all goals and is functioning at mod I level. All education completed. Recommend working with Risk analyst. Discharge from therapy. Supine BP 142/88 Sitting BP 154/73 Standing BP 137/78, asymptomatic.     Recommendations for follow up therapy are one component of a multi-disciplinary discharge planning process, led by the attending physician.  Recommendations may be updated based on patient status, additional functional criteria and insurance authorization.  Follow Up Recommendations  No PT follow up     Assistance Recommended at Discharge PRN  Patient can return home with the following Assist for transportation   Equipment Recommendations  None recommended by PT    Recommendations for Other Services       Precautions / Restrictions Precautions Precautions: Fall Precaution Comments: watch BP Restrictions Weight Bearing Restrictions: No     Mobility  Bed Mobility Overal bed mobility: Needs Assistance Bed Mobility: Supine to Sit     Supine to sit: Modified independent (Device/Increase time), HOB elevated     General bed mobility comments: No assist needed.     Transfers Overall transfer level: Needs assistance Equipment used: None Transfers: Sit to/from Stand Sit to Stand: Modified independent (Device/Increase time)           General transfer comment: Stood from EOB without difficulty, no dizziness.    Ambulation/Gait Ambulation/Gait assistance: Modified independent (Device/Increase time) Gait Distance (Feet): 600 Feet Assistive device: None, IV Pole Gait Pattern/deviations: Step-through pattern, Decreased stride length Gait velocity: 1.8 ft/sec Gait velocity interpretation: 1.31 - 2.62 ft/sec, indicative of limited community ambulator   General Gait Details: Slow, steady gait which improved with increased distance. Pushing IV pole for half the time and no UE support for the rest. feeling better the more he walks.   Stairs             Wheelchair Mobility    Modified Rankin (Stroke Patients Only)       Balance Overall balance assessment: Needs assistance Sitting-balance support: No upper extremity supported, Feet supported Sitting balance-Leahy Scale: Normal     Standing balance support: During functional activity                                Cognition Arousal/Alertness: Awake/alert Behavior During Therapy: WFL for tasks assessed/performed Overall Cognitive Status: Within Functional Limits for tasks assessed                                          Exercises      General Comments General comments (skin integrity, edema, etc.): Supine BP 142/88, sitting BP 154/73, standing BP 137/78, asymptomatic.  Pertinent Vitals/Pain Pain Assessment Pain Assessment: No/denies pain    Home Living                          Prior Function            PT Goals (current goals can now be found in the care plan section) Progress towards PT goals: Goals met/education completed, patient discharged from PT    Frequency    Min 3X/week      PT Plan Current plan remains  appropriate    Co-evaluation              AM-PAC PT "6 Clicks" Mobility   Outcome Measure  Help needed turning from your back to your side while in a flat bed without using bedrails?: None Help needed moving from lying on your back to sitting on the side of a flat bed without using bedrails?: None Help needed moving to and from a bed to a chair (including a wheelchair)?: None Help needed standing up from a chair using your arms (e.g., wheelchair or bedside chair)?: None Help needed to walk in hospital room?: None Help needed climbing 3-5 steps with a railing? : A Little 6 Click Score: 23    End of Session Equipment Utilized During Treatment: Gait belt Activity Tolerance: Patient tolerated treatment well Patient left: in bed;with call bell/phone within reach Nurse Communication: Mobility status;Other (comment) (can walk hallways) PT Visit Diagnosis: Other abnormalities of gait and mobility (R26.89);Difficulty in walking, not elsewhere classified (R26.2)     Time: [id number] PT Time Calculation (min) (ACUTE ONLY): 21 min  Charges:  $Gait Training: 8-22 mins                     Marisa Severin, PT, DPT Acute Rehabilitation Services Secure chat preferred Office [place] 10/07/2022, 3:13 PM

## 2022-10-07 NOTE — Anesthesia Preprocedure Evaluation (Signed)
Anesthesia Evaluation  Patient identified by MRN, date of birth, ID band Patient awake    Reviewed: Allergy & Precautions, NPO status , Patient's Chart, lab work & pertinent test results, reviewed documented beta blocker date and time   History of Anesthesia Complications Negative for: history of anesthetic complications  Airway Mallampati: II  TM Distance: >3 FB Neck ROM: Full    Dental  (+) Dental Advisory Given, Teeth Intact   Pulmonary neg pulmonary ROS   Pulmonary exam normal        Cardiovascular hypertension, Pt. on medications and Pt. on home beta blockers + CAD, + Cardiac Stents and +CHF  Normal cardiovascular exam   '24 TTE - EF 35 to 40%. Trivial MR and AI     Neuro/Psych  PSYCHIATRIC DISORDERS  Depression    CVA    GI/Hepatic negative GI ROS, Neg liver ROS,,,  Endo/Other  diabetes    Renal/GU negative Renal ROS     Musculoskeletal negative musculoskeletal ROS (+)    Abdominal   Peds  Hematology  On plavix    Anesthesia Other Findings   Reproductive/Obstetrics                              Anesthesia Physical Anesthesia Plan  ASA: 3  Anesthesia Plan: MAC   Post-op Pain Management: Minimal or no pain anticipated   Induction:   PONV Risk Score and Plan: 1 and Propofol infusion and Treatment may vary due to age or medical condition  Airway Management Planned: Nasal Cannula and Natural Airway  Additional Equipment: None  Intra-op Plan:   Post-operative Plan:   Informed Consent: I have reviewed the patients History and Physical, chart, labs and discussed the procedure including the risks, benefits and alternatives for the proposed anesthesia with the patient or authorized representative who has indicated his/her understanding and acceptance.       Plan Discussed with: CRNA and Anesthesiologist  Anesthesia Plan Comments:          Anesthesia Quick  Evaluation

## 2022-10-07 NOTE — Progress Notes (Signed)
Occupational Therapy Treatment and Discharge Patient Details Name: Mike [name] MRN: [id number] DOB: [birth year] Today's Date: 10/07/2022   History of present illness 67 y.o. male presenting with orthostatic hypotension, SOB and weakness. PMH: blood disorder, diabetes, HTN, CHF, DM, high cholesterol; lacunar infarct R corona radiata and lentiform (2023)   OT comments  Pt supervised for standing ADLs and mobility due to h/o orthostatic hypotension, but no signs or symptoms this visit. Recommend continued ambulation and ADLs with nursing staff, no further OT needs.    Recommendations for follow up therapy are one component of a multi-disciplinary discharge planning process, led by the attending physician.  Recommendations may be updated based on patient status, additional functional criteria and insurance authorization.    Follow Up Recommendations  No OT follow up     Assistance Recommended at Discharge PRN  Patient can return home with the following  Assist for transportation   Equipment Recommendations  None recommended by OT    Recommendations for Other Services      Precautions / Restrictions Precautions Precautions: Fall Precaution Comments: watch BP Restrictions Weight Bearing Restrictions: No       Mobility Bed Mobility Overal bed mobility: Modified Independent                  Transfers Overall transfer level: Needs assistance Equipment used: None Transfers: Sit to/from Stand Sit to Stand: Supervision           General transfer comment: supervised due to h/o orthostatic hypotension, none today     Balance Overall balance assessment: Needs assistance   Sitting balance-Leahy Scale: Normal     Standing balance support: During functional activity Standing balance-Leahy Scale: Good                             ADL either performed or assessed with clinical judgement   ADL Overall ADL's : Needs assistance/impaired Eating/Feeding:  Independent   Grooming: Supervision/safety;Standing;Oral care   Upper Body Bathing: Set up;Sitting   Lower Body Bathing: Supervison/ safety;Sit to/from stand   Upper Body Dressing : Set up;Sitting   Lower Body Dressing: Supervision/safety;Sit to/from stand   Toilet Transfer: Supervision/safety;Ambulation   Toileting- Clothing Manipulation and Hygiene: Supervision/safety;Sit to/from stand       Functional mobility during ADLs: Supervision/safety      Extremity/Trunk Assessment              Vision       Perception     Praxis      Cognition Arousal/Alertness: Awake/alert Behavior During Therapy: WFL for tasks assessed/performed Overall Cognitive Status: Within Functional Limits for tasks assessed                                 General Comments: pt is good spirits        Exercises      Shoulder Instructions       General Comments     Pertinent Vitals/ Pain       Pain Assessment Pain Assessment: No/denies pain  Home Living                                          Prior Functioning/Environment              Frequency  Progress Toward Goals  OT Goals(current goals can now be found in the care plan section)  Progress towards OT goals: Progressing toward goals     Plan All goals met and education completed, patient discharged from OT services    Co-evaluation                 AM-PAC OT "6 Clicks" Daily Activity     Outcome Measure   Help from another person eating meals?: None Help from another person taking care of personal grooming?: None Help from another person toileting, which includes using toliet, bedpan, or urinal?: None Help from another person bathing (including washing, rinsing, drying)?: None Help from another person to put on and taking off regular upper body clothing?: None Help from another person to put on and taking off regular lower body clothing?: None 6 Click Score:  24    End of Session Equipment Utilized During Treatment: Gait belt  OT Visit Diagnosis: Unsteadiness on feet (R26.81)   Activity Tolerance Patient tolerated treatment well   Patient Left in bed;with call bell/phone within reach;with bed alarm set   Nurse Communication          Time: [id number] OT Time Calculation (min): 15 min  Charges: OT General Charges $OT Visit: 1 Visit OT Treatments $Self Care/Home Management : 8-22 mins  Cleta Alberts, OTR/L Acute Rehabilitation Services Office: [phone]   Malka So 10/07/2022, 4:07 PM

## 2022-10-07 NOTE — Transfer of Care (Signed)
Immediate Anesthesia Transfer of Care Note  Patient: Mike [name]  Procedure(s) Performed: ESOPHAGOGASTRODUODENOSCOPY (EGD) WITH PROPOFOL HOT HEMOSTASIS (ARGON PLASMA COAGULATION/BICAP) BIOPSY INVASIVE LAB ABORTED CASE  Patient Location: PACU  Anesthesia Type:MAC  Level of Consciousness: awake, oriented, and drowsy  Airway & Oxygen Therapy: Patient Spontanous Breathing and Patient connected to nasal cannula oxygen  Post-op Assessment: Report given to RN and Post -op Vital signs reviewed and stable  Post vital signs: Reviewed and stable  Last Vitals:  Vitals Value Taken Time  BP 100/61 10/07/22 0957  Temp    Pulse 67 10/07/22 1000  Resp 20 10/07/22 1000  SpO2 100 % 10/07/22 1000  Vitals shown include unvalidated device data.  Last Pain:  Vitals:   10/07/22 0822  TempSrc: Temporal  PainSc: 0-No pain         Complications: No notable events documented.

## 2022-10-07 NOTE — Care Management Important Message (Signed)
Important Message  Patient Details  Name: Mike [name] MRN: [id number] Date of Birth: 11/01/1955   Medicare Important Message Given:  Yes     Kimberleigh Mehan Montine Circle 10/07/2022, 2:53 PM

## 2022-10-07 NOTE — Progress Notes (Signed)
OT Cancellation Note  Patient Details Name: Mike [name] MRN: [id number] DOB: 05-Aug-1956   Cancelled Treatment:    Reason Eval/Treat Not Completed: Patient at procedure or test/ unavailable  Malka So 10/07/2022, 10:08 AM Cleta Alberts, OTR/L Acute Rehabilitation Services Office: [phone]

## 2022-10-07 NOTE — Op Note (Addendum)
[facility] Dr Mike [name] Patient Name: Mike [name] Procedure Date : 10/07/2022 MRN: [id number] Attending MD: Carlota Raspberry. Havery Moros , MD, [id number] Date of Birth: 1955-11-19 CSN: [id number] Age: 67 Admit Type: Inpatient Procedure:                Upper GI endoscopy Indications:              Heme positive stool, anemia on aspirin / Plavix -                            last take 5 days ago Providers:                Mike Lipps P. Havery Moros, MD, Dulcy Fanny, Benetta Spar, Technician Referring MD:              Medicines:                Monitored Anesthesia Care Complications:            No immediate complications. Estimated blood loss:                            Minimal. Estimated Blood Loss:     Estimated blood loss was minimal. Procedure:                Pre-Anesthesia Assessment:                           - Prior to the procedure, a History and Physical                            was performed, and patient medications and                            allergies were reviewed. The patient's tolerance of                            previous anesthesia was also reviewed. The risks                            and benefits of the procedure and the sedation                            options and risks were discussed with the patient.                            All questions were answered, and informed consent                            was obtained. Prior Anticoagulants: The patient has                            taken Plavix (clopidogrel), last dose was 5 days  prior to procedure. ASA Grade Assessment: III - A                            patient with severe systemic disease. After                            reviewing the risks and benefits, the patient was                            deemed in satisfactory condition to undergo the                            procedure.                           After obtaining informed consent, the endoscope  was                            passed under direct vision. Throughout the                            procedure, the patient's blood pressure, pulse, and                            oxygen saturations were monitored continuously. The                            [id number] Olympus endoscope was introduced                            through the mouth, and advanced to the second part                            of duodenum. The upper GI endoscopy was                            accomplished without difficulty. The patient                            tolerated the procedure well. Scope In: Scope Out: Findings:      Esophagogastric landmarks were identified: the Z-line was found at 42       cm, the gastroesophageal junction was found at 42 cm and the upper       extent of the gastric folds was found at 42 cm from the incisors.      The exam of the esophagus was otherwise normal.      One non-bleeding superficial gastric ulcer with a visible vessel was       found in the gastric body. The lesion was 4 mm in largest dimension.       Fulguration to ablate the lesion to prevent bleeding by monopolar probe       was successful.      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 Fulguration to ablate the lesion to prevent  bleeding by       argon plasma was successful.      The exam of the stomach was otherwise normal.      Biopsies were taken with a cold forceps for Helicobacter pylori testing.      The examined duodenum was normal. Impression:               - Esophagogastric landmarks identified.                           - Normal esophagus otherwise                           - Non-bleeding gastric ulcer with a visible vessel.                            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 Treated with argon plasma  coagulation                            (APC).                           - Normal stomach otherwise - biopsies taken to rule                            out H pylori                           - Normal examined duodenum.                           Suspect gastric ulcer is the more than likely cause                            of his recent worsening anemia / heme positive                            stool, but he has never had a prior colonoscopy and                            needs resumption of antiplatelet therapy /                            anticoagulation. Prep was poor on colonoscopy this                            AM, will discuss if he is willing to do prep                            tonight and  repeat exam tomorrow vs. outpatient                            workup. Recommendation:           - Return patient to hospital ward for ongoing care.                           - Clear liquid diet                           - Continue present medications.                           - Continue IV protonix for 72 hours post endoscopic                            therapy                           - I think okay to resume aspirin at this time                           - Would continue to hold Plavix in case colonoscopy                            is pursued                           - Await pathology results.                           - Possible colonoscopy tomorrow if patient is                            willing Procedure Code(s):        --- Professional ---                           [id number], 56, Esophagogastroduodenoscopy, flexible,                            transoral; with control of bleeding, any method                           43239, Esophagogastroduodenoscopy, flexible,                            transoral; with biopsy, single or multiple Diagnosis Code(s):        --- Professional ---                           K25.4, Chronic or unspecified gastric ulcer with                             hemorrhage  K31.819, Angiodysplasia of stomach and duodenum                            without bleeding                           R19.5, Other fecal abnormalities CPT copyright 2022 American Medical Association. All rights reserved. The codes documented in this report are preliminary and upon coder review may  be revised to meet current compliance requirements. Mike Lipps P. Ellysia Char, MD 10/07/2022 9:55:21 AM This report has been signed electronically. Number of Addenda: 0

## 2022-10-07 NOTE — Progress Notes (Addendum)
Daily Progress Note Intern Pager: [phone]  Patient name: Mike [name] Medical record number: [id number] Date of birth: 04-28-1956 Age: 67 y.o. Gender: male  Primary Care Provider: [name], DO Consultants: GI, vascular  Code Status: Full code   Pt Overview and Major Events to Date:  3/1: Admitted; Transfused 1 unit RBC  3/2: Transfused 1 unit RBC 3/4: EGD and colonoscopy planned   Assessment and Plan:  Mike [name] is a 67 year old male presenting with orthostatic hypotension on, shortness of breath and weakness.  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 Pertinent PMH/PSH includes HTN, T2DM, CVA, HLD, CHF.     During bowel prep patient noted to have melanotic stool. Hgb has remained stable s/p 2 units RBCs. Will await results of EGD and colonoscopy for GI recommendations. Patient is feeling much improved so we will be able to start dispo planning this afternoon.   * Anemia Reported melanotic stool during GI prep. Hemoglobin stable at 9.1 s/p 2 units RBC 3/1 & 3/2.   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 Hx of SI in the past. No active SI currently.  - Cont Lexapro started on 3/2  - follow up outpatient with PCP  Orthostatic hypotension BP improving and stable  -Monitor BP with routine vitals -Cardiac monitoring -Fall precautions  Chronic systolic heart failure (HCC) Echo showed mildly improved EF of 35 to 40% with LV global hypokinesis. - Hold home med Entresto and Metoprolol in the setting of orthostatic  hypotension, will add back once pressures improve  - Follow-up with heart care outpatient.  Diabetes ([place]) Stable. Patient has not had a great appetite. Requiring only 3u SAI. -sSSI    FEN/GI: heart healthy diet  PPx: SCDs, high risk for bleeding  Dispo: Barriers include ongoing medical workup with GI.   Subjective:  No acute events overnight. Resting comfortably.   Objective: Temp:  [97.3 F (36.3 C)-97.9 F (36.6 C)] 97.9 F (36.6 C) (03/04 1030) Pulse Rate:  [64-74] 72 (03/04 1030) Resp:  [10-20] 20 (03/04 1030) BP: (100-134)/(61-82) 132/75 (03/04 1030) SpO2:  [98 %-100 %] 100 % (03/04 1030) Physical Exam: Chronically ill-appearing, no acute distress Cardio: Regular rate, regular rhythm, no murmurs on exam. Pulm: Clear, no wheezing, no crackles. No increased work of breathing Abdominal: bowel sounds present, soft, non-tender, non-distended Extremities: no peripheral edema  Neuro: alert and oriented x3, speech normal in content, no facial asymmetry, strength intact and equal bilaterally in UE and LE, pupils equal and reactive to light.   Laboratory: Most recent CBC Lab Results  Component Value Date   WBC 5.7 10/07/2022   HGB 9.7 (L) 10/07/2022   HCT 28.1 (L) 10/07/2022   MCV 88.9 10/07/2022   PLT 236 10/07/2022   Most recent BMP    Latest Ref Rng & Units 10/07/2022    7:34 AM  BMP  Glucose 70 - 99 mg/dL 120   BUN 8 - 23 mg/dL 12   Creatinine 0.61 - 1.24 mg/dL 1.06   Sodium 135 - 145 mmol/L 143   Potassium 3.5 - 5.1 mmol/L 3.9  Chloride 98 - 111 mmol/L 109   CO2 22 - 32 mmol/L 21   Calcium 8.9 - 10.3 mg/dL 8.7    Darci Current, DO 10/07/2022, 11:30 AM  PGY-1, [place] Intern pager: [phone], text pages welcome Secure chat group [place]

## 2022-10-07 NOTE — Interval H&P Note (Signed)
History and Physical Interval Note: Patient here for EGD And colonoscopy to evaluate anemia , heme positive stool while on Plavix. NO overt bleeding. NO prior EGD and colonoscopy. Plavix off since 2/28, also off heparin drip. I have discussed risks / benefits of the procedure and anesthesia with him, he wishes to proceed. Further recommendations pending the results.  10/07/2022 9:17 AM  [name]  has presented today for surgery, with the diagnosis of Anemia, + FOBT.  The various methods of treatment have been discussed with the patient and family. After consideration of risks, benefits and other options for treatment, the patient has consented to  Procedure(s): ESOPHAGOGASTRODUODENOSCOPY (EGD) WITH PROPOFOL (N/A) COLONOSCOPY WITH PROPOFOL (N/A) as a surgical intervention.  The patient's history has been reviewed, patient examined, no change in status, stable for surgery.  I have reviewed the patient's chart and labs.  Questions were answered to the patient's satisfaction.     [place]

## 2022-10-08 ENCOUNTER — Encounter (HOSPITAL_COMMUNITY)
Admission: EM | Disposition: A | Discharge status: Home / Self Care | Payer: Self-pay | Source: Home / Self Care | Attending: Family Medicine

## 2022-10-08 ENCOUNTER — Inpatient Hospital Stay (HOSPITAL_COMMUNITY): Payer: Medicare Other | Admitting: Anesthesiology

## 2022-10-08 ENCOUNTER — Encounter (HOSPITAL_COMMUNITY): Payer: Self-pay | Admitting: Family Medicine

## 2022-10-08 DIAGNOSIS — Z7901 Long term (current) use of anticoagulants: Secondary | ICD-10-CM

## 2022-10-08 DIAGNOSIS — R42 Dizziness and giddiness: Secondary | ICD-10-CM | POA: Diagnosis not present

## 2022-10-08 DIAGNOSIS — I2510 Atherosclerotic heart disease of native coronary artery without angina pectoris: Secondary | ICD-10-CM

## 2022-10-08 DIAGNOSIS — K648 Other hemorrhoids: Secondary | ICD-10-CM

## 2022-10-08 DIAGNOSIS — I509 Heart failure, unspecified: Secondary | ICD-10-CM

## 2022-10-08 DIAGNOSIS — R55 Syncope and collapse: Secondary | ICD-10-CM | POA: Diagnosis not present

## 2022-10-08 DIAGNOSIS — I110 Hypertensive heart disease with heart failure: Secondary | ICD-10-CM

## 2022-10-08 DIAGNOSIS — D649 Anemia, unspecified: Secondary | ICD-10-CM | POA: Diagnosis not present

## 2022-10-08 DIAGNOSIS — K922 Gastrointestinal hemorrhage, unspecified: Secondary | ICD-10-CM | POA: Diagnosis not present

## 2022-10-08 HISTORY — PX: COLONOSCOPY WITH PROPOFOL: SHX5780

## 2022-10-08 LAB — GLUCOSE, CAPILLARY
Glucose-Capillary: 83 mg/dL (ref 70–99)
Glucose-Capillary: 167 mg/dL — ABNORMAL HIGH (ref 70–99)
Glucose-Capillary: 126 mg/dL — ABNORMAL HIGH (ref 70–99)

## 2022-10-08 LAB — CBC
HCT: 29.8 % — ABNORMAL LOW (ref 39.0–52.0)
Hemoglobin: 10.1 g/dL — ABNORMAL LOW (ref 13.0–17.0)
MCH: 30.8 pg (ref 26.0–34.0)
MCHC: 33.9 g/dL (ref 30.0–36.0)
MCV: 90.9 fL (ref 80.0–100.0)
Platelets: 285 10*3/uL (ref 150–400)
RBC: 3.28 MIL/uL — ABNORMAL LOW (ref 4.22–5.81)
RDW: 14.9 % (ref 11.5–15.5)
WBC: 7.3 10*3/uL (ref 4.0–10.5)
nRBC: 0 % (ref 0.0–0.2)

## 2022-10-08 SURGERY — COLONOSCOPY WITH PROPOFOL
Anesthesia: Monitor Anesthesia Care

## 2022-10-08 MED ORDER — LACTATED RINGERS IV SOLN: INTRAVENOUS | Status: DC | PRN

## 2022-10-08 MED ORDER — PROPOFOL 500 MG/50ML IV EMUL
INTRAVENOUS | Status: DC | PRN
  Administered 2022-10-08: 125 ug/kg/min via INTRAVENOUS

## 2022-10-08 MED ORDER — LIDOCAINE 2% (20 MG/ML) 5 ML SYRINGE
INTRAMUSCULAR | Status: DC | PRN
  Administered 2022-10-08: 60 mg via INTRAVENOUS

## 2022-10-08 MED ORDER — PANTOPRAZOLE INFUSION (NEW) - SIMPLE MED
8.0000 mg/h | INTRAVENOUS | Status: AC
  Administered 2022-10-08 – 2022-10-09 (×3): 8 mg/h via INTRAVENOUS
  Filled 2022-10-08 (×6): qty 100

## 2022-10-08 MED ORDER — PHENYLEPHRINE 80 MCG/ML (10ML) SYRINGE FOR IV PUSH (FOR BLOOD PRESSURE SUPPORT)
PREFILLED_SYRINGE | INTRAVENOUS | Status: DC | PRN
  Administered 2022-10-08 (×3): 160 ug via INTRAVENOUS

## 2022-10-08 MED ORDER — PROPOFOL 10 MG/ML IV BOLUS
INTRAVENOUS | Status: DC | PRN
  Administered 2022-10-08: 20 mg via INTRAVENOUS

## 2022-10-08 SURGICAL SUPPLY — 22 items

## 2022-10-08 NOTE — Anesthesia Procedure Notes (Signed)
Procedure Name: MAC Date/Time: 10/08/2022 7:25 AM  Performed by: Griffin Dakin, CRNAPre-anesthesia Checklist: Patient identified, Emergency Drugs available, Suction available, Patient being monitored and Timeout performed Patient Re-evaluated:Patient Re-evaluated prior to induction Oxygen Delivery Method: Simple face mask Induction Type: IV induction Placement Confirmation: positive ETCO2 and breath sounds checked- equal and bilateral Dental Injury: Teeth and Oropharynx as per pre-operative assessment

## 2022-10-08 NOTE — Interval H&P Note (Signed)
History and Physical Interval Note: Colonoscopy yesterday had a poor prep, procedure aborted. HE did another prep with additional Miralax, states he cleared and okay to proceed today. NO prior colonoscopy. YEsterday EGD showed gastric ulcer with vessel that was cauterized. No bleeding since then. That is the likely cause of his anemia but he has never had a prior colonoscopy and needs to resume antiplatelet / anticoagulation and wanted to get colonoscopy done prior to discharge. Otherwise feels well today without complaints. Discussed risks / benefits he wishes to proceed.  10/08/2022 7:16 AM  Mike [name]  has presented today for surgery, with the diagnosis of anemia, heme positive stool.  The various methods of treatment have been discussed with the patient and family. After consideration of risks, benefits and other options for treatment, the patient has consented to  Procedure(s): COLONOSCOPY WITH PROPOFOL (N/A) as a surgical intervention.  The patient's history has been reviewed, patient examined, no change in status, stable for surgery.  I have reviewed the patient's chart and labs.  Questions were answered to the patient's satisfaction.     [place]

## 2022-10-08 NOTE — Transfer of Care (Signed)
Immediate Anesthesia Transfer of Care Note  Patient: Mike [name]  Procedure(s) Performed: COLONOSCOPY WITH PROPOFOL  Patient Location: PACU  Anesthesia Type:MAC  Level of Consciousness: oriented and drowsy  Airway & Oxygen Therapy: Patient Spontanous Breathing  Post-op Assessment: Report given to RN and Post -op Vital signs reviewed and stable  Post vital signs: Reviewed and stable  Last Vitals:  Vitals Value Taken Time  BP    Temp    Pulse    Resp    SpO2      Last Pain:  Vitals:   10/08/22 0651  TempSrc: Temporal  PainSc: 0-No pain         Complications: No notable events documented.

## 2022-10-08 NOTE — Progress Notes (Signed)
     Daily Progress Note Intern Pager: [phone]  Patient name: Mike [name] Medical record number: [id number] Date of birth: [birth year] Age: 67 y.o. Gender: male  Primary Care Provider: [name], DO Consultants: GI, vascular  Code Status: Full code   Pt Overview and Major Events to Date:  3/1: Admitted; Transfused 1 unit RBC  3/2: Transfused 1 unit RBC 3/4: EGD and colonoscopy (aborted due to poor prep) 3/5: Repeat colonoscopy   Assessment and Plan:  Mike [name] is a 67 year old male presenting with orthostatic hypotension on, shortness of breath and weakness.  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 Pertinent PMH/PSH includes HTN, T2DM, CVA, HLD, CHF.     Colonoscopy was generally normal.  Gastric ulcer was identified during EGD yesterday.  GI is recommending 72 hours IV PPI, trending hemoglobin, resuming ASA. Awaiting CBC to be collected.   * Anemia - GI consulted, appreciate recommendations  - S/P EGD 3/4 and colonoscopy 3/5 - Protonix IV for 72 hours  - CBC daily  - Transfusion threshold <8   Depression, recurrent ([place]) Patient endorsing depression this admission. Hx of SI in the past. No active SI currently.  - Cont Lexapro started on 3/2  - follow up outpatient with PCP  Orthostatic hypotension Resolved, BP stable.  - DC cardiac monitoring   Chronic systolic heart failure (HCC) Echo showed mildly improved EF of 35 to 40% with LV global hypokinesis. - Hold home med Entresto and Metoprolol in the setting of orthostatic hypotension, will add back once pressures improve  - Follow-up with heart care outpatient.  Diabetes ([place]) Stable. Patient has not had a great appetite. Requiring only 2u SAI. -sSSI   FEN/GI: heart healthy PPx: SCDs, high risk for bleeding  Dispo:Home pending clinical improvement . Barriers include ongoing medical management.   Subjective:  NAEO, patient resting comfortably. Doing well  after procedure   Objective: Temp:  [97.7 F (36.5 C)-98 F (36.7 C)] 97.7 F (36.5 C) (03/05 0800) Pulse Rate:  [62-84] 67 (03/05 0815) Resp:  [13-29] 13 (03/05 0815) BP: (100-148)/(57-89) 124/78 (03/05 0815) SpO2:  [94 %-100 %] 99 % (03/05 0815) Weight:  [67.8 kg] 67.8 kg (03/05 0651) Physical Exam: Chronically ill-appearing, no acute distress Cardio: Regular rate, regular rhythm, no murmurs on exam. Pulm: Clear, no wheezing, no crackles. No increased work of breathing Abdominal: bowel sounds present, soft, non-tender, non-distended Extremities: no peripheral edema  Neuro: alert and oriented x3, speech normal in content, no facial asymmetry.    Laboratory: Most recent CBC Lab Results  Component Value Date   WBC 5.7 10/07/2022   HGB 9.7 (L) 10/07/2022   HCT 28.1 (L) 10/07/2022   MCV 88.9 10/07/2022   PLT 236 10/07/2022   Most recent BMP    Latest Ref Rng & Units 10/07/2022    7:34 AM  BMP  Glucose 70 - 99 mg/dL 120   BUN 8 - 23 mg/dL 12   Creatinine 0.61 - 1.24 mg/dL 1.06   Sodium 135 - 145 mmol/L 143   Potassium 3.5 - 5.1 mmol/L 3.9   Chloride 98 - 111 mmol/L 109   CO2 22 - 32 mmol/L 21   Calcium 8.9 - 10.3 mg/dL 8.7    Darci Current, DO 10/08/2022, 9:54 AM  PGY-1, [place] Intern pager: [phone], text pages welcome Secure chat group [facility] Teaching Service

## 2022-10-08 NOTE — Assessment & Plan Note (Deleted)
Resolved, BP stable.  - DC cardiac monitoring

## 2022-10-08 NOTE — Op Note (Signed)
[facility] Patient Name: Mike [name] Procedure Date : 10/08/2022 MRN: [id number] Attending MD: Carlota Raspberry. Havery Moros , MD, [id number] Date of Birth: 02/05/1956 CSN: [id number] Age: 67 Admit Type: Inpatient Procedure:                Colonoscopy Indications:              Heme positive stool - anemia, on Aspirin / Plavix                            as outpatient, no prior colonoscopy. Exam yesterday                            limited by poor prep and procedure aborted. EGD                            showed gastric ulcer as likely cause but given need                            to resume antiplatelet therapy, etc, we elected to                            also clear his colon given no prior exams.                            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 Havery Moros, MD, Frazier Richards, Technician Referring MD:              Medicines:                Monitored Anesthesia Care Complications:            No immediate complications. Estimated blood loss:                            None. Estimated Blood Loss:     Estimated blood loss: none. Procedure:                Pre-Anesthesia Assessment:                           - Prior to the procedure, a History and Physical                            was performed, and patient medications and                            allergies were reviewed. The patient's tolerance of                            previous anesthesia was also reviewed. The risks  and benefits of the procedure and the sedation                            options and risks were discussed with the patient.                            All questions were answered, and informed consent                            was obtained. Prior Anticoagulants: The patient has                            taken Plavix (clopidogrel), last dose was 6  days                            prior to procedure. ASA Grade Assessment: III - A                            patient with severe systemic disease. After                            reviewing the risks and benefits, the patient was                            deemed in satisfactory condition to undergo the                            procedure.                           After obtaining informed consent, the colonoscope                            was passed under direct vision. Throughout the                            procedure, the patient's blood pressure, pulse, and                            oxygen saturations were monitored continuously. The                            PCF-190TL [id number] Olympus colonoscope was                            introduced through the anus and advanced to the the                            cecum, identified by appendiceal orifice and                            ileocecal valve. The colonoscopy was performed  without difficulty. The patient tolerated the                            procedure well. The quality of the bowel                            preparation was fair. The ileocecal valve,                            appendiceal orifice, and rectum were photographed. Scope In: 7:30:38 AM Scope Out: 7:53:27 AM Scope Withdrawal Time: 0 hours 16 minutes 40 seconds  Total Procedure Duration: 0 hours 22 minutes 49 seconds  Findings:      The perianal and digital rectal examinations were normal.      A large amount of semi-liquid stool was found in the entire colon,       making visualization difficult. Extensive lavage was performed using       copious amounts of sterile water, resulting in incomplete clearance with       fair visualization. There was significant sediment that was difficult to       clear. Dependant portions of the colon were most affected - most stool       was cleared lavaged, however certain small areas were not as well        visualized. Overall no large mass lesions or polyps noted. Smaller or       flat polyps may not have been appreciated.      Internal hemorrhoids were found during retroflexion. The hemorrhoids       were moderate.      The exam was otherwise without abnormality. Impression:               - Preparation of the colon was fair. Extensive                            lavage done as above                           - Internal hemorrhoids.                           - The examination was otherwise normal.                           No large polyps or mass lesions, no stigmata or                            pathology to cause bleeding. Brown stool noted                            throughout. Small or flat polyps may not have been                            appreciated. Recommendation:           - Return patient to hospital ward for ongoing care.                           -  Full liquid diet (advanced from yesterday given                            ulcer treatment)                           - Continue present medications.                           - Will need total of 72 hours IV PPI from                            yesterday's EGD                           - Trend Hgb                           - Okay to resume aspirin                           - Okay to place back on heparin drip if needed for                            recent thrombosis and monitor for bleeding symptoms                           - Given fair prep, repeat colonoscopy as outpatient                            once he has recovered from his recent issues and                            okay to hold anticoagulation for a period of time.                            Would need 2 day prep                           - We will reassess him tomorrow call with questions Procedure Code(s):        --- Professional ---                           [phone], Colonoscopy, flexible; diagnostic, including                            collection of specimen(s)  by brushing or washing,                            when performed (separate procedure) Diagnosis Code(s):        --- Professional ---                           [id number], Other hemorrhoids  R19.5, Other fecal abnormalities CPT copyright 2022 American Medical Association. All rights reserved. The codes documented in this report are preliminary and upon coder review may  be revised to meet current compliance requirements. Remo Lipps P. Havery Moros, MD 10/08/2022 8:08:58 AM This report has been signed electronically. Number of Addenda: 0

## 2022-10-08 NOTE — Anesthesia Postprocedure Evaluation (Signed)
Anesthesia Post Note  Patient: Mike [name]  Procedure(s) Performed: COLONOSCOPY WITH PROPOFOL     Patient location during evaluation: PACU Anesthesia Type: MAC Level of consciousness: awake and alert Pain management: pain level controlled Vital Signs Assessment: post-procedure vital signs reviewed and stable Respiratory status: spontaneous breathing, nonlabored ventilation and respiratory function stable Cardiovascular status: stable and blood pressure returned to baseline Anesthetic complications: no   No notable events documented.  Last Vitals:  Vitals:   10/08/22 0800 10/08/22 0815  BP: (!) 110/57 124/78  Pulse: 67 67  Resp: 14 13  Temp: 36.5 C   SpO2: 100% 99%    Last Pain:  Vitals:   10/08/22 0800  TempSrc:   PainSc: 0-No pain                 Audry Pili

## 2022-10-08 NOTE — Progress Notes (Signed)
Mobility Specialist Progress Note   10/08/22 1500  Mobility  Activity Ambulated independently in hallway  Level of Assistance Independent after set-up  Assistive Device None  Distance Ambulated (ft) 860 ft  Activity Response Tolerated well  Mobility Referral Yes  $Mobility charge 1 Mobility   Patient received in bed eager to participate in mobility. Ambulated in hallway independently with steady gait. Returned to room without complaint or incident. Was left back in room with all needs met, call bell in reach.   Holland Falling Mobility Specialist Please contact via SecureChat or  Rehab office at [phone]

## 2022-10-08 NOTE — Progress Notes (Signed)
At approximately [phone] pt was wheelchair down to his procedure. Pt is alert and oriented times 4.  Pt ambulated to the bathroom several times do having a bowel prep for a procedure, noted. Pt remain NPO this shift, noted. Pt vital sign are stable.

## 2022-10-09 ENCOUNTER — Other Ambulatory Visit: Payer: Self-pay

## 2022-10-09 ENCOUNTER — Other Ambulatory Visit (HOSPITAL_COMMUNITY): Payer: Medicare HMO

## 2022-10-09 DIAGNOSIS — K250 Acute gastric ulcer with hemorrhage: Secondary | ICD-10-CM

## 2022-10-09 DIAGNOSIS — Z7902 Long term (current) use of antithrombotics/antiplatelets: Secondary | ICD-10-CM

## 2022-10-09 DIAGNOSIS — K31819 Angiodysplasia of stomach and duodenum without bleeding: Secondary | ICD-10-CM

## 2022-10-09 DIAGNOSIS — D500 Iron deficiency anemia secondary to blood loss (chronic): Secondary | ICD-10-CM

## 2022-10-09 LAB — GLUCOSE, CAPILLARY
Glucose-Capillary: 83 mg/dL (ref 70–99)
Glucose-Capillary: 90 mg/dL (ref 70–99)
Glucose-Capillary: 165 mg/dL — ABNORMAL HIGH (ref 70–99)
Glucose-Capillary: 176 mg/dL — ABNORMAL HIGH (ref 70–99)

## 2022-10-09 LAB — BASIC METABOLIC PANEL
Anion gap: 4 — ABNORMAL LOW (ref 5–15)
BUN: 7 mg/dL — ABNORMAL LOW (ref 8–23)
CO2: 25 mmol/L (ref 22–32)
Calcium: 8.3 mg/dL — ABNORMAL LOW (ref 8.9–10.3)
Chloride: 109 mmol/L (ref 98–111)
Creatinine, Ser: 0.91 mg/dL (ref 0.61–1.24)
GFR, Estimated: >60 mL/min (ref >60)
Glucose, Bld: 89 mg/dL (ref 70–99)
Potassium: 3.7 mmol/L (ref 3.5–5.1)
Sodium: 138 mmol/L (ref 135–145)

## 2022-10-09 LAB — CBC
HCT: 24.4 % — ABNORMAL LOW (ref 39.0–52.0)
Hemoglobin: 8.7 g/dL — ABNORMAL LOW (ref 13.0–17.0)
MCH: 31.5 pg (ref 26.0–34.0)
MCHC: 35.7 g/dL (ref 30.0–36.0)
MCV: 88.4 fL (ref 80.0–100.0)
Platelets: 231 10*3/uL (ref 150–400)
RBC: 2.76 MIL/uL — ABNORMAL LOW (ref 4.22–5.81)
RDW: 14.9 % (ref 11.5–15.5)
WBC: 5.3 10*3/uL (ref 4.0–10.5)
nRBC: 0 % (ref 0.0–0.2)

## 2022-10-09 LAB — SURGICAL PATHOLOGY

## 2022-10-09 MED ORDER — ACETAMINOPHEN 325 MG PO TABS
650.0000 mg | ORAL_TABLET | Freq: Four times a day (QID) | ORAL | Status: DC | PRN
  Administered 2022-10-09: 650 mg via ORAL
  Filled 2022-10-09: qty 2

## 2022-10-09 MED ORDER — PANTOPRAZOLE SODIUM 40 MG PO TBEC: 40.0000 mg | DELAYED_RELEASE_TABLET | Freq: Two times a day (BID) | ORAL | Status: DC

## 2022-10-09 MED ORDER — PANTOPRAZOLE SODIUM 40 MG PO TBEC
40.0000 mg | DELAYED_RELEASE_TABLET | Freq: Two times a day (BID) | ORAL | Status: DC
  Administered 2022-10-10: 40 mg via ORAL
  Filled 2022-10-09: qty 1

## 2022-10-09 MED ORDER — CLARITHROMYCIN 500 MG PO TABS
500.0000 mg | ORAL_TABLET | Freq: Two times a day (BID) | ORAL | Status: DC
  Administered 2022-10-09 – 2022-10-10 (×2): 500 mg via ORAL
  Filled 2022-10-09 (×3): qty 1

## 2022-10-09 MED ORDER — METRONIDAZOLE 500 MG PO TABS
500.0000 mg | ORAL_TABLET | Freq: Two times a day (BID) | ORAL | Status: DC
  Administered 2022-10-09 – 2022-10-10 (×2): 500 mg via ORAL
  Filled 2022-10-09 (×2): qty 1

## 2022-10-09 MED ORDER — ASPIRIN 81 MG PO TBEC
81.0000 mg | DELAYED_RELEASE_TABLET | Freq: Every day | ORAL | Status: DC
  Administered 2022-10-09 – 2022-10-10 (×2): 81 mg via ORAL
  Filled 2022-10-09 (×2): qty 1

## 2022-10-09 MED ORDER — AMOXICILLIN 500 MG PO CAPS
1000.0000 mg | ORAL_CAPSULE | Freq: Two times a day (BID) | ORAL | Status: DC
  Administered 2022-10-09 – 2022-10-10 (×2): 1000 mg via ORAL
  Filled 2022-10-09 (×3): qty 2

## 2022-10-09 NOTE — Final Progress Note (Signed)
Progress Note   Subjective  Feels well. No blood per rectum or symptoms concerning for bleeding.    Objective   Vital signs in last 24 hours: Temp:  [97.8 F (36.6 C)-98.2 F (36.8 C)] 97.9 F (36.6 C) (03/06 0355) Pulse Rate:  [66-88] 71 (03/06 0355) Resp:  [13-18] 18 (03/05 1610) BP: (120-143)/(70-85) 132/85 (03/06 0355) SpO2:  [98 %-100 %] 98 % (03/05 1610) Last BM Date : 10/04/22 General:    male in NAD Neurologic:  Alert and oriented,  grossly normal neurologically. Psych:  Cooperative. Normal mood and affect.  Intake/Output from previous day: 03/05 0701 - 03/06 0700 In: 480 [P.O.:480] Out: -  Intake/Output this shift: Total I/O In: 115.5 [I.V.:115.5] Out: -   Lab Results: Recent Labs    10/07/22 0734 10/08/22 1251 10/09/22 0711  WBC 5.7 7.3 5.3  HGB 9.7* 10.1* 8.7*  HCT 28.1* 29.8* 24.4*  PLT 236 285 231   BMET Recent Labs    10/07/22 0734 10/09/22 0711  NA 143 138  K 3.9 3.7  CL 109 109  CO2 21* 25  GLUCOSE 120* 89  BUN 12 7*  CREATININE 1.06 0.91  CALCIUM 8.7* 8.3*   LFT No results for input(s): "PROT", "ALBUMIN", "AST", "ALT", "ALKPHOS", "BILITOT", "BILIDIR", "IBILI" in the last 72 hours. PT/INR No results for input(s): "LABPROT", "INR" in the last 72 hours.  Studies/Results: No results found.     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 Suspect the ulcer was the cause of his anemia / bleeding. Pathology pending to rule out H pylori. Colonoscopy done as he had never had one - first was aborted due to prep, had complete exam yesterday with still fair prep but no high risk lesions and brown stool noted throughout without blood.   Hgb has drifted but BUN normalized and no bleeding symptoms. I think  re-equilibration. Recommend completing IV protonix drip through tomorrow AM and then transition to '40mg'$  twice daily for 4 weeks, and then once daily thereafter. Given ongoing need for both aspirin and Plavix in light of relatively recent CVA, would keep on protonix prophylaxis indefinitely. He should avoid all other NSAIDs.  Aspirin has been okay to be resumed, please start that. I think given recent CVA and hemostasis has been achieved can resume Plavix later today or tomorrow. Okay to advance to regular diet. Would discharge tomorrow if otherwise stable once he completes PPI drip.  We can follow him up as outpatient. He will need Hgb checked in one week after discharge. Will consider repeat EGD as outpatient to check for mucosal healing and repeat colonoscopy with 2 day prep at some point if he is willing to make sure his screening is up to date after fair prep yesterday. He agrees.  PLAN: - continue 72 hours of IV protonix (due to end tomorrow AM), and then transition to '40mg'$  protonix PO BID for 4 weeks, and then once daily thereafter - resume aspirin - resume Plavix later today or tomorrow - regular diet - await pathology results, rule out H pylori - repeat Hgb one week as outpatient - we will coordinate outpatient follow up - relook EGD and consider repeat colonoscopy with 2 day prep if he is willing to complete  his screening  We will sign off for now, call with questions.  Jolly Mango, MD [facility]

## 2022-10-09 NOTE — Progress Notes (Signed)
Confirmed with Md patient is med surg, no tele at this time per Dr Sabra Heck.

## 2022-10-09 NOTE — Progress Notes (Signed)
Pathology results returned from EGD:  FINAL MICROSCOPIC DIAGNOSIS:   A. STOMACH, BIOPSY:  Marked chronic, focally active gastritis with lymphoid aggregates  Helicobacter present (IHC, adequate control)  Negative for intestinal metaplasia, dysplasia and carcinoma    He tested positive for H pylori causing the ulcer.  In this light I am recommend the following treatment regimen for 14 days upon discharge: Amoxicillin 1gm BID Clarithromycin '500mg'$  BID Flagyl '500mg'$  BID Protonix '40mg'$  twice daily  The patient should avoid alcohol while taking Flagyl.   He has follow up scheduled with [place] on 4/26 at 1110 at our office for reassessment.  Call if questions.  Jolly Mango, MD [facility]

## 2022-10-09 NOTE — Progress Notes (Signed)
Daily Progress Note Intern Pager: [phone]  Patient name: Mike [name] Medical record number: [id number] Date of birth: 12/21/1955 Age: 67 y.o. Gender: male  Primary Care Provider: [name], DO Consultants: GI Code Status: Full code   Pt Overview and Major Events to Date:  3/1: Admitted; Transfused 1 unit RBC  3/2: Transfused 1 unit RBC 3/4: EGD and colonoscopy (aborted due to poor prep) 3/5: Repeat colonoscopy    Assessment and Plan:  Mike [name] is a 67 year old male presenting with orthostatic hypotension on, shortness of breath and weakness.  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 Pertinent PMH/PSH includes HTN, T2DM, CVA, HLD, CHF.      Stable on Protonix drip. Per GI will continue drip today, advance diet, and start ASA. Hgb dropped to 8.7 but patient denies bloody or melanotic stool. Will continue to check CBC daily. If patient begins having signs of bleeding will recheck CBC this afternoon. Transfusion threshold remains at 8.   * Anemia - GI consulted, appreciate recommendations  - S/P EGD 3/4 and colonoscopy 3/5 - Protonix IV for 72 hours  - CBC daily  - Transfusion threshold <8  - Advance diet  - Restart ASA 3/6 - Restart Plavix today or tomorrow   Depression, recurrent ([place]) Patient endorsing depression this admission. Hx of SI in the past. No active SI currently.  - Cont Lexapro started on 3/2  - follow up outpatient with PCP  Chronic systolic heart failure ([place]) Echo showed mildly improved EF of 35 to 40% with LV global hypokinesis. - Hold home med Entresto and Metoprolol in the setting of orthostatic hypotension, will add back once pressures improve  - Follow-up with heart care outpatient.  Diabetes ([place]) Stable. Patient has not had a great appetite. Requiring only 2u SAI. -sSSI   FEN/GI: advance to regular diet  PPx: SCDs, high risk GI bleed Dispo:Home tomorrow. Barriers include IV Protonix  infusion.   Subjective:  NAEO, alert and feeling much better. Denies bloody or dark bowel movements.   Objective: Temp:  [97.8 F (36.6 C)-98.2 F (36.8 C)] 98.1 F (36.7 C) (03/06 0822) Pulse Rate:  [63-88] 63 (03/06 0822) Resp:  [18] 18 (03/06 0822) BP: (120-143)/(70-85) 131/75 (03/06 0822) SpO2:  [98 %-100 %] 98 % (03/05 1610) Physical Exam: Well-appearing, no acute distress Cardio: Regular rate, regaulr rhythm, no murmurs on exam. Pulm: Clear, no wheezing, no crackles. No increased work of breathing Abdominal: bowel sounds present, soft, non-tender, non-distended Extremities: no peripheral edema  Neuro: alert and oriented x3, speech normal in content, no facial asymmetry  Laboratory: Most recent CBC Lab Results  Component Value Date   WBC 5.3 10/09/2022   HGB 8.7 (L) 10/09/2022   HCT 24.4 (L) 10/09/2022   MCV 88.4 10/09/2022   PLT 231 10/09/2022   Most recent BMP    Latest Ref Rng & Units 10/09/2022    7:11 AM  BMP  Glucose 70 - 99 mg/dL 89   BUN 8 - 23 mg/dL 7   Creatinine 0.61 - 1.24 mg/dL 0.91   Sodium 135 - 145 mmol/L 138   Potassium 3.5 - 5.1 mmol/L 3.7   Chloride 98 - 111 mmol/L 109   CO2 22 - 32 mmol/L 25   Calcium 8.9 - 10.3 mg/dL 8.3    Darci Current, DO 10/09/2022, 9:23 AM  PGY-1, [place] Intern pager: [phone], text pages welcome Secure chat group [place]

## 2022-10-10 ENCOUNTER — Encounter (HOSPITAL_COMMUNITY): Payer: Self-pay | Admitting: Gastroenterology

## 2022-10-10 ENCOUNTER — Other Ambulatory Visit (HOSPITAL_COMMUNITY): Payer: Self-pay

## 2022-10-10 DIAGNOSIS — F339 Major depressive disorder, recurrent, unspecified: Secondary | ICD-10-CM

## 2022-10-10 DIAGNOSIS — Z7902 Long term (current) use of antithrombotics/antiplatelets: Secondary | ICD-10-CM

## 2022-10-10 DIAGNOSIS — Z7901 Long term (current) use of anticoagulants: Secondary | ICD-10-CM

## 2022-10-10 DIAGNOSIS — K250 Acute gastric ulcer with hemorrhage: Principal | ICD-10-CM

## 2022-10-10 DIAGNOSIS — D500 Iron deficiency anemia secondary to blood loss (chronic): Secondary | ICD-10-CM

## 2022-10-10 DIAGNOSIS — I951 Orthostatic hypotension: Secondary | ICD-10-CM

## 2022-10-10 DIAGNOSIS — K31819 Angiodysplasia of stomach and duodenum without bleeding: Secondary | ICD-10-CM

## 2022-10-10 LAB — CBC
HCT: 26 % — ABNORMAL LOW (ref 39.0–52.0)
Hemoglobin: 8.8 g/dL — ABNORMAL LOW (ref 13.0–17.0)
MCH: 30.3 pg (ref 26.0–34.0)
MCHC: 33.8 g/dL (ref 30.0–36.0)
MCV: 89.7 fL (ref 80.0–100.0)
Platelets: 270 10*3/uL (ref 150–400)
RBC: 2.9 MIL/uL — ABNORMAL LOW (ref 4.22–5.81)
RDW: 15 % (ref 11.5–15.5)
WBC: 4.7 10*3/uL (ref 4.0–10.5)
nRBC: 0 % (ref 0.0–0.2)

## 2022-10-10 LAB — GLUCOSE, CAPILLARY
Glucose-Capillary: 82 mg/dL (ref 70–99)
Glucose-Capillary: 286 mg/dL — ABNORMAL HIGH (ref 70–99)

## 2022-10-10 MED ORDER — AMOXICILLIN 500 MG PO CAPS
1000.0000 mg | ORAL_CAPSULE | Freq: Two times a day (BID) | ORAL | 0 refills | Status: AC
  Filled 2022-10-10: qty 56, 14d supply, fill #0

## 2022-10-10 MED ORDER — PANTOPRAZOLE SODIUM 40 MG PO TBEC
40.0000 mg | DELAYED_RELEASE_TABLET | Freq: Two times a day (BID) | ORAL | 0 refills | Status: DC
  Filled 2022-10-10: qty 60, 30d supply, fill #0

## 2022-10-10 MED ORDER — CLARITHROMYCIN 500 MG PO TABS
500.0000 mg | ORAL_TABLET | Freq: Two times a day (BID) | ORAL | 0 refills | Status: AC
  Filled 2022-10-10: qty 28, 14d supply, fill #0

## 2022-10-10 MED ORDER — METRONIDAZOLE 500 MG PO TABS
500.0000 mg | ORAL_TABLET | Freq: Two times a day (BID) | ORAL | 0 refills | Status: AC
  Filled 2022-10-10: qty 28, 14d supply, fill #0

## 2022-10-10 MED ORDER — ESCITALOPRAM OXALATE 10 MG PO TABS
10.0000 mg | ORAL_TABLET | Freq: Every day | ORAL | 0 refills | Status: DC
  Filled 2022-10-10: qty 30, 30d supply, fill #0

## 2022-10-10 NOTE — Discharge Summary (Signed)
[facility] Discharge Summary  Patient name: Mike [name] Medical record number: [id number] Date of birth: 1956/08/02 Age: 67 y.o. Gender: male Date of Admission: 10/03/2022  Date of Discharge: 10/10/22  Admitting Physician: Lind Covert, MD  Primary Care Provider: [name], DO Consultants: GI  Indication for Hospitalization: Hypotension   Discharge Diagnoses/Problem List:  Principal Problem for Admission: Gastric Ulcer  Other Problems addressed during stay:  Principal Problem:   Anemia Active Problems:   Depression, recurrent (HCC)   Diabetes ([place])   Chronic systolic heart failure (HCC)   Mood changes   Orthostatic hypotension   Atherosclerotic plaque   Gastrointestinal hemorrhage   Postural dizziness with presyncope   Acute anemia   Heme positive stool   Anemia, posthemorrhagic, acute   General weakness   Symptomatic anemia   Anticoagulated   Acute gastric ulcer with hemorrhage   Gastric AVM   Antiplatelet or antithrombotic long-term use    Brief Hospital Course:  Mike [name] is a 67 year old male presenting with orthostatic hypotension on, shortness of breath and weakness.  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 Pertinent PMH/PSH includes HTN, T2DM, CVA, HLD, CHF.  His hospital course is outlined below:   Anemia  Gastric Ulcer 2/2 H Pylori:  Patient presented to the ED with hypotension and dizziness.  On admission his hemoglobin was found to have dropped from baseline of 15, to 8 requiring 2 units RBCs.  GI was consulted and recommended EGD and colonoscopy which showed a large gastric ulcer and H. pylori infection.  Colonoscopy was unremarkable.  GI recommended 72 hours of PPI drip and H. pylori treatment.  Over the course of admission patient symptoms had improved and he returned back to baseline.  He was discharged home with 14-day course of H. pylori treatment with  outpatient GI follow-up.  GI approved restarting his aspirin and Plavix due to recent CVA. Hgb was stable prior to discharge at 8.8.   Renal Artery stenosis: At presentation CT abdomen pelvis was obtained and showed possible thrombosis in the renal artery.  Vascular surgery was consulted and patient was placed on heparin drip for less than 8 hours due to drop in hemoglobin.  CT angiogram was obtained and showed renal artery stenosis and no thrombosis.  Patient was not started back on anticoagulation.   Depression: While admitted patient endorsed severe depression which has been a recurrent issue for him.  He was started on Lexapro on 3/2.  He reported improvement of symptoms and denied severe depression, SI, self-harm thoughts prior to discharge.  Hypertension: Patient's blood pressure medication was held during the beginning of this admission due to hypotension.  At discharge patient was restarted on Toprol.  Please follow-up outpatient to restart Entresto.  PCP Follow Up:  Depression, patient started on Lexapro 3/2 while hospitalized. Please titrate as indicated.  Repeat CBC to monitor Hgb 1 week after discharge, patient was restarted on aspirin and Plavix prior to discharge. Patient undergoing treatment for H Pylori, follow up for medication compliance. Please follow-up on blood pressure.  Patient was restarted on Toprol prior to discharge please monitor pressures and add back Entresto as indicated.  Disposition: home   Discharge Condition: stable   Discharge Exam:  Vitals:   10/09/22 2012 10/10/22 0807  BP: (!) 150/86 (!) 149/81  Pulse: 70 72  Resp: 18 16  Temp: 98 F (36.7 C) (!) 97.4 F (36.3 C)  SpO2: 98% 98%   Well-appearing,  no acute distress Cardio: Regular rate, regular rhythm, no murmurs on exam. Pulm: Clear, no wheezing, no crackles. No increased work of breathing Abdominal: bowel sounds present, soft, non-tender, non-distended Extremities: no peripheral edema   Neuro: alert and oriented x3, speech normal in content, no facial asymmetry   Significant Procedures:   EGD: gastric ulcer with non-bleeding vessel visible, positive for H pylori infection   Colonoscopy: Unremarkable, internal hemorrhoids   Significant Labs and Imaging:  Recent Labs  Lab 10/08/22 1251 10/09/22 0711 10/10/22 0617  WBC 7.3 5.3 4.7  HGB 10.1* 8.7* 8.8*  HCT 29.8* 24.4* 26.0*  PLT 285 231 270   Recent Labs  Lab 10/09/22 0711  NA 138  K 3.7  CL 109  CO2 25  GLUCOSE 89  BUN 7*  CREATININE 0.91  CALCIUM 8.3*    CT Abdomen and Pelvis W/WO Contrast:  1. 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 Nephrology consult and further evaluation with conventional angiography is recommended. 2. Thrombus and suspected hemodynamically significant stenosis within the midportion of the superior mesenteric artery. 3. Hepatic steatosis. 4. Enlarged prostate gland. 5. Aortic atherosclerosis.  CT Angio Abdomen and Pelvis:  1. Fibrofatty, wall adherent atherosclerotic plaque about the proximal superior mesenteric artery measuring up to approximately 2.5 cm in resulting in moderate stenosis. The celiac and inferior mesenteric arteries are patent. 2. Severe ostial stenosis of 2 of the 3 right renal arteries. 3.  Aortic Atherosclerosis (ICD10-I70.0).    Results/Tests Pending at Time of Discharge: none   Discharge Medications:  Allergies as of 10/10/2022   No Known Allergies      Medication List     STOP taking these medications    sacubitril-valsartan 24-26 MG Commonly known as: ENTRESTO       TAKE these medications    amoxicillin 500 MG capsule Commonly known as: AMOXIL Take 2 capsules (1,000 mg total) by mouth every 12 (twelve) hours for 14 days.   aspirin EC 325 MG tablet Take 1 tablet (325 mg total) by mouth daily.   atorvastatin 80 MG tablet Commonly known as: LIPITOR Take 1 tablet (80 mg total) by  mouth daily.   clarithromycin 500 MG tablet Commonly known as: BIAXIN Take 1 tablet (500 mg total) by mouth every 12 (twelve) hours for 14 days.   clopidogrel 75 MG tablet Commonly known as: PLAVIX Take 1 tablet (75 mg total) by mouth daily.   escitalopram 10 MG tablet Commonly known as: LEXAPRO Take 1 tablet (10 mg total) by mouth daily. Start taking on: October 11, 2022   metFORMIN 500 MG 24 hr tablet Commonly known as: GLUCOPHAGE-XR Take 1 tablet (500 mg total) by mouth in the morning and at bedtime.   metoprolol succinate 25 MG 24 hr tablet Commonly known as: TOPROL-XL Take 1 tablet (25 mg total) by mouth daily.   metroNIDAZOLE 500 MG tablet Commonly known as: FLAGYL Take 1 tablet (500 mg total) by mouth every 12 (twelve) hours for 14 days.   pantoprazole 40 MG tablet Commonly known as: PROTONIX Take 1 tablet (40 mg total) by mouth 2 (two) times daily.        Discharge Instructions: Please refer to Patient Instructions section of EMR for full details.  Patient was counseled important signs and symptoms that should prompt return to medical care, changes in medications, dietary instructions, activity restrictions, and follow up appointments.   Follow-Up Appointments:  [place].  Go on 10/17/2022.   Specialty: Family Medicine Why: Appointment at 9:10 am, please arrive at least 15 min prior to your scheduled appointment time. Contact information: [address] [id number] [facility] [place], [place], DO 10/10/2022, 10:55 AM PGY-1, [place]

## 2022-10-10 NOTE — Progress Notes (Signed)
Mobility Specialist Progress Note   10/10/22 1138  Mobility  Activity Ambulated independently in hallway  Level of Assistance Standby assist, set-up cues, supervision of patient - no hands on  Assistive Device None  Distance Ambulated (ft) 860 ft  Activity Response Tolerated well  Mobility Referral Yes  $Mobility charge 1 Mobility   Patient received in bed eager to participate in mobility. Ambulated in hallway independently with steady gait. Returned to room without complaint or incident. Was left back in chair with all needs met, call bell in reach.  Holland Falling Mobility Specialist Please contact via SecureChat or  Rehab office at [phone]

## 2022-10-10 NOTE — Discharge Instructions (Addendum)
Dear Mike [name],  Thank you for letting us participate in your care. You were hospitalized for dizziness and low blood pressure and diagnosed with Anemia. Due to your low hemoglobin, you received 2 units of blood transfusions.  Your hemoglobin much much better prior to discharge.   You underwent procedures to look in your stomach and colon.  We found a ulcer in your stomach that is related to a bacteria called H. pylori.   This is a bacteria that must be treated with antibiotics.  Please review your medication list and take medications as prescribed.    You have appointments with gastroenterology and family medicine outpatient to follow-up for this hospital admission.    DOCTOR'S APPOINTMENT   Future Appointments  Date Time Provider [place]  10/16/2022  2:40 PM Jerline Pain, MD [facility]  10/17/2022  9:10 AM Zola Button, MD [facility]  11/29/2022 11:10 AM Sharyn Creamer, MD [facility]  02/19/2023  1:15 PM Suzzanne Cloud, NP [facility] None    Follow-up Information     [facility]. Go on 10/17/2022.   Specialty: Family Medicine Why: Appointment at 9:10 am, please arrive at least 15 min prior to your scheduled appointment time. Contact information: [id number] [place] [phone]                Take care and be well!  [facility]  [place], [place] [address] [phone]

## 2022-10-10 NOTE — Care Management (Signed)
Patient to DC to home  No TOC needs

## 2022-10-10 NOTE — Care Management Important Message (Signed)
Important Message  Patient Details  Name: Mike [name] MRN: [id number] Date of Birth: 12/07/1955   Medicare Important Message Given:  Yes     Hannah Beat 10/10/2022, 10:56 AM

## 2022-10-11 ENCOUNTER — Telehealth: Payer: Self-pay

## 2022-10-11 NOTE — Transitions of Care (Post Inpatient/ED Visit) (Signed)
   10/11/2022  Name: Mike [name] MRN: [id number] DOB: [birth year]  Today's TOC FU Call Status: Today's TOC FU Call Status:: Unsuccessul Call (1st Attempt) Unsuccessful Call (1st Attempt) Date: 10/11/22  Attempted to reach the patient regarding the most recent Inpatient/ED visit.  Follow Up Plan: Additional outreach attempts will be made to reach the patient to complete the Transitions of Care (Post Inpatient/ED visit) call.   Signature Juanda Crumble, [place] Direct Dial [phone]

## 2022-10-14 NOTE — Transitions of Care (Post Inpatient/ED Visit) (Signed)
   10/14/2022  Name: Gotti Alwin MRN: [id number] DOB: March 28, 1956  Today's TOC FU Call Status: Today's TOC FU Call Status:: Successful TOC FU Call Competed Unsuccessful Call (1st Attempt) Date: 10/11/22 [facility] FU Call Complete Date: 10/14/22  Transition Care Management Follow-up Telephone Call Date of Discharge: 10/10/22 [facility]charge [facility]) Type of Discharge: Inpatient Admission Primary Inpatient Discharge Diagnosis:: dizziness How have you been since you were released from the hospital?: Better Any questions or concerns?: No  Items Reviewed: Did you receive and understand the discharge instructions provided?: Yes Medications obtained and verified?: Yes (Medications Reviewed) Any new allergies since your discharge?: No Dietary orders reviewed?: Yes Do you have support at home?: No  Home Care and Equipment/Supplies: [place] Ordered?: NA Any new equipment or medical supplies ordered?: NA  Functional Questionnaire: Do you need assistance with bathing/showering or dressing?: No Do you need assistance with meal preparation?: No Do you need assistance with eating?: No Do you have difficulty maintaining continence: No Do you need assistance with getting out of bed/getting out of a chair/moving?: No Do you have difficulty managing or taking your medications?: No  Folllow up appointments reviewed: PCP Follow-up appointment confirmed?: Yes Date of PCP follow-up appointment?: 10/17/22 Follow-up Provider: Dr Nancy Fetter [facility] Follow-up appointment confirmed?: NA Do you need transportation to your follow-up appointment?: No Do you understand care options if your condition(s) worsen?: Yes-patient verbalized understanding    [place], [place] Direct Dial [phone]

## 2022-10-16 ENCOUNTER — Ambulatory Visit: Payer: Medicare HMO | Attending: Cardiology | Admitting: Cardiology

## 2022-10-16 ENCOUNTER — Encounter: Payer: Self-pay | Admitting: Cardiology

## 2022-10-16 VITALS — BP 117/69 | HR 77 | Ht 66.0 in | Wt 139.0 lb

## 2022-10-16 DIAGNOSIS — K250 Acute gastric ulcer with hemorrhage: Secondary | ICD-10-CM

## 2022-10-16 DIAGNOSIS — I5022 Chronic systolic (congestive) heart failure: Secondary | ICD-10-CM | POA: Diagnosis not present

## 2022-10-16 DIAGNOSIS — I63431 Cerebral infarction due to embolism of right posterior cerebral artery: Secondary | ICD-10-CM | POA: Diagnosis not present

## 2022-10-16 DIAGNOSIS — I2510 Atherosclerotic heart disease of native coronary artery without angina pectoris: Secondary | ICD-10-CM

## 2022-10-16 MED ORDER — ASPIRIN 81 MG PO TBEC: 81.0000 mg | DELAYED_RELEASE_TABLET | Freq: Every day | ORAL | 12 refills | Status: DC

## 2022-10-16 MED ORDER — ENTRESTO 24-26 MG PO TABS: 1.0000 | ORAL_TABLET | Freq: Two times a day (BID) | ORAL | 3 refills | Status: DC

## 2022-10-16 NOTE — Progress Notes (Signed)
Cardiology Office Note:    Date:  10/16/2022   ID:  Mike [name], DOB 1955/12/02, MRN [id number]  PCP:  [name], [place] [facility] Cardiologist:  Candee Furbish, MD     Referring MD: [name], DO    History of Present Illness:    Mike [name] is a 67 y.o. male here for the follow up of chronic systolic heart failure with ejection fraction of 30 to 35%.  He was at the hospital in November 2023 with left-sided weakness facial droop secondary to right-sided lacunar stroke.  An echocardiogram was performed at that time that showed a EF of 30 to 35%.  Has a history of MI back in 2014. 3 stents.  Hemoglobin A1c was 10.4.  Discharged on Plavix.  Came in with GI bleed on 10/04/2022.  Was transfused 1 unit.  Hemoglobin was down to 7.6.  I decided to stop his Plavix and place him on aspirin 81 mg.  He was taking aspirin 325.  His Entresto was also placed on hold.  No SOB, no chest pain. He had spasm contraction on left arm at bed at night. Pulling like sensation.    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     Past Surgical History:  Procedure Laterality Date   BIOPSY  10/07/2022   Procedure: BIOPSY;  Surgeon: Yetta Flock, MD;  Location: [place];  Service: Gastroenterology;;   COLONOSCOPY N/A 10/07/2022   Procedure: COLONOSCOPY;  Surgeon: Yetta Flock, MD;  Location: [facility] ENDOSCOPY;  Service: Gastroenterology;  Laterality: N/A;   COLONOSCOPY WITH PROPOFOL N/A 10/08/2022   Procedure: COLONOSCOPY WITH PROPOFOL;  Surgeon: Yetta Flock, MD;  Location: [place];  Service: Gastroenterology;  Laterality: N/A;   CORONARY ANGIOPLASTY WITH STENT PLACEMENT     ESOPHAGOGASTRODUODENOSCOPY (EGD) WITH PROPOFOL N/A 10/07/2022   Procedure: ESOPHAGOGASTRODUODENOSCOPY (EGD) WITH PROPOFOL;  Surgeon: Yetta Flock, MD;  Location: [place];  Service: Gastroenterology;  Laterality: N/A;   HOT HEMOSTASIS N/A 10/07/2022   Procedure: HOT HEMOSTASIS (ARGON PLASMA COAGULATION/BICAP);  Surgeon: Yetta Flock, MD;  Location[facility] ENDOSCOPY;  Service: Gastroenterology;  Laterality: N/A;   VENA CAVA FILTER PLACEMENT      Current Medications: Current Meds  Medication Sig   amoxicillin (AMOXIL) 500 MG capsule Take 2 capsules (1,000 mg total) by mouth every 12 (twelve) hours for 14 days.   aspirin EC 81 MG tablet Take 1 tablet (81 mg total) by mouth daily. Swallow whole.   atorvastatin (LIPITOR) 80 MG tablet Take 1 tablet (80 mg total) by mouth daily.   clarithromycin (BIAXIN) 500 MG tablet Take 1 tablet (500 mg total) by mouth every 12 (twelve) hours for 14 days.   clopidogrel (PLAVIX) 75 MG tablet Take 1 tablet (75 mg total) by mouth daily.   escitalopram (LEXAPRO) 10 MG tablet Take 1 tablet (10 mg total) by mouth daily.   metFORMIN (GLUCOPHAGE-XR) 500 MG 24 hr tablet Take 1 tablet (500 mg total) by mouth in the morning and at bedtime.   metoprolol succinate (TOPROL-XL) 25 MG 24 hr tablet Take 1 tablet (25 mg total) by mouth daily.   metroNIDAZOLE (FLAGYL) 500 MG tablet Take 1 tablet (500 mg total) by mouth every 12 (twelve) hours for 14 days.   pantoprazole (PROTONIX) 40 MG tablet Take  1 tablet (40 mg total) by mouth 2 (two) times daily.   sacubitril-valsartan (ENTRESTO) 24-26 MG Take 1 tablet by mouth 2 (two) times daily.   [DISCONTINUED] aspirin EC 325 MG tablet Take 1 tablet (325 mg total) by mouth daily.     Allergies:   Patient has no known allergies.   Social History   Socioeconomic History   Marital status: Legally Separated    Spouse name: Not on file   Number of children: Not on file   Years of education: Not on file   Highest education level: Not on file  Occupational History   Not on file  Tobacco Use   Smoking status: Never   Smokeless tobacco: Not on file  Substance and Sexual Activity   Alcohol  use: No   Drug use: No   Sexual activity: Not on file  Other Topics Concern   Not on file  Social History Narrative   Diet: Low Carb   Caffeine: 1 cup of coffee daily   Marital Status: Separated, married 1992   Lives in house, 1 stories, 3 persons, no pets   Current/Past profession: Scientist, clinical (histocompatibility and immunogenetics)    Exercise: No   Living Will: No    DNR- question Acelin Ferdig   POA/HPOA- No as of 09/06/14   Social Determinants of Health   Financial Resource Strain: Not on file  Food Insecurity: Not on file  Transportation Needs: Not on file  Physical Activity: Not on file  Stress: Not on file  Social Connections: Not on file     Family History: The patient's family history includes High blood pressure in his mother; Liver cancer in his father; Ovarian cancer in his mother.  ROS:   Please see the history of present illness.     All other systems reviewed and are negative.  EKGs/Labs/Other Studies Reviewed:    The following studies were reviewed today:  Cardiac Studies & Procedures       ECHOCARDIOGRAM  ECHOCARDIOGRAM COMPLETE 10/04/2022  Narrative ECHOCARDIOGRAM REPORT    Patient Name:   Mike [name] Date of Exam: 10/04/2022 Medical Rec #:  [id number]     Height:       66.0 in Accession #:    [id number]    Weight:       149.5 lb Date of Birth:  1956/03/14      BSA:          1.767 m Patient Age:    55 years      BP:           93/67 mmHg Patient Gender: M             HR:           101 bpm. Exam Location:  Inpatient  Procedure: 2D Echo, Color Doppler and Cardiac Doppler  Indications:    Stroke I63.9  History:        Patient has no prior history of Echocardiogram examinations. Stroke; Risk Factors:Hypertension and Current Smoker.  Sonographer:    Ronny Flurry Referring Phys: Craig [place]   1. Left ventricular ejection fraction, by estimation, is 35 to 40%. The left ventricle has moderately decreased function. The left ventricle demonstrates global  hypokinesis. Left ventricular diastolic parameters are indeterminate. 2. Right ventricular systolic function is hyperdynamic. The right ventricular size is normal. 3. The mitral valve is normal in structure. Trivial mitral valve regurgitation. No evidence of mitral stenosis. 4. The aortic valve was not well visualized. Aortic valve regurgitation  is trivial. No aortic stenosis is present. 5. The inferior vena cava is normal in size with greater than 50% respiratory variability, suggesting right atrial pressure of 3 mmHg.  FINDINGS Left Ventricle: Left ventricular ejection fraction, by estimation, is 35 to 40%. The left ventricle has moderately decreased function. The left ventricle demonstrates global hypokinesis. The left ventricular internal cavity size was normal in size. There is no left ventricular hypertrophy. Left ventricular diastolic parameters are indeterminate.  Right Ventricle: The right ventricular size is normal. Right vetricular wall thickness was not well visualized. Right ventricular systolic function is hyperdynamic.  Left Atrium: Left atrial size was normal in size.  Right Atrium: Right atrial size was normal in size.  Pericardium: Trivial pericardial effusion is present.  Mitral Valve: The mitral valve is normal in structure. Trivial mitral valve regurgitation. No evidence of mitral valve stenosis.  Tricuspid Valve: The tricuspid valve is normal in structure. Tricuspid valve regurgitation is trivial. No evidence of tricuspid stenosis.  Aortic Valve: The aortic valve was not well visualized. Aortic valve regurgitation is trivial. No aortic stenosis is present. Aortic valve mean gradient measures 3.7 mmHg. Aortic valve peak gradient measures 6.3 mmHg. Aortic valve area, by VTI measures 2.83 cm.  Pulmonic Valve: The pulmonic valve was not well visualized. Pulmonic valve regurgitation is not visualized.  Aorta: The aortic root is normal in size and structure.  Venous: The  inferior vena cava is normal in size with greater than 50% respiratory variability, suggesting right atrial pressure of 3 mmHg.  IAS/Shunts: No atrial level shunt detected by color flow Doppler.   LEFT VENTRICLE PLAX 2D LVIDd:         4.60 cm   Diastology LVIDs:         3.70 cm   LV e' medial:    4.46 cm/s LV PW:         0.90 cm   LV E/e' medial:  12.2 LV IVS:        1.20 cm   LV e' lateral:   9.17 cm/s LVOT diam:     2.10 cm   LV E/e' lateral: 5.9 LV SV:         47 LV SV Index:   26 LVOT Area:     3.46 cm   RIGHT VENTRICLE             IVC RV S prime:     12.30 cm/s  IVC diam: 1.60 cm TAPSE (M-mode): 2.0 cm  LEFT ATRIUM             Index        RIGHT ATRIUM          Index LA diam:        3.30 cm 1.87 cm/m   RA Area:     9.13 cm LA Vol (A2C):   28.3 ml 16.02 ml/m  RA Volume:   17.50 ml 9.90 ml/m LA Vol (A4C):   35.2 ml 19.92 ml/m LA Biplane Vol: 34.9 ml 19.75 ml/m AORTIC VALVE AV Area (Vmax):    2.71 cm AV Area (Vmean):   2.74 cm AV Area (VTI):     2.83 cm AV Vmax:           125.33 cm/s AV Vmean:          84.833 cm/s AV VTI:            0.165 m AV Peak Grad:      6.3 mmHg AV Mean Grad:  3.7 mmHg LVOT Vmax:         98.17 cm/s LVOT Vmean:        67.100 cm/s LVOT VTI:          0.134 m LVOT/AV VTI ratio: 0.82  AORTA Ao Root diam: 3.90 cm  MITRAL VALVE MV Area (PHT): 3.77 cm    SHUNTS MV Decel Time: 201 msec    Systemic VTI:  0.13 m MV E velocity: 54.20 cm/s  Systemic Diam: 2.10 cm MV A velocity: 98.50 cm/s MV E/A ratio:  0.55  Rudean Haskell MD Electronically signed by Rudean Haskell MD Signature Date/Time: 10/04/2022/2:56:35 PM    Final             Echocardiogram 06/17/2022: 1. Global hypokinesis worse in inferior base. Left ventricular ejection  fraction, by estimation, is 30 to 35%. The left ventricle has moderately  decreased function. The left ventricle demonstrates global hypokinesis.  The left ventricular internal cavity    size was moderately dilated. Left ventricular diastolic parameters are  consistent with Grade I diastolic dysfunction (impaired relaxation).   2. Right ventricular systolic function is normal. The right ventricular  size is normal.   3. The mitral valve is abnormal. No evidence of mitral valve  regurgitation. No evidence of mitral stenosis.   4. The aortic valve is tricuspid. There is mild calcification of the  aortic valve. There is mild thickening of the aortic valve. Aortic valve  regurgitation is trivial. Aortic valve sclerosis is present, with no  evidence of aortic valve stenosis.   5. The inferior vena cava is normal in size with greater than 50%  respiratory variability, suggesting right atrial pressure of 3 mmHg.   EKG: 06/15/2022: sinus tachycardia 109 with nonspecific ST-T wave changes  Recent Labs: 10/03/2022: Magnesium 2.1; TSH 0.707 10/04/2022: ALT 22 10/09/2022: BUN 7; Creatinine, Ser 0.91; Potassium 3.7; Sodium 138 10/10/2022: Hemoglobin 8.8; Platelets 270  Recent Lipid Panel    Component Value Date/Time   CHOL 277 (H) 06/15/2022 1500   TRIG 135 06/15/2022 1500   HDL 31 (L) 06/15/2022 1500   CHOLHDL 8.9 06/15/2022 1500   VLDL 27 06/15/2022 1500   LDLCALC NOT CALCULATED 06/15/2022 1500   LDLDIRECT 218 (H) 06/16/2022 0904     Risk Assessment/Calculations:              Physical Exam:    VS:  BP 117/69 (BP Location: Left Arm, Patient Position: Sitting, Cuff Size: Normal)   Pulse 77   Ht '5\' 6"'$  (1.676 m)   Wt 139 lb (63 kg)   SpO2 98%   BMI 22.44 kg/m     Wt Readings from Last 3 Encounters:  10/16/22 139 lb (63 kg)  10/08/22 149 lb 7.6 oz (67.8 kg)  09/16/22 149 lb 6 oz (67.8 kg)     GEN:  Well nourished, well developed in no acute distress HEENT: Normal NECK: No JVD; No carotid bruits LYMPHATICS: No lymphadenopathy CARDIAC: RRR, no murmurs, rubs, gallops RESPIRATORY:  Clear to auscultation without rales, wheezing or rhonchi  ABDOMEN: Soft, non-tender,  non-distended MUSCULOSKELETAL:  No edema; No deformity  SKIN: Warm and dry NEUROLOGIC:  Alert and oriented x 3 PSYCHIATRIC:  Normal affect   ASSESSMENT:    1. Chronic systolic heart failure ([place])   2. Coronary artery disease involving native coronary artery of native heart without angina pectoris   3. Cerebrovascular accident (CVA) due to embolism of right posterior cerebral artery ([place])   4. Acute gastric ulcer with  hemorrhage     PLAN:    In order of problems listed above:  GI bleed on 10/04/2022 I will stop his aspirin 325 and change this to 81 mg.  I will also stop his Plavix 75 mg.  Chronic systolic heart failure -EF 30 to 35% on echocardiogram with inferior wall hypokinesis.  Prior myocardial infarction in 2014.  He had 3 stents placed back in 2014.  [place] he states. Unsure if this ejection fraction has been chronic.  Nonetheless he is feeling NYHA class I type symptoms.  No shortness of breath. - I will start Toprol 25 mg once a day - Stopped losartan 50 and start Entresto 24/26 twice daily.  Delene Loll has been on hold since hospitalization for GI bleed and low blood pressure. -  If Entresto too expensive, go back to losartan. - Repeat echocardiogram during GI bleed showed ejection fraction of 40%.  Coronary artery disease - Possible ischemic cardiomyopathy.  3 prior stents in [place] in the setting of MI in 2014.  Continue with aspirin 81 mg.  Lacunar stroke - Slowly getting better.  Since he had GI bleed, I will place him on aspirin 81 mg only.  Stopping his Plavix.  Hyperlipidemia - Continue with atorvastatin 80 mg once a day LDL goal less than 70, in fact less than 55 given stroke and prior MI.          Medication Adjustments/Labs and Tests Ordered: Current medicines are reviewed at length with the patient today.  Concerns regarding medicines are outlined above.  No orders of the defined types were placed in this encounter.  Meds ordered this  encounter  Medications   sacubitril-valsartan (ENTRESTO) 24-26 MG    Sig: Take 1 tablet by mouth 2 (two) times daily.    Dispense:  180 tablet    Refill:  3   aspirin EC 81 MG tablet    Sig: Take 1 tablet (81 mg total) by mouth daily. Swallow whole.    Dispense:  30 tablet    Refill:  12    Patient Instructions  Medication Instructions:  Your physician has recommended you make the following change in your medication:   Stop taking Plavix Start taking aspirin 81 mg daily Start taking Entresto 24-26 mg 2 times daily *If you need a refill on your cardiac medications before your next appointment, please call your pharmacy*   Follow-Up: At [facility], you and your health needs are our priority.  As part of our continuing mission to provide you with exceptional heart care, we have created designated Provider Care Teams.  These Care Teams include your primary Cardiologist (physician) and Advanced Practice Providers (APPs -  Physician Assistants and Nurse Practitioners) who all work together to provide you with the care you need, when you need it.   Your next appointment:   3 month(s)  Provider:   APP     Signed, Candee Furbish, MD  10/16/2022 3:46 PM    [facility]

## 2022-10-16 NOTE — Patient Instructions (Signed)
Medication Instructions:  Your physician has recommended you make the following change in your medication:   Stop taking Plavix Start taking aspirin 81 mg daily Start taking Entresto 24-26 mg 2 times daily *If you need a refill on your cardiac medications before your next appointment, please call your pharmacy*   Follow-Up: At [facility], you and your health needs are our priority.  As part of our continuing mission to provide you with exceptional heart care, we have created designated Provider Care Teams.  These Care Teams include your primary Cardiologist (physician) and Advanced Practice Providers (APPs -  Physician Assistants and Nurse Practitioners) who all work together to provide you with the care you need, when you need it.   Your next appointment:   3 month(s)  Provider:   APP

## 2022-10-17 ENCOUNTER — Encounter: Payer: Self-pay | Admitting: Family Medicine

## 2022-10-17 ENCOUNTER — Ambulatory Visit (INDEPENDENT_AMBULATORY_CARE_PROVIDER_SITE_OTHER): Payer: Medicare HMO | Admitting: Family Medicine

## 2022-10-17 VITALS — BP 138/80 | HR 65 | Ht 66.0 in | Wt 139.2 lb

## 2022-10-17 DIAGNOSIS — Z09 Encounter for follow-up examination after completed treatment for conditions other than malignant neoplasm: Secondary | ICD-10-CM | POA: Diagnosis not present

## 2022-10-17 DIAGNOSIS — K5900 Constipation, unspecified: Secondary | ICD-10-CM

## 2022-10-17 DIAGNOSIS — L219 Seborrheic dermatitis, unspecified: Secondary | ICD-10-CM

## 2022-10-17 DIAGNOSIS — K250 Acute gastric ulcer with hemorrhage: Secondary | ICD-10-CM | POA: Diagnosis not present

## 2022-10-17 DIAGNOSIS — A048 Other specified bacterial intestinal infections: Secondary | ICD-10-CM

## 2022-10-17 DIAGNOSIS — I701 Atherosclerosis of renal artery: Secondary | ICD-10-CM

## 2022-10-17 MED ORDER — CLOTRIMAZOLE 1 % EX CREA: 1.0000 | TOPICAL_CREAM | Freq: Two times a day (BID) | CUTANEOUS | 0 refills | Status: AC

## 2022-10-17 MED ORDER — POLYETHYLENE GLYCOL 3350 17 GM/SCOOP PO POWD: 17.0000 g | Freq: Every day | ORAL | 0 refills | Status: AC

## 2022-10-17 NOTE — Assessment & Plan Note (Signed)
Affecting the face along the beard, posterior to the ears, nasal folds, and single patch on the scalp.  Not pruritic or bothersome.  Will treat with antifungal cream.

## 2022-10-17 NOTE — Assessment & Plan Note (Signed)
Doing well overall posthospitalization.  No signs of further GI bleed.  Will repeat CBC today.

## 2022-10-17 NOTE — Progress Notes (Signed)
SUBJECTIVE:   CHIEF COMPLAINT / HPI:  Chief Complaint  Patient presents with   [facility] f/u    Patient was admitted from 2/29 to 3/7 for GI bleed.  He required 2 units of red blood cell transfusion.  Underwent EGD and colonoscopy showing large gastric ulcer and positive for H. pylori.  Hemoglobin on discharge 8.8.  Resumed on his aspirin and clopidogrel prior to discharge due to recent CVA.  Noted to have renal artery stenosis on CTA during admission.  Started on escitalopram on 3/2 for depression.  Home antihypertensives were held but he was resumed on metoprolol succinate.  He saw his cardiologist Dr. Marlou Porch yesterday, started on metoprolol succinate 25 mg daily and Entresto 24/26 mg.  Clopidogrel was discontinued, aspirin was changed to 81 mg daily.  PCP Follow Up:  Depression, patient started on Lexapro 3/2 while hospitalized. Please titrate as indicated.  Repeat CBC to monitor Hgb 1 week after discharge, patient was restarted on aspirin and Plavix prior to discharge. Patient undergoing treatment for H Pylori, follow up for medication compliance. Please follow-up on blood pressure.  Patient was restarted on Toprol prior to discharge please monitor pressures and add back Entresto as indicated.  He reports he had some dizziness the day after discharge but this is now resolved. Denies abdominal pain, blood in stool, melena.  However, he has not had a BM since he was discharged. Still having some nausea when eating but getting better, has been able to eat more than before. He reports he has not had a BM for 1 week, this was a problem before hospitalization for months (sometimes longer than 1 week between BM). Has been having hard stools. Still taking antibiotics for H. Pylori, not sure how many days he has left.  He is taking the escitalopram but is not sure why. Reports no side effects.  PERTINENT  PMH / PSH: CVA, CAD, HFrEF, HLD, HTN  Patient Care Team: [name], DO as PCP -  General (Family Medicine) Jerline Pain, MD as PCP - Cardiology (Cardiology)   OBJECTIVE:   BP 138/80   Pulse 65   Ht '5\' 6"'$  (1.676 m)   Wt 139 lb 3.2 oz (63.1 kg)   SpO2 100%   BMI 22.47 kg/m   Physical Exam Constitutional:      General: He is not in acute distress. HENT:     Head: Normocephalic and atraumatic.  Cardiovascular:     Rate and Rhythm: Normal rate and regular rhythm.  Pulmonary:     Effort: Pulmonary effort is normal. No respiratory distress.     Breath sounds: Normal breath sounds.  Abdominal:     General: Bowel sounds are normal.     Palpations: Abdomen is soft.     Tenderness: There is no abdominal tenderness.  Musculoskeletal:     Cervical back: Neck supple.     Right lower leg: No edema.     Left lower leg: No edema.  Skin:    Comments: Scaly nonerythematous patches affecting the face along the beard, nasal folds, ear creases, and single patch on the scalp.  Neurological:     Mental Status: He is alert.            10/17/2022    9:06 AM  Depression screen PHQ 2/9  Decreased Interest 0  Down, Depressed, Hopeless 0  PHQ - 2 Score 0  Altered sleeping 1  Tired, decreased energy 0  Change in appetite 1  Feeling bad  or failure about yourself  0  Trouble concentrating 0  Moving slowly or fidgety/restless 0  Suicidal thoughts 0  PHQ-9 Score 2  Difficult doing work/chores Not difficult at all     {Show previous vital signs (optional):23777}    ASSESSMENT/PLAN:   Problem List Items Addressed This Visit       Cardiovascular and Mediastinum   Acute gastric ulcer with hemorrhage    Doing well overall posthospitalization.  No signs of further GI bleed.  Will repeat CBC today.      Relevant Orders   CBC   Right renal artery stenosis ([place])    Noted on imaging during hospitalization.  Continuing ARB should be okay since he has patent renal arteries on both sides.        Musculoskeletal and Integument   Seborrheic dermatitis    Affecting  the face along the beard, posterior to the ears, nasal folds, and single patch on the scalp.  Not pruritic or bothersome.  Will treat with antifungal cream.      Relevant Medications   clotrimazole (CLOTRIMAZOLE ANTI-FUNGAL) 1 % cream     Other   Constipation    Chronic problem, infrequent and hard stools.  No clinical signs of obstruction.  Treat with stool softener.      Relevant Medications   polyethylene glycol powder (GLYCOLAX/MIRALAX) 17 GM/SCOOP powder   H. pylori infection    Noted on EGD.  Adherent with antibiotics.  He has follow-up with GI next month.      Relevant Medications   clotrimazole (CLOTRIMAZOLE ANTI-FUNGAL) 1 % cream   Other Visit Diagnoses     Hospital discharge follow-up    -  Primary          Return in about 2 months (around 12/17/2022) for f/u diabetes.   Zola Button, MD [place]

## 2022-10-17 NOTE — Assessment & Plan Note (Signed)
Chronic problem, infrequent and hard stools.  No clinical signs of obstruction.  Treat with stool softener.

## 2022-10-17 NOTE — Assessment & Plan Note (Signed)
Noted on imaging during hospitalization.  Continuing ARB should be okay since he has patent renal arteries on both sides.

## 2022-10-17 NOTE — Assessment & Plan Note (Signed)
Noted on EGD.  Adherent with antibiotics.  He has follow-up with GI next month.

## 2022-10-17 NOTE — Patient Instructions (Addendum)
It was nice seeing you today!  Take Miralax 1-2 times a day to help soften your stools.  Use clotrimazole cream twice a day to affected areas on face and scalp. You can use this for up to 4 weeks.  Stay well, Zola Button, MD [place] [phone]  --  Make sure to check out at the front desk before you leave today.  Please arrive at least 15 minutes prior to your scheduled appointments.  If you had blood work today, I will send you a MyChart message or a letter if results are normal. Otherwise, I will give you a call.  If you had a referral placed, they will call you to set up an appointment. Please give [place] a call if you don't hear back in the next 2 weeks.  If you need additional refills before your next appointment, please call your pharmacy first.

## 2022-10-18 LAB — CBC
Hematocrit: 33.6 % — ABNORMAL LOW (ref 37.5–51.0)
Hemoglobin: 11.1 g/dL — ABNORMAL LOW (ref 13.0–17.7)
MCH: 30.5 pg (ref 26.6–33.0)
MCHC: 33 g/dL (ref 31.5–35.7)
MCV: 92 fL (ref 79–97)
Platelets: 372 10*3/uL (ref 150–450)
RBC: 3.64 x10E6/uL — ABNORMAL LOW (ref 4.14–5.80)
RDW: 15.2 % (ref 11.6–15.4)
WBC: 6 10*3/uL (ref 3.4–10.8)

## 2022-10-24 ENCOUNTER — Other Ambulatory Visit: Payer: Self-pay | Admitting: Family Medicine

## 2022-10-24 DIAGNOSIS — E1122 Type 2 diabetes mellitus with diabetic chronic kidney disease: Secondary | ICD-10-CM

## 2022-10-24 DIAGNOSIS — I129 Hypertensive chronic kidney disease with stage 1 through stage 4 chronic kidney disease, or unspecified chronic kidney disease: Secondary | ICD-10-CM

## 2022-11-28 ENCOUNTER — Ambulatory Visit (INDEPENDENT_AMBULATORY_CARE_PROVIDER_SITE_OTHER): Payer: Medicare Other | Admitting: Family Medicine

## 2022-11-28 ENCOUNTER — Encounter: Payer: Self-pay | Admitting: Family Medicine

## 2022-11-28 VITALS — BP 108/73 | HR 67 | Ht 66.0 in | Wt 142.0 lb

## 2022-11-28 DIAGNOSIS — E1169 Type 2 diabetes mellitus with other specified complication: Secondary | ICD-10-CM | POA: Diagnosis not present

## 2022-11-28 DIAGNOSIS — E1122 Type 2 diabetes mellitus with diabetic chronic kidney disease: Secondary | ICD-10-CM

## 2022-11-28 DIAGNOSIS — G5602 Carpal tunnel syndrome, left upper limb: Secondary | ICD-10-CM

## 2022-11-28 DIAGNOSIS — I639 Cerebral infarction, unspecified: Secondary | ICD-10-CM

## 2022-11-28 DIAGNOSIS — K250 Acute gastric ulcer with hemorrhage: Secondary | ICD-10-CM | POA: Diagnosis not present

## 2022-11-28 DIAGNOSIS — I10 Essential (primary) hypertension: Secondary | ICD-10-CM

## 2022-11-28 DIAGNOSIS — F339 Major depressive disorder, recurrent, unspecified: Secondary | ICD-10-CM

## 2022-11-28 DIAGNOSIS — N181 Chronic kidney disease, stage 1: Secondary | ICD-10-CM

## 2022-11-28 DIAGNOSIS — I129 Hypertensive chronic kidney disease with stage 1 through stage 4 chronic kidney disease, or unspecified chronic kidney disease: Secondary | ICD-10-CM

## 2022-11-28 DIAGNOSIS — E785 Hyperlipidemia, unspecified: Secondary | ICD-10-CM

## 2022-11-28 MED ORDER — METFORMIN HCL ER 500 MG PO TB24: ORAL_TABLET | ORAL | 0 refills | Status: DC

## 2022-11-28 MED ORDER — METOPROLOL SUCCINATE ER 25 MG PO TB24: 25.0000 mg | ORAL_TABLET | Freq: Every day | ORAL | 3 refills | Status: DC

## 2022-11-28 MED ORDER — ASPIRIN 81 MG PO TBEC: 81.0000 mg | DELAYED_RELEASE_TABLET | Freq: Every day | ORAL | 12 refills | Status: DC

## 2022-11-28 MED ORDER — PANTOPRAZOLE SODIUM 40 MG PO TBEC: 40.0000 mg | DELAYED_RELEASE_TABLET | Freq: Two times a day (BID) | ORAL | 0 refills | Status: DC

## 2022-11-28 MED ORDER — ATORVASTATIN CALCIUM 80 MG PO TABS: 80.0000 mg | ORAL_TABLET | Freq: Every day | ORAL | 1 refills | Status: DC

## 2022-11-28 MED ORDER — ESCITALOPRAM OXALATE 10 MG PO TABS: 10.0000 mg | ORAL_TABLET | Freq: Every day | ORAL | 0 refills | Status: DC

## 2022-11-28 NOTE — Assessment & Plan Note (Signed)
BP was 108/73 today and has been WNL when he checks BP at home. HTN is well controlled. Continue current regimen and follow up 2 months.

## 2022-11-28 NOTE — Assessment & Plan Note (Addendum)
Patient had an H. Pylori positive gastric ulcer on 10/07/22. He can completed his course on triple therapy. He has had no bloody or dark stools. His energy has improved and his iron studies were improving on labs at last appt. Patient has no signs of continued bleeding. He is seeing gastroenterology tomorrow for follow up.

## 2022-11-28 NOTE — Progress Notes (Deleted)
      SUBJECTIVE:   CHIEF COMPLAINT / HPI:   Mr. Mike [name] is a 67 yo male who presents to clinic for follow up on HTN and DM2. His other concerns include morning L hand numbness.  HTN Patient is compliant with his medications and describes no adverse effects to meds. He checks his BP at home and it is WNL. Denies headache, N/V, or visual changes.  DM2 Patient is taking metformin and has no problems with medications. He checks his blood sugar at home and it is around 120-130 in the mornings. He denies feeling lightheaded, dizzy, or faint.  L Hand Numbness Mr. Mike [name] says that he has been waking up for the past 2 months with left hand numbness several times per week that improves after moving or shaking his hand. He types a lot at work and says he has had carpal tunnel before but is not sure if this carpal tunnel because his whole hand goes numb and sometimes twitches.  PERTINENT  PMH / PSH: HTN, DM2, gastric ulcer with H. Pylori (10/07/22), stroke (06/15/22), depressed mood  OBJECTIVE:   BP 108/73   Pulse 67   Ht  (1.676 m)   Wt 142 lb (64.4 kg)   SpO2 100%   BMI 22.92 kg/m    Gen: alert, well appearing, in no acute distress HEENT: normocephalic, atraumatic CV: RRR, no MRG Pulm: normal WOB, clear to auscultation bilaterally Ab: normoactive bowel sounds, no tenderness or masses Ext: no lower extremity edema MSK: negative tinel test Neuro: normal strength and sensation bilaterally  ASSESSMENT/PLAN:   Acute gastric ulcer with hemorrhage Patient had an H. Pylori positive gastric ulcer on 10/07/22. He can completed his course on triple therapy. He has had no bloody or dark stools. His energy has improved and his iron studies were improving on labs at last appt. Patient has no signs of continued bleeding. He is seeing gastroenterology tomorrow for follow up.  Hypertension BP was 108/73 today and has been WNL when he checks BP at home. HTN is well controlled. Continue current  regimen and follow up 2 months.  Diabetes (HCC) Patient is taking metformin 500 mg bid. His A1c was 7.3 2 months ago. Blood sugars are around 120-130 in the mornings. DM2 is well controlled, continue current regimen and f/u in 2 months.  Depression, recurrent (HCC) Patient says his mood has improved since he started lexapro . Offered therapy but he says he would rather just try medications for now. Reevaluate at next visit in 2 months.  Carpal tunnel syndrome of left wrist Patient describes L hand numbness that only occurs in the mornings and improves when moving/shaking hand. His job involves lots of typing and he says he has had carpal tunnel before. Advises patient to try wrist braces to see if symptoms improve.     Barrett Shell, Medical Student [facility]

## 2022-11-28 NOTE — Assessment & Plan Note (Signed)
Patient says his mood has improved since he started lexapro . Offered therapy but he says he would rather just try medications for now. Reevaluate at next visit in 2 months.

## 2022-11-28 NOTE — Assessment & Plan Note (Signed)
Patient describes L hand numbness that only occurs in the mornings and improves when moving/shaking hand. His job involves lots of typing and he says he has had carpal tunnel before. Advises patient to try wrist braces to see if symptoms improve.

## 2022-11-28 NOTE — Patient Instructions (Addendum)
It was great seeing you today!  Today we discussed many things. Your blood pressure looks great, please continue to take all your medications as prescribed.  We will recheck another A1c at your next visit.  Regarding your wrist pain, this seems to be due to carpal tunnel syndrome which can occur with repetitive use. Please use a wrist splint and wear this during the day, especially when you are typing. You can get this at most pharmacies.   Please follow up at your next scheduled appointment in 2 months, if anything arises between now and then, please don't hesitate to contact our office.   Thank you for allowing [place] to be a part of your medical care!  Thank you, Dr. Robyne Peers

## 2022-11-28 NOTE — Progress Notes (Signed)
SUBJECTIVE:   CHIEF COMPLAINT / HPI:   Mr. Mike [name] is a 67 yo male who presents to clinic for follow up on HTN and DM2. His other concerns include morning L hand numbness.  HTN [name] is compliant with his medications and describes no adverse effects to meds. He checks his BP at home and it is WNL. Denies headache, N/V, or visual changes.  DM2 [name] is taking metformin and has no problems with medications. He checks his blood sugar at home and it is around 120-130 in the mornings. He denies feeling lightheaded, dizzy, or faint.  L Hand Numbness Mr. Kidney says that he has been waking up for the past 2 months with left hand numbness several times per week that improves after moving or shaking his hand. He types a lot at work and says he has had carpal tunnel before but is not sure if this carpal tunnel because his whole hand goes numb and sometimes twitches.  PERTINENT  PMH / PSH: HTN, DM2, gastric ulcer with H. Pylori (10/07/22), stroke (06/15/22), depressed mood  OBJECTIVE:   BP 108/73   Pulse 67   Ht  (1.676 m)   Wt 142 lb (64.4 kg)   SpO2 100%   BMI 22.92 kg/m    Gen: alert, well appearing, in no acute distress HEENT: normocephalic, atraumatic CV: RRR, no MRG Pulm: normal WOB, clear to auscultation bilaterally Ab: normoactive bowel sounds, no tenderness or masses Ext: no lower extremity edema MSK: negative tinel test, positive flick sign  Neuro: 5/5 UE and interosseous strength, sensation intact bilaterally, normal gait   ASSESSMENT/PLAN:   Acute gastric ulcer with hemorrhage [name] had an H. Pylori positive gastric ulcer on 10/07/22. He can completed his course on triple therapy. He has had no bloody or dark stools. His energy has improved and his iron studies were improving on labs at last appt. [name] has no signs of continued bleeding. He is seeing gastroenterology tomorrow for follow up.  Hypertension BP was 108/73 today and has been WNL when he  checks BP at home. HTN is well controlled. Continue current regimen and follow up 2 months.  Diabetes (HCC) [name] is taking metformin 500 mg bid. His A1c was 7.3 2 months ago. Blood sugars are around 120-130 in the mornings. DM2 is well controlled, continue current regimen and f/u in 2 months for repeat A1c.  Depression, recurrent (HCC) [name] says his mood has improved since he started lexapro . Offered therapy but he says he would rather just try medications for now. Reevaluate at next visit in 2 months.  Carpal tunnel syndrome of left wrist [name] describes L hand numbness that only occurs in the mornings and improves when moving/shaking hand. His job involves lots of typing and he says he has had carpal tunnel before. Advises [name] to try wrist braces to see if symptoms improve.   -PHQ-9 score of 0 reviewed.  -Med rec reviewed and updated appropriately, appropriate refills provided    Mike [name], Medical Student [facility]   I was personally present and performed or re-performed the history, physical exam and medical decision making activities of this service and have verified that the service and findings are accurately documented in the student's note. My edits are noted within the note above. Please also see attending's attestation.   Mike Leader, DO                  11/28/2022, 3:55 PM

## 2022-11-28 NOTE — Assessment & Plan Note (Addendum)
Patient is taking metformin 500 mg bid. His A1c was 7.3 2 months ago. Blood sugars are around 120-130 in the mornings. DM2 is well controlled, continue current regimen and f/u in 2 months for repeat A1c.

## 2022-11-29 ENCOUNTER — Encounter: Payer: Self-pay | Admitting: Internal Medicine

## 2022-11-29 ENCOUNTER — Ambulatory Visit: Payer: Medicare Other | Admitting: Internal Medicine

## 2022-11-29 VITALS — BP 116/60 | HR 72 | Ht 65.0 in | Wt 143.0 lb

## 2022-11-29 DIAGNOSIS — Z1211 Encounter for screening for malignant neoplasm of colon: Secondary | ICD-10-CM | POA: Diagnosis not present

## 2022-11-29 DIAGNOSIS — Z8711 Personal history of peptic ulcer disease: Secondary | ICD-10-CM | POA: Diagnosis not present

## 2022-11-29 DIAGNOSIS — Z8619 Personal history of other infectious and parasitic diseases: Secondary | ICD-10-CM | POA: Diagnosis not present

## 2022-11-29 DIAGNOSIS — K5900 Constipation, unspecified: Secondary | ICD-10-CM

## 2022-11-29 DIAGNOSIS — D500 Iron deficiency anemia secondary to blood loss (chronic): Secondary | ICD-10-CM

## 2022-11-29 MED ORDER — NA SULFATE-K SULFATE-MG SULF 17.5-3.13-1.6 GM/177ML PO SOLN: ORAL | 0 refills | Status: AC

## 2022-11-29 NOTE — Progress Notes (Signed)
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 Since discharge, he completed the full course of H pylori treatment. He has been doing well. Denies hematochezia or melena. His PCP checked his blood counts recently and noted that they had been increasing. Denies ab pain, N&V, dysphagia, acid reflux, or diarrhea. He did not poop for 2 weeks after his hospitalization. He started taking Miralax and he is now going to the bathroom to have a BM once a week. He is no longer taking the Plavix as instructed by his cardiologist Dr. Anne Fu. Denies family history of GI cancers.   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      Past Surgical History:  Procedure Laterality Date   BIOPSY  10/07/2022   Procedure: BIOPSY;  Surgeon: Benancio Deeds, MD;  Location[facility] ENDOSCOPY;  Service: Gastroenterology;;   COLONOSCOPY N/A 10/07/2022   Procedure: COLONOSCOPY;  Surgeon: Benancio Deeds, MD;  Location: [facility] ENDOSCOPY;  Service: Gastroenterology;  Laterality: N/A;   COLONOSCOPY WITH PROPOFOL N/A 10/08/2022   Procedure: COLONOSCOPY WITH PROPOFOL;  Surgeon: Benancio Deeds, MD;  Location[facility] ENDOSCOPY;  Service: Gastroenterology;  Laterality: N/A;   CORONARY ANGIOPLASTY WITH STENT PLACEMENT     ESOPHAGOGASTRODUODENOSCOPY (EGD) WITH PROPOFOL N/A 10/07/2022   Procedure: ESOPHAGOGASTRODUODENOSCOPY (EGD) WITH PROPOFOL;  Surgeon: Benancio Deeds, MD;   Location: [facility] ENDOSCOPY;  Service: Gastroenterology;  Laterality: N/A;   HOT HEMOSTASIS N/A 10/07/2022   Procedure: HOT HEMOSTASIS (ARGON PLASMA COAGULATION/BICAP);  Surgeon: Benancio Deeds, MD;  Location: [facility] ENDOSCOPY;  Service: Gastroenterology;  Laterality: N/A;   VENA CAVA FILTER PLACEMENT     Family History  Problem Relation Age of Onset   Liver cancer Father    High blood pressure Mother    Ovarian cancer Mother    Social History   Tobacco Use   Smoking status: Never  Substance Use Topics   Alcohol use: No   Drug use: No   Current Outpatient Medications  Medication Sig Dispense Refill   aspirin EC 81 MG tablet Take 1 tablet (81 mg total) by mouth daily. Swallow whole. 30 tablet 12   atorvastatin (LIPITOR) 80 MG tablet Take 1 tablet (80 mg total) by mouth daily. 90 tablet 1   clotrimazole (CLOTRIMAZOLE ANTI-FUNGAL) 1 % cream Apply 1 Application topically 2 (two) times daily. 60 g 0   escitalopram (LEXAPRO) 10 MG tablet Take 1 tablet (10 mg total) by mouth daily. 30 tablet 0   metFORMIN (GLUCOPHAGE-XR) 500 MG 24 hr tablet TAKE 1 TABLET BY MOUTH IN THE MORNING AND AT BEDTIME 90 tablet 0   metoprolol succinate (TOPROL-XL) 25 MG 24 hr tablet Take 1 tablet (25 mg total) by mouth daily. 90 tablet 3   pantoprazole (PROTONIX) 40 MG tablet Take 1 tablet (40 mg total) by mouth 2 (two)  times daily. 60 tablet 0   polyethylene glycol powder (GLYCOLAX/MIRALAX) 17 GM/SCOOP powder Take 17 g by mouth daily. 500 g 0   sacubitril-valsartan (ENTRESTO) 24-26 MG Take 1 tablet by mouth 2 (two) times daily. 180 tablet 3   No current facility-administered medications for this visit.   No Known Allergies   Review of Systems: All systems reviewed and negative except where noted in HPI.   Physical Exam: BP 116/60 (BP Location: Left Arm, Patient Position: Sitting, Cuff Size: Normal)   Pulse 72   Ht 5\' 5"  (1.651 m)   Wt 143 lb (64.9 kg)   BMI 23.80 kg/m  Constitutional:  Pleasant,well-developed, male in no acute distress. HEENT: Normocephalic and atraumatic. Conjunctivae are normal. No scleral icterus. Cardiovascular: Normal rate, regular rhythm.  Pulmonary/chest: Effort normal and breath sounds normal. No wheezing, rales or rhonchi. Abdominal: Soft, nondistended, nontender. Bowel sounds active throughout. There are no masses palpable. No hepatomegaly. Extremities: No edema Neurological: Alert and oriented to person place and time. Skin: Skin is warm and dry. No rashes noted. Psychiatric: Normal mood and affect. Behavior is normal.  Labs 10/2022: CBC with Hb 11.1. Plts nml. BMP unremarkable  CT A/P w/contrast 10/04/22: IMPRESSION: 1. 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 Nephrology consult and further evaluation with conventional angiography is recommended. 2. Thrombus and suspected hemodynamically significant stenosis within the midportion of the superior mesenteric artery. 3. Hepatic steatosis. 4. Enlarged prostate gland. 5. Aortic atherosclerosis  CTA A/P w/contrast 10/04/22: IMPRESSION: VASCULAR 1. Fibrofatty, wall adherent atherosclerotic plaque about the proximal superior mesenteric artery measuring up to approximately 2.5 cm in resulting in moderate stenosis. The celiac and inferior mesenteric arteries are patent. 2. Severe ostial stenosis of 2 of the 3 right renal arteries. 3.  Aortic Atherosclerosis (ICD10-I70.0). NON-VASCULAR 1. Mild diffuse renal cortical atrophy about the right kidney, likely secondary to vascular impression number 1 above. 2. No acute abdominopelvic abnormality.  EGD 10/07/22:   Colonoscopy 10/07/22:    Colonoscopy 10/08/22:   ASSESSMENT AND PLAN: History of gastric ulcer History of H pylori Histroy of gastric AVM s/p APC Colon cancer screening Constipation Anemia Patient was hospitalized for significant anemia and on EGD was found to have H pylori-induced  gastric ulcer as well as a gastric AVM that was treated with APC. He completed his course of therapy for H pylori. Recent blood counts suggest that his anemia is improving. Will plan for an EGD to see if his previously visualized gastric ulcer has healed and to evaluate for H pylori eradication. At the same time, will plan for a colonoscopy for colon cancer screening. Despite two attempts at recent inpatient colonoscopies, his prep was not adequate for colon cancer screening. Patient seems to have significant underlying constipation so will have him do a two day bowel prep as well as some Linzess therapy before his colonoscopy. - EGD/colonoscopy [facility] with 2 day prep. He will take Linzess 290 mcg QD for 2 weeks before his procedure.   Eulah Pont, MD  I spent 45 minutes of time, including in depth chart review, independent review of results as outlined above, communicating results with the patient directly, face-to-face time with the patient, coordinating care, ordering studies and medications as appropriate, and documentation.

## 2022-11-29 NOTE — Patient Instructions (Addendum)
You have been scheduled for an endoscopy and colonoscopy. Please follow the written instructions given to you at your visit today. Please pick up your prep supplies at the pharmacy within the next 1-3 days.  If you use inhalers (even only as needed), please bring them with you on the day of your procedure.   We have sent the following medications to your pharmacy for you to pick up at your convenience: Suprep  _______________________________________________________  If your blood pressure at your visit was 140/90 or greater, please contact your primary care physician to follow up on this.  _______________________________________________________  If you are age 70 or older, your body mass index should be between 23-30. Your Body mass index is 23.8 kg/m. If this is out of the aforementioned range listed, please consider follow up with your Primary Care Provider.  If you are age 7 or younger, your body mass index should be between 19-25. Your Body mass index is 23.8 kg/m. If this is out of the aformentioned range listed, please consider follow up with your Primary Care Provider.   ________________________________________________________  The [facility] providers would like to encourage you to use [facility] to communicate with providers for non-urgent requests or questions.  Due to long hold times on the telephone, sending your provider a message by [facility] may be a faster and more efficient way to get a response.  Please allow 48 business hours for a response.  Please remember that this is for non-urgent requests.  _______________________________________________________   Due to recent changes in healthcare laws, you may see the results of your imaging and laboratory studies on MyChart before your provider has had a chance to review them.  We understand that in some cases there may be results that are confusing or concerning to you. Not all laboratory results come back in the same time frame and  the provider may be waiting for multiple results in order to interpret others.  Please give [place] 48 hours in order for your provider to thoroughly review all the results before contacting the office for clarification of your results.    Thank you for entrusting me with your care and for choosing [facility], Dr. Eulah Pont

## 2023-01-06 ENCOUNTER — Encounter: Payer: Medicare Other | Admitting: Internal Medicine

## 2023-01-21 ENCOUNTER — Ambulatory Visit: Payer: Medicare Other | Attending: Physician Assistant | Admitting: Physician Assistant

## 2023-01-21 ENCOUNTER — Encounter: Payer: Self-pay | Admitting: Physician Assistant

## 2023-01-21 VITALS — BP 134/88 | HR 84 | Ht 65.0 in | Wt 149.6 lb

## 2023-01-21 DIAGNOSIS — I5020 Unspecified systolic (congestive) heart failure: Secondary | ICD-10-CM | POA: Diagnosis not present

## 2023-01-21 DIAGNOSIS — I639 Cerebral infarction, unspecified: Secondary | ICD-10-CM

## 2023-01-21 DIAGNOSIS — I2510 Atherosclerotic heart disease of native coronary artery without angina pectoris: Secondary | ICD-10-CM | POA: Diagnosis not present

## 2023-01-21 DIAGNOSIS — E785 Hyperlipidemia, unspecified: Secondary | ICD-10-CM

## 2023-01-21 DIAGNOSIS — E1169 Type 2 diabetes mellitus with other specified complication: Secondary | ICD-10-CM | POA: Diagnosis not present

## 2023-01-21 DIAGNOSIS — K250 Acute gastric ulcer with hemorrhage: Secondary | ICD-10-CM

## 2023-01-21 DIAGNOSIS — I10 Essential (primary) hypertension: Secondary | ICD-10-CM

## 2023-01-21 MED ORDER — PANTOPRAZOLE SODIUM 40 MG PO TBEC
40.0000 mg | DELAYED_RELEASE_TABLET | Freq: Two times a day (BID) | ORAL | 11 refills | Status: DC
Start: 2023-01-21 — End: 2023-07-18

## 2023-01-21 MED ORDER — ENTRESTO 24-26 MG PO TABS: 1.0000 | ORAL_TABLET | Freq: Two times a day (BID) | ORAL | 3 refills | Status: DC

## 2023-01-21 MED ORDER — METOPROLOL SUCCINATE ER 50 MG PO TB24: 50.0000 mg | ORAL_TABLET | Freq: Every day | ORAL | 3 refills | Status: DC

## 2023-01-21 MED ORDER — ATORVASTATIN CALCIUM 80 MG PO TABS
80.0000 mg | ORAL_TABLET | Freq: Every day | ORAL | 1 refills | Status: DC
Start: 2023-01-21 — End: 2024-02-03

## 2023-01-21 MED ORDER — ASPIRIN 81 MG PO TBEC
81.0000 mg | DELAYED_RELEASE_TABLET | Freq: Every day | ORAL | 12 refills | Status: AC
Start: 2023-01-21 — End: ?

## 2023-01-21 MED ORDER — SPIRONOLACTONE 25 MG PO TABS: 12.5000 mg | ORAL_TABLET | Freq: Every day | ORAL | 3 refills | Status: DC

## 2023-01-21 NOTE — Progress Notes (Addendum)
Cardiology Office Note:    Date:  01/21/2023  ID:  Mike [name], DOB October 03, 1955, MRN [id number] PCP: Reece Leader, DO  [facility] Providers Cardiologist:  Donato Schultz, MD       Patient Profile:      Coronary artery disease  S/p MI in 2014 s/p stent x 3 [facility], [place] - no records) (HFrEF) heart failure with reduced ejection fraction ?Ischemic CM  TTE 06/17/2022: EF 30-35, global HK, worse inferiorly TTE 10/04/2022: EF 35-40, global HK, normal RV size, trivial MR, trivial AI, RAP 3 Hyperlipidemia  Hypertension Diabetes mellitus   R brain CVA in 06/2022 Head/Neck CTA 06/2022: No significant carotid stenosis, Mod stenosis of R VA, 56% L subclavian  Hx of GI bleed 10/2022 >> s/p Txn 1u PRBCs; Clopidogrel DC'd Renal artery stenosis CT 10/2022: severe ostial stenosis of 2 of the 3 R RAs; mod SMA stenosis Aortic atherosclerosis Hx of R leg DVT s/p IVC filter (2014, at time of MI)      History of Present Illness:   Mike [name] is a 67 y.o. male returns for follow-up of CHF.  He was last seen by Dr. Anne Fu 10/16/2022.  His Mike [name] has been held due to low blood pressure in the setting of GI bleed.  At last visit, Entresto 24/26 mg twice daily was restarted.  He returns for further titration of GDMT.  Plan is to eventually repeat his echocardiogram once his GDMT is maximized.  He is here alone.  He has been doing well.  He has not had any side effects to [facility].  He has not had chest pain, shortness of breath, syncope, orthopnea, leg edema.  Review of Systems  Gastrointestinal:  Negative for hematochezia and melena.  Genitourinary:  Negative for hematuria.   See HPI    Studies Reviewed:        Risk Assessment/Calculations:             Physical Exam:   VS:  BP 134/88   Pulse 84   Ht 5\' 5"  (1.651 m)   Wt 149 lb 9.6 oz (67.9 kg)   SpO2 98%   BMI 24.89 kg/m    Wt Readings from Last 3 Encounters:  01/21/23 149 lb 9.6 oz (67.9 kg)  11/29/22 143 lb (64.9  kg)  11/28/22 142 lb (64.4 kg)    Constitutional:      Appearance: Healthy appearance. Not in distress.  Neck:     Vascular: JVD normal.  Pulmonary:     Breath sounds: Normal breath sounds. No wheezing. No rales.  Cardiovascular:     Normal rate. Regular rhythm.     Murmurs: There is no murmur.  Edema:    Peripheral edema absent.  Abdominal:     Palpations: Abdomen is soft.       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  EF 35-40 by echocardiogram March 2024.  NYHA II.  Volume status stable.  Continue Entresto 24/26 mg twice daily.  Obtain follow-up BMET today.  Heart rate and blood pressure could be better.  Increase metoprolol succinate to 50 mg daily.  Continue to titrate GDMT by adding spironolactone 12.5 mg daily.  Obtain follow-up CMET in 1 week, BMET 2 weeks.  Follow-up with me in 4 weeks.  Consider adding SGLT2 inhibitor (Jardiance) at that time.  Once GDMT titrated to max doses, repeat echocardiogram.  CAD (coronary artery disease) History of myocardial infarction in 2014  treated with stenting x 3.  No records available.  He is not having symptoms of angina.  Continue ASA 81 mg daily, Lipitor 80 mg daily.  I will try to obtain records from the hospital in [place], [place].  Essential hypertension Borderline elevated.  Increase metoprolol succinate to 50 mg daily as noted.  Add spironolactone 12.5 mg daily as noted.  Continue Entresto 24/26 mg twice daily.  Hyperlipidemia associated with type 2 diabetes mellitus (HCC) Goal LDL is <55.  Continue Lipitor 80 mg daily.  Obtain follow-up CMET, lipids 1 week.  ADDENDUM 01/24/2023  Records received from [facility] in [place], [place]. Pt had an anterior STEMI in 01/2013 tx with 3.5 x 18 mm BMS to the mLAD, 4 x 26 mm DES to the pRCA, 3 x 22 mm BMS to dRCA. Residual disease: mLCx 70-80. EF 50. Hosp stay c/b pseudoaneurysm and hematoma. 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 He had IVC filter placed.     Dispo:  Return in about 4 weeks (around 02/18/2023) for Routine Follow Up, w/ [name], PA-C.  Signed, [name], PA-C

## 2023-01-21 NOTE — Patient Instructions (Addendum)
Medication Instructions:  Your physician has recommended you make the following change in your medication:   INCREASE the Metoprolol to 50 mg taking 1 daily   START Spironolactone 25 mg taking 1/2 tablet daily   *If you need a refill on your cardiac medications before your next appointment, please call your pharmacy*   Lab Work: TODAY:  BMET  01/28/23: 1 WEEK:  CMET & LIPID  6/25 COME ANYTIME AFTER 7:15 A.M. MAKE SURE YOU ARE FASTING  02/04/23: 2 WEEKS:  BMET ANYTIME AFTER 7:15, YOU DON'T HAVE TO FAST  If you have labs (blood work) drawn today and your tests are completely normal, you will receive your results only by: MyChart Message (if you have MyChart) OR A paper copy in the mail If you have any lab test that is abnormal or we need to change your treatment, we will call you to review the results.   Testing/Procedures: None ordered   Follow-Up: At [facility], you and your health needs are our priority.  As part of our continuing mission to provide you with exceptional heart care, we have created designated Provider Care Teams.  These Care Teams include your primary Cardiologist (physician) and Advanced Practice Providers (APPs -  Physician Assistants and Nurse Practitioners) who all work together to provide you with the care you need, when you need it.  We recommend signing up for the patient portal called "MyChart".  Sign up information is provided on this After Visit Summary.  MyChart is used to connect with patients for Virtual Visits (Telemedicine).  Patients are able to view lab/test results, encounter notes, upcoming appointments, etc.  Non-urgent messages can be sent to your provider as well.   To learn more about what you can do with MyChart, go to [email or web address].    Your next appointment:   4-6 week(s)  02/18/23  ARRIVE AT 2:30  Provider:   Donato Schultz, MD  or [name], PA-C         Other Instructions

## 2023-01-21 NOTE — Assessment & Plan Note (Signed)
Borderline elevated.  Increase metoprolol succinate to 50 mg daily as noted.  Add spironolactone 12.5 mg daily as noted.  Continue Entresto 24/26 mg twice daily.

## 2023-01-21 NOTE — Assessment & Plan Note (Signed)
History of myocardial infarction in 2014 treated with stenting x 3.  No records available.  He is not having symptoms of angina.  Continue ASA 81 mg daily, Lipitor 80 mg daily.  I will try to obtain records from the hospital in [place], [place].

## 2023-01-21 NOTE — Assessment & Plan Note (Signed)
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  EF 35-40 by echocardiogram March 2024.  NYHA II.  Volume status stable.  Continue Entresto 24/26 mg twice daily.  Obtain follow-up BMET today.  Heart rate and blood pressure could be better.  Increase metoprolol succinate to 50 mg daily.  Continue to titrate GDMT by adding spironolactone 12.5 mg daily.  Obtain follow-up CMET in 1 week, BMET 2 weeks.  Follow-up with me in 4 weeks.  Consider adding SGLT2 inhibitor (Jardiance) at that time.  Once GDMT titrated to max doses, repeat echocardiogram.

## 2023-01-21 NOTE — Assessment & Plan Note (Signed)
Goal LDL is <55.  Continue Lipitor 80 mg daily.  Obtain follow-up CMET, lipids 1 week.

## 2023-01-22 LAB — BASIC METABOLIC PANEL
BUN/Creatinine Ratio: 16 (ref 10–24)
BUN: 15 mg/dL (ref 8–27)
CO2: 26 mmol/L (ref 20–29)
Calcium: 10.4 mg/dL — ABNORMAL HIGH (ref 8.6–10.2)
Chloride: 103 mmol/L (ref 96–106)
Creatinine, Ser: 0.91 mg/dL (ref 0.76–1.27)
Glucose: 128 mg/dL — ABNORMAL HIGH (ref 70–99)
Potassium: 4.6 mmol/L (ref 3.5–5.2)
Sodium: 142 mmol/L (ref 134–144)
eGFR: 94 mL/min/{1.73_m2} (ref >59)

## 2023-01-28 ENCOUNTER — Ambulatory Visit (INDEPENDENT_AMBULATORY_CARE_PROVIDER_SITE_OTHER): Payer: Medicare Other | Admitting: Family Medicine

## 2023-01-28 ENCOUNTER — Ambulatory Visit: Payer: Medicare Other | Attending: Physician Assistant

## 2023-01-28 VITALS — BP 101/68 | HR 74 | Ht 65.0 in | Wt 149.8 lb

## 2023-01-28 DIAGNOSIS — E1122 Type 2 diabetes mellitus with diabetic chronic kidney disease: Secondary | ICD-10-CM

## 2023-01-28 DIAGNOSIS — N181 Chronic kidney disease, stage 1: Secondary | ICD-10-CM | POA: Diagnosis not present

## 2023-01-28 DIAGNOSIS — I5020 Unspecified systolic (congestive) heart failure: Secondary | ICD-10-CM

## 2023-01-28 DIAGNOSIS — I129 Hypertensive chronic kidney disease with stage 1 through stage 4 chronic kidney disease, or unspecified chronic kidney disease: Secondary | ICD-10-CM | POA: Diagnosis not present

## 2023-01-28 DIAGNOSIS — I10 Essential (primary) hypertension: Secondary | ICD-10-CM

## 2023-01-28 DIAGNOSIS — M25562 Pain in left knee: Secondary | ICD-10-CM

## 2023-01-28 LAB — POCT GLYCOSYLATED HEMOGLOBIN (HGB A1C): HbA1c, POC (controlled diabetic range): 6.8 % (ref 0.0–7.0)

## 2023-01-28 MED ORDER — METFORMIN HCL ER 500 MG PO TB24
ORAL_TABLET | ORAL | 2 refills | Status: DC
Start: 2023-01-28 — End: 2023-06-30

## 2023-01-28 NOTE — Assessment & Plan Note (Signed)
-  BP at goal -continue current antihypertensive regimen

## 2023-01-28 NOTE — Assessment & Plan Note (Signed)
-  likely secondary to recent inciting injury, seems to be healing appropriately. Low suspicion for fracture or dislocation, no indication for imaging at this time and patient also politely declines imaging when asked if he would prefer. Reassuringly neurovascular intact -RICE therapy discussed -follow up in 2-3 weeks if no improvement, may consider obtaining imaging at this time if appropriate

## 2023-01-28 NOTE — Progress Notes (Signed)
    SUBJECTIVE:   CHIEF COMPLAINT / HPI:   Patient presents for follow up, also concerned for left knee pain that started after he fell while he was biking. He lost his balance momentarily. This occurred 2 days ago. Painful with walking but going slow helps. He noticed minimal swelling when it first started. He had some bleeding from a hitting it on the concrete and cleaned it with alcohol. The area is healing well but he is still having some pain that has been that is primarily when he is active. Sitting and resting helps resolve.   History of hypertension, compliant on entresto and metoprolol daily. Denies chest pain, dyspnea, leg swelling or other symptoms.   History of DM, compliant on metformin 500 mg twice daily. He is requesting refills today. Tolerating medication well.   OBJECTIVE:   BP 101/68   Pulse 74   Ht 5\' 5"  (1.651 m)   Wt 149 lb 12.8 oz (67.9 kg)   SpO2 100%   BMI 24.93 kg/m   General: Patient well-appearing, in no acute distress. CV: RRR, no murmurs or gallops auscultated Resp: CTAB, no wheezing, rales or rhonchi noted MSK: no gross deformity, sanguineous crust noted along anterior lateral lower portion of left knee, no edema noted, not warm to touch, no point tenderness noted, full active ROM intact, negative anterior and posterior drawer testing, negative Thessaly's testing, no tenderness within palpation of the popliteal fossa Neuro: gross sensation intact, normal gait Psych: mood appropriate, very pleasant   ASSESSMENT/PLAN:   Diabetes (HCC) -A1c 6.8, at goal of <8 -continue metformin 500 mg bid, refills provided -follow up in 3-6 months for repeat A1c  Essential hypertension -BP at goal -continue current antihypertensive regimen  Left knee pain -likely secondary to recent inciting injury, seems to be healing appropriately. Low suspicion for fracture or dislocation, no indication for imaging at this time and patient also politely declines imaging when  asked if he would prefer. Reassuringly neurovascular intact -RICE therapy discussed -follow up in 2-3 weeks if no improvement, may consider obtaining imaging at this time if appropriate    Audree Schrecengost Robyne Peers, DO [facility]

## 2023-01-28 NOTE — Assessment & Plan Note (Signed)
-  A1c 6.8, at goal of <8 -continue metformin 500 mg bid, refills provided -follow up in 3-6 months for repeat A1c

## 2023-01-28 NOTE — Patient Instructions (Signed)
It was great seeing you today!  Today we discussed many things. Your blood pressure looks great, please continue to take all medications as prescribed.  Your A1c is 6.8! I have sent refills on your metformin.  Your knee pain seems to be from the scab that formed, rest and apply ice to the area. Elevating the leg can help as well. If this is not better within 2-3 weeks then please return and we can consider getting imaging at that time.   Please follow up at your next scheduled appointment, if anything arises between now and then, please don't hesitate to contact our office.   Thank you for allowing [place] to be a part of your medical care!  Thank you, Dr. Robyne Peers

## 2023-01-29 LAB — COMPREHENSIVE METABOLIC PANEL
ALT: 33 IU/L (ref 0–44)
AST: 25 IU/L (ref 0–40)
Albumin: 4.2 g/dL (ref 3.9–4.9)
Alkaline Phosphatase: 112 IU/L (ref 44–121)
BUN/Creatinine Ratio: 14 (ref 10–24)
BUN: 17 mg/dL (ref 8–27)
Bilirubin Total: 0.5 mg/dL (ref 0.0–1.2)
CO2: 26 mmol/L (ref 20–29)
Calcium: 9.3 mg/dL (ref 8.6–10.2)
Chloride: 101 mmol/L (ref 96–106)
Creatinine, Ser: 1.18 mg/dL (ref 0.76–1.27)
Globulin, Total: 2.3 g/dL (ref 1.5–4.5)
Glucose: 135 mg/dL — ABNORMAL HIGH (ref 70–99)
Potassium: 4.7 mmol/L (ref 3.5–5.2)
Sodium: 139 mmol/L (ref 134–144)
Total Protein: 6.5 g/dL (ref 6.0–8.5)
eGFR: 68 mL/min/{1.73_m2} (ref >59)

## 2023-01-29 LAB — LIPID PANEL
Chol/HDL Ratio: 3.9 ratio (ref 0.0–5.0)
Cholesterol, Total: 128 mg/dL (ref 100–199)
HDL: 33 mg/dL — ABNORMAL LOW (ref >39)
LDL Chol Calc (NIH): 76 mg/dL (ref 0–99)
Triglycerides: 98 mg/dL (ref 0–149)
VLDL Cholesterol Cal: 19 mg/dL (ref 5–40)

## 2023-01-30 ENCOUNTER — Telehealth: Payer: Self-pay | Admitting: *Deleted

## 2023-01-30 DIAGNOSIS — Z79899 Other long term (current) drug therapy: Secondary | ICD-10-CM

## 2023-01-30 MED ORDER — EZETIMIBE 10 MG PO TABS: 10.0000 mg | ORAL_TABLET | Freq: Every day | ORAL | 3 refills | Status: AC

## 2023-01-30 NOTE — Telephone Encounter (Signed)
-----   Message from Beatrice Lecher, [place] sent at 01/30/2023  7:55 AM EDT ----- Results sent to [facility] via MyChart. See MyChart comments below. PLAN:  -Start ezetimibe 10 mg daily -Lipids, LFTs in 3 months  Mr. Mangan  Your kidney function (creatinine), potassium, liver enzymes (AST, ALT) are normal.  Your LDL cholesterol has significantly improved.  However, it is still above goal.  Goal LDL is <55.  Continue current medications.  I will add a new medication called ezetimibe 10 mg daily.  This should help get your LDL even lower.  We will check labs again in 3 months after starting the ezetimibe. [name], PA-C

## 2023-02-04 ENCOUNTER — Ambulatory Visit: Payer: Medicare Other | Attending: Physician Assistant

## 2023-02-04 DIAGNOSIS — I5020 Unspecified systolic (congestive) heart failure: Secondary | ICD-10-CM

## 2023-02-04 LAB — BASIC METABOLIC PANEL
BUN/Creatinine Ratio: 16 (ref 10–24)
BUN: 14 mg/dL (ref 8–27)
CO2: 25 mmol/L (ref 20–29)
Calcium: 9.3 mg/dL (ref 8.6–10.2)
Chloride: 104 mmol/L (ref 96–106)
Creatinine, Ser: 0.9 mg/dL (ref 0.76–1.27)
Glucose: 134 mg/dL — ABNORMAL HIGH (ref 70–99)
Potassium: 4.5 mmol/L (ref 3.5–5.2)
Sodium: 141 mmol/L (ref 134–144)
eGFR: 95 mL/min/{1.73_m2} (ref >59)

## 2023-02-12 ENCOUNTER — Encounter: Payer: Self-pay | Admitting: *Deleted

## 2023-02-18 ENCOUNTER — Ambulatory Visit: Payer: Medicare Other | Attending: Physician Assistant | Admitting: Physician Assistant

## 2023-02-18 ENCOUNTER — Encounter: Payer: Self-pay | Admitting: Physician Assistant

## 2023-02-18 VITALS — BP 110/62 | HR 72 | Ht 65.0 in | Wt 154.2 lb

## 2023-02-18 DIAGNOSIS — I2510 Atherosclerotic heart disease of native coronary artery without angina pectoris: Secondary | ICD-10-CM | POA: Diagnosis not present

## 2023-02-18 DIAGNOSIS — I5020 Unspecified systolic (congestive) heart failure: Secondary | ICD-10-CM | POA: Diagnosis not present

## 2023-02-18 DIAGNOSIS — M79605 Pain in left leg: Secondary | ICD-10-CM | POA: Insufficient documentation

## 2023-02-18 DIAGNOSIS — E785 Hyperlipidemia, unspecified: Secondary | ICD-10-CM

## 2023-02-18 DIAGNOSIS — I10 Essential (primary) hypertension: Secondary | ICD-10-CM

## 2023-02-18 DIAGNOSIS — I739 Peripheral vascular disease, unspecified: Secondary | ICD-10-CM | POA: Diagnosis not present

## 2023-02-18 DIAGNOSIS — E1169 Type 2 diabetes mellitus with other specified complication: Secondary | ICD-10-CM | POA: Diagnosis not present

## 2023-02-18 DIAGNOSIS — Z7984 Long term (current) use of oral hypoglycemic drugs: Secondary | ICD-10-CM

## 2023-02-18 MED ORDER — EMPAGLIFLOZIN 10 MG PO TABS: 10.0000 mg | ORAL_TABLET | Freq: Every day | ORAL | 11 refills | Status: DC

## 2023-02-18 MED ORDER — EMPAGLIFLOZIN 10 MG PO TABS: 10.0000 mg | ORAL_TABLET | Freq: Every day | ORAL | Status: AC

## 2023-02-18 NOTE — Assessment & Plan Note (Signed)
Controlled. Continue Metoprolol succinate 50 mg once daily, Spironolactone 12.5 mg once daily, Entrestro 24.26 mg twice daily.

## 2023-02-18 NOTE — Assessment & Plan Note (Signed)
Anterior STEMI in 2014 in [place] s/p BMS to LAD and DES to RCA. He is doing well w/o angina. Continue ASA 81 mg once daily, Atorvastatin 80 mg once daily, Metoprolol succinate 50 mg once daily.

## 2023-02-18 NOTE — Progress Notes (Addendum)
Cardiology Office Note:    Date:  02/18/2023  ID:  [name], DOB 11/22/1955, MRN [id number] PCP: Tiffany Kocher, DO  [facility] Providers Cardiologist:  Donato Schultz, MD       Patient Profile:      Coronary artery disease  Anterior STEMI in 01/2013 [facility] in [place], [place]) s/p 3.5 x 18 mm BMS to the mLAD, 4 x 26 mm DES to the pRCA, 3 x 22 mm BMS to dRCA  Residual disease: mLCx 70-80. EF 50. Hosp stay c/b pseudoaneurysm and hematoma.  (HFrEF) heart failure with reduced ejection fraction ?Ischemic CM  TTE 06/17/2022: EF 30-35, global HK, worse inferiorly TTE 10/04/2022: EF 35-40, global HK, normal RV size, trivial MR, trivial AI, RAP 3 Hyperlipidemia  Hypertension Diabetes mellitus   R brain CVA in 06/2022 Head/Neck CTA 06/2022: No significant carotid stenosis, Mod stenosis of R VA, 56% L subclavian  Hx of GI bleed 10/2022 >> s/p Txn 1u PRBCs; Clopidogrel DC'd Renal artery stenosis CT 10/2022: severe ostial stenosis of 2 of the 3 R RAs; mod SMA stenosis Aortic atherosclerosis Hx of R leg DVT s/p IVC filter (2014, at time of MI) Records from [facility] mention "mild hemophilia"      History of Present Illness:   Mike [name] is a 67 y.o. male who returns for follow up of CAD, CHF. He was last seen 01/21/23. Metoprolol succinate was increased and he was started on Spironolactone. He is here alone. He notes intermittent left leg pain that began approximately four weeks ago. The pain is not associated with any specific activity and does not worsen with walking. In fact, the patient reports that the pain tends to subside with increased activity. The pain is described as being more than muscular, but the patient is unable to further characterize it. The patient denies any chest discomfort, shortness of breath, or dizziness. They report being able to walk a couple of blocks without stopping and have been engaging in regular walking exercise. They deny any swelling in their legs or  ankles. The patient also reports a change in their primary care provider and has not yet established care with the new provider.   Discussed the use of AI scribe software for clinical note transcription with the patient, who gave verbal consent to proceed.  ROS See HPI    Studies Reviewed:       Risk Assessment/Calculations:           Physical Exam:   VS:  BP 110/62   Pulse 72   Ht 5\' 5"  (1.651 m)   Wt 154 lb 3.2 oz (69.9 kg)   SpO2 98%   BMI 25.66 kg/m    Wt Readings from Last 3 Encounters:  02/18/23 154 lb 3.2 oz (69.9 kg)  01/28/23 149 lb 12.8 oz (67.9 kg)  01/21/23 149 lb 9.6 oz (67.9 kg)    Physical Exam   GEN: Well developed, well nourished, no acute distress NECK: No jugular venous distension. CHEST: Lungs clear to auscultation bilaterally, no wheezing, no rhonchi, no rales. CARDIOVASCULAR: Normal S1, S2, regular rate and rhythm, no murmur. ABD: soft EXTREMITIES: No edema.       ASSESSMENT AND PLAN:   HFrEF (heart failure with reduced ejection fraction) (HCC) EF 35-40% on recent echocardiogram. NYHA II. Currently on three out of four guideline-directed medical therapies (beta-blocker, ARNI, and aldosterone antagonist). -Initiate SGLT2 inhibitor (Jardiance 10mg  daily) as fourth class of therapy. -Continue Toprol XL 50 mg daily, Entresto 24/26  mg twice daily, Spironolactone 12.5 mg daily -Check renal function in 2 weeks after initiation of Jardiance. -Plan repeat echocardiogram in 2 months to assess response to therapy, recheck EF. -Follow-up in clinic in 6 months or sooner if any changes in symptoms.   Hyperlipidemia associated with type 2 diabetes mellitus (HCC) Recent LDL in 01/2023 was 76. LDL above goal of < 55 despite max Atorvastatin therapy. -Zetia previously added to regimen and repeat lipid panel, LFTs is planned in 3 months to assess response.  Claudication (HCC) Intermittent pain in the leg, not associated with activity. No clear vascular or neurogenic  claudication. Difficult to palpate distal pulses. -Order ABIs and lower extremity ultrasound to evaluate for peripheral arterial disease. -If normal, refer to primary care for further evaluation of possible musculoskeletal or neurologic etiology.  Essential hypertension Controlled. Continue Metoprolol succinate 50 mg once daily, Spironolactone 12.5 mg once daily, Entrestro 24.26 mg twice daily.   CAD (coronary artery disease) Anterior STEMI in 2014 in [place] s/p BMS to LAD and DES to RCA. He is doing well w/o angina. Continue ASA 81 mg once daily, Atorvastatin 80 mg once daily, Metoprolol succinate 50 mg once daily.     Dispo:  Return in about 6 months (around 08/21/2023) for Routine Follow Up w Dr. Anne Fu.  Signed, [name], PA-C

## 2023-02-18 NOTE — Patient Instructions (Signed)
Medication Instructions:  Your physician has recommended you make the following change in your medication:  1.Start Jardiance 10 mg daily  *If you need a refill on your cardiac medications before your next appointment, please call your pharmacy*   Lab Work: BMET-IN 2-3 WEEKS If you have labs (blood work) drawn today and your tests are completely normal, you will receive your results only by: MyChart Message (if you have MyChart) OR A paper copy in the mail If you have any lab test that is abnormal or we need to change your treatment, we will call you to review the results.   Testing/Procedures: Your physician has requested that you have an echocardiogram late September-early October. Echocardiography is a painless test that uses sound waves to create images of your heart. It provides your doctor with information about the size and shape of your heart and how well your heart's chambers and valves are working. This procedure takes approximately one hour. There are no restrictions for this procedure. Please do NOT wear cologne, perfume, aftershave, or lotions (deodorant is allowed). Please arrive 15 minutes prior to your appointment time.   Your physician has requested that you have an ankle brachial index (ABI). During this test an ultrasound and blood pressure cuff are used to evaluate the arteries that supply the arms and legs with blood. Allow thirty minutes for this exam. There are no restrictions or special instructions.   Follow-Up: At [facility], you and your health needs are our priority.  As part of our continuing mission to provide you with exceptional heart care, we have created designated Provider Care Teams.  These Care Teams include your primary Cardiologist (physician) and Advanced Practice Providers (APPs -  Physician Assistants and Nurse Practitioners) who all work together to provide you with the care you need, when you need it.   Your next appointment:   6  month(s)  Provider:   Donato Schultz, MD

## 2023-02-18 NOTE — Assessment & Plan Note (Signed)
EF 35-40% on recent echocardiogram. NYHA II. Currently on three out of four guideline-directed medical therapies (beta-blocker, ARNI, and aldosterone antagonist). -Initiate SGLT2 inhibitor (Jardiance 10mg  daily) as fourth class of therapy. -Continue Toprol XL 50 mg daily, Entresto 24/26 mg twice daily, Spironolactone 12.5 mg daily -Check renal function in 2 weeks after initiation of Jardiance. -Plan repeat echocardiogram in 2 months to assess response to therapy, recheck EF. -Follow-up in clinic in 6 months or sooner if any changes in symptoms.

## 2023-02-18 NOTE — Assessment & Plan Note (Signed)
Recent LDL in 01/2023 was 76. LDL above goal of < 55 despite max Atorvastatin therapy. -Zetia previously added to regimen and repeat lipid panel, LFTs is planned in 3 months to assess response.

## 2023-02-18 NOTE — Assessment & Plan Note (Signed)
Intermittent pain in the leg, not associated with activity. No clear vascular or neurogenic claudication. Difficult to palpate distal pulses. -Order ABIs and lower extremity ultrasound to evaluate for peripheral arterial disease. -If normal, refer to primary care for further evaluation of possible musculoskeletal or neurologic etiology.

## 2023-02-19 ENCOUNTER — Ambulatory Visit: Payer: Medicare Other | Admitting: Neurology

## 2023-02-19 ENCOUNTER — Encounter: Payer: Self-pay | Admitting: Neurology

## 2023-02-19 VITALS — BP 138/80 | HR 68 | Ht 66.0 in | Wt 153.0 lb

## 2023-02-19 DIAGNOSIS — E785 Hyperlipidemia, unspecified: Secondary | ICD-10-CM

## 2023-02-19 DIAGNOSIS — I63431 Cerebral infarction due to embolism of right posterior cerebral artery: Secondary | ICD-10-CM | POA: Diagnosis not present

## 2023-02-19 DIAGNOSIS — I10 Essential (primary) hypertension: Secondary | ICD-10-CM

## 2023-02-19 DIAGNOSIS — E1169 Type 2 diabetes mellitus with other specified complication: Secondary | ICD-10-CM | POA: Diagnosis not present

## 2023-02-19 NOTE — Patient Instructions (Signed)
Continue the aspirin 81 mg daily for secondary stroke prevention   Strict management of vascular risk factors with a goal BP less than 130/90, A1c less than 7.0, LDL less than 70 for secondary stroke prevention  Follow up with ENT about voice changed   Continue close follow up with primary care, return here as needed

## 2023-02-19 NOTE — Progress Notes (Signed)
Patient: Mike [name] Date of Birth: June 08, 1956  Reason for Visit: Stroke Clinic Follow-Up  History from: Patient Primary Neurologist: Dr. Pearlean Brownie (Saw Dr. Roda Shutters)  ASSESSMENT AND PLAN 67 y.o. year old male   1.  Stroke-acute lacunar infarct in the right corona radiata and lentiform -Etiology likely large vessel intracranial stenosis, right MCA stenosis 2.  Hypertension 3.  Hyperlipidemia 4.  Type 2 diabetes 5.  Gastric ulcer, H. pylori, gastric AVM 6.  Heart failure, EF 35 to 40%  -Continue aspirin 81 mg daily for secondary stroke prevention, on 3 months of DAPT at time of gastric bleeding, since then maintained on aspirin 81 mg daily without issue -Recommended he follow back up with ENT regarding vocal change, can resume speech therapy if he feels beneficial -Strict management of vascular risk factors with a goal BP less than 130/90, A1c less than 7.0, LDL less than 70 for secondary stroke prevention -Commended on improvement with A1c, now within goal at 6.8, BP nearly at goal 138/80 on metoprolol, Entresto, spironolactone, LDL near goal at 76 on Lipitor 80 -Continue close follow-up with primary care/cardiology, return here as needed  HISTORY OF PRESENT ILLNESS: Today 02/19/23 Admitted Feb 2024 for GI bleed,HGB 8 had gastric ulcer and H. Pylori. He was on aspirin and Plavix at the time. Now just taking aspirin 81 mg daily. His speech is soft, no aphasia. Feels voice is weak, was supposed to see ENT, put had to cancel. Was getting stronger with ST. Swallowing is good. Left side is back to normal. Sometimes feels sad, depressed. On Lexapro, but ran out. 01/28/23 A1C 6.8, LDL 76. Just started on Jardiance yesterday from cardiology. BP 138/80.  07/25/22 SS: [name] here today for stroke clinic follow-up.  Presented 06/15/22 with left-sided weakness and facial droop, imbalance, falls. He called his brother, they went to the hospital. He lives alone. Found to have acute lacunar infarct in  the right corona radiata and lentiform.  EF was 30 to 35%, had outpatient follow-up 07/11/22 with Dr. Anne Fu, unclear if EF is chronic.  Started on Toprol, Entresto, recheck echo in 3 months. He feels he is getting better but slowly, speech is weak, mild dysarthria. Remains in ST. Swallowing is fine. Left side is back to normal, balance is good. Other than speech feels back to normal. Remains on aspirin, Plavix, Lipitor. He is retired. Didn't take his medications. He has slightly left sided mouth asymmetry.   -Code stroke CT head lacunar infarct to the right corona radiata and lentiform -CTA head and neck no LVO, distal right MCA M1 bifurcation with near occlusion of the anterior M2 division.  Moderate stenosis of left PCA distal P2 and P3 segments.  This could be thromboembolic disease rather than intracranial atherosclerosis. -MRI of the brain acute lacunar infarct in the right corona radiata and lentiform -2D echo EF 30 to 35% -LDL 218 -A1c 10.4 -No antithrombotic prior to admission, aspirin 325 mg daily and Plavix 75 mg daily for 3 months then aspirin alone given intracranial stenosis.  HISTORY  Copied 06/15/22 Dr. Derry Lory: Mike [name] is a 67 y.o. male with PMH significant for HTN, DM2, HLD, kidney stones, CAD who presents with L sided weakness.   Last night 2300, was watching TV, got up but felt off balance and fell. Left leg was worse than right. Also noted dropping objects from the left hand. This AM, fell again. Called family and family drove him to the ED. Enroute, he developed a L facial droop. On  arrival to ED, stroke code activated.   LKW: 2300 on 06/14/22 mRS: 0 tNKASE: not offered, outside window Thrombectomy: not offered, low suspicion for LVO  REVIEW OF SYSTEMS: Out of a complete 14 system review of symptoms, the patient complains only of the following symptoms, and all other reviewed systems are negative.  See HPI  ALLERGIES: No Known Allergies  HOME  MEDICATIONS: Outpatient Medications Prior to Visit  Medication Sig Dispense Refill   aspirin EC 81 MG tablet Take 1 tablet (81 mg total) by mouth daily. Swallow whole. 30 tablet 12   atorvastatin (LIPITOR) 80 MG tablet Take 1 tablet (80 mg total) by mouth daily. 90 tablet 1   clotrimazole (CLOTRIMAZOLE ANTI-FUNGAL) 1 % cream Apply 1 Application topically 2 (two) times daily. 60 g 0   empagliflozin (JARDIANCE) 10 MG TABS tablet Take 1 tablet (10 mg total) by mouth daily before breakfast. 30 tablet 11   empagliflozin (JARDIANCE) 10 MG TABS tablet Take 1 tablet (10 mg total) by mouth daily before breakfast.     escitalopram (LEXAPRO) 10 MG tablet Take 1 tablet (10 mg total) by mouth daily. 30 tablet 0   ezetimibe (ZETIA) 10 MG tablet Take 1 tablet (10 mg total) by mouth daily. 90 tablet 3   metFORMIN (GLUCOPHAGE-XR) 500 MG 24 hr tablet TAKE 1 TABLET BY MOUTH IN THE MORNING AND AT BEDTIME 90 tablet 2   metoprolol succinate (TOPROL XL) 50 MG 24 hr tablet Take 1 tablet (50 mg total) by mouth daily. Take with or immediately following a meal. 90 tablet 3   Na Sulfate-K Sulfate-Mg Sulf 17.5-3.13-1.6 GM/177ML SOLN Use as directed; may use generic; goodrx card if insurance will not cover generic 354 mL 0   pantoprazole (PROTONIX) 40 MG tablet Take 1 tablet (40 mg total) by mouth 2 (two) times daily. 60 tablet 11   polyethylene glycol powder (GLYCOLAX/MIRALAX) 17 GM/SCOOP powder Take 17 g by mouth daily. 500 g 0   sacubitril-valsartan (ENTRESTO) 24-26 MG Take 1 tablet by mouth 2 (two) times daily. 180 tablet 3   spironolactone (ALDACTONE) 25 MG tablet Take 0.5 tablets (12.5 mg total) by mouth daily. 135 tablet 3   No facility-administered medications prior to visit.    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     PAST SURGICAL HISTORY: Past Surgical History:   Procedure Laterality Date   BIOPSY  10/07/2022   Procedure: BIOPSY;  Surgeon: Benancio Deeds, MD;  Location: [facility] ENDOSCOPY;  Service: Gastroenterology;;   COLONOSCOPY N/A 10/07/2022   Procedure: COLONOSCOPY;  Surgeon: Benancio Deeds, MD;  Location: [facility] ENDOSCOPY;  Service: Gastroenterology;  Laterality: N/A;   COLONOSCOPY WITH PROPOFOL N/A 10/08/2022   Procedure: COLONOSCOPY WITH PROPOFOL;  Surgeon: Benancio Deeds, MD;  Location: [facility] ENDOSCOPY;  Service: Gastroenterology;  Laterality: N/A;   CORONARY ANGIOPLASTY WITH STENT PLACEMENT     ESOPHAGOGASTRODUODENOSCOPY (EGD) WITH PROPOFOL N/A 10/07/2022   Procedure: ESOPHAGOGASTRODUODENOSCOPY (EGD) WITH PROPOFOL;  Surgeon: Benancio Deeds, MD;  Location: [facility] ENDOSCOPY;  Service: Gastroenterology;  Laterality: N/A;   HOT HEMOSTASIS N/A 10/07/2022   Procedure: HOT HEMOSTASIS (ARGON PLASMA COAGULATION/BICAP);  Surgeon: Benancio Deeds, MD;  Location: [facility] ENDOSCOPY;  Service: Gastroenterology;  Laterality: N/A;   VENA CAVA FILTER PLACEMENT      FAMILY HISTORY: Family History  Problem Relation Age of Onset   Liver cancer Father    High blood pressure Mother    Ovarian cancer Mother     SOCIAL HISTORY: Social History   Socioeconomic History   Marital status: Legally Separated    Spouse name: Not on file   Number of children: Not on file   Years of education: Not on file   Highest education level: Not on file  Occupational History   Not on file  Tobacco Use   Smoking status: Never   Smokeless tobacco: Not on file  Substance and Sexual Activity   Alcohol use: No   Drug use: No   Sexual activity: Not on file  Other Topics Concern   Not on file  Social History Narrative   Diet: Low Carb   Caffeine: 1 cup of coffee daily   Marital Status: Separated, married 1992   Lives in house, 1 stories, 3 persons, no pets   Current/Past profession: Naval architect    Exercise: No   Living Will: No    DNR- question mark    POA/HPOA- No as of 09/06/14   Social Determinants of Health   Financial Resource Strain: Not on file  Food Insecurity: Not on file  Transportation Needs: Not on file  Physical Activity: Not on file  Stress: Not on file  Social Connections: Not on file  Intimate Partner Violence: Not on file   PHYSICAL EXAM  Vitals:   02/19/23 1254  BP: 138/80  Pulse: 68  Weight: 153 lb (69.4 kg)  Height: 5\' 6"  (1.676 m)   Body mass index is 24.69 kg/m.  Generalized: Well developed, in no acute distress, well-dressed Neurological examination  Mentation: Alert oriented to time, place, history taking. Follows all commands, vocal quality is soft, mild dysarthria, not aphasic.  Very pleasant Cranial nerve II-XII: Pupils were equal round reactive to light. Extraocular movements were full, visual field were full on confrontational test. Facial sensation and strength were normal. Head turning and shoulder shrug  were normal and symmetric.  Mild asymmetry to left mouth with smile Motor: The motor testing reveals 5 over 5 strength of all 4 extremities. Good symmetric motor tone is noted throughout.  Sensory: Sensory testing is intact to soft touch on all 4 extremities. No evidence of extinction is noted.  Coordination: Cerebellar testing reveals good finger-nose-finger and heel-to-shin bilaterally.  Gait and station: Gait is normal.  Reflexes: Deep tendon reflexes are symmetric and normal bilaterally.   DIAGNOSTIC DATA (LABS, IMAGING, TESTING) - I reviewed patient records, labs, notes, testing and imaging myself where available.  Lab Results  Component Value Date   WBC 6.0 10/17/2022   HGB 11.1 (L) 10/17/2022   HCT 33.6 (L) 10/17/2022   MCV 92 10/17/2022   PLT 372 10/17/2022      Component Value Date/Time   NA 141 02/04/2023 0922   K 4.5 02/04/2023 0922   CL 104 02/04/2023 0922   CO2 25 02/04/2023 0922   GLUCOSE 134 (H) 02/04/2023 0922   GLUCOSE 89 10/09/2022 0711   BUN 14 02/04/2023 0922    CREATININE 0.90 02/04/2023 0922   CALCIUM 9.3 02/04/2023 0922   PROT 6.5 01/28/2023 0729   ALBUMIN 4.2 01/28/2023 0729   AST 25 01/28/2023 0729   ALT 33 01/28/2023 0729   ALKPHOS 112 01/28/2023 0729   BILITOT 0.5 01/28/2023 0729   GFRNONAA >60 10/09/2022 0711   GFRAA >90 11/13/2014 1118   Lab Results  Component Value Date   CHOL  128 01/28/2023   HDL 33 (L) 01/28/2023   LDLCALC 76 01/28/2023   LDLDIRECT 218 (H) 06/16/2022   TRIG 98 01/28/2023   CHOLHDL 3.9 01/28/2023   Lab Results  Component Value Date   HGBA1C 6.8 01/28/2023   Lab Results  Component Value Date   VITAMINB12 218 06/16/2022   Lab Results  Component Value Date   TSH 0.707 10/03/2022    Margie Ege, AGNP-C, DNP 02/19/2023, 3:05 PM [facility] [address] [place], [place] [address] [phone]

## 2023-02-20 ENCOUNTER — Other Ambulatory Visit: Payer: Self-pay

## 2023-02-20 DIAGNOSIS — F339 Major depressive disorder, recurrent, unspecified: Secondary | ICD-10-CM

## 2023-02-20 MED ORDER — ESCITALOPRAM OXALATE 10 MG PO TABS
10.0000 mg | ORAL_TABLET | Freq: Every day | ORAL | 0 refills | Status: DC
Start: 2023-02-20 — End: 2023-07-18

## 2023-03-01 NOTE — Progress Notes (Signed)
I agree with the above plan 

## 2023-03-04 ENCOUNTER — Ambulatory Visit: Payer: Medicare Other | Attending: Physician Assistant

## 2023-03-04 DIAGNOSIS — I2510 Atherosclerotic heart disease of native coronary artery without angina pectoris: Secondary | ICD-10-CM

## 2023-03-04 DIAGNOSIS — I5020 Unspecified systolic (congestive) heart failure: Secondary | ICD-10-CM

## 2023-03-04 LAB — BASIC METABOLIC PANEL
BUN/Creatinine Ratio: 15 (ref 10–24)
BUN: 17 mg/dL (ref 8–27)
CO2: 23 mmol/L (ref 20–29)
Calcium: 9.4 mg/dL (ref 8.6–10.2)
Chloride: 102 mmol/L (ref 96–106)
Creatinine, Ser: 1.17 mg/dL (ref 0.76–1.27)
Glucose: 106 mg/dL — ABNORMAL HIGH (ref 70–99)
Potassium: 4.4 mmol/L (ref 3.5–5.2)
Sodium: 138 mmol/L (ref 134–144)
eGFR: 69 mL/min/{1.73_m2} (ref >59)

## 2023-03-11 ENCOUNTER — Ambulatory Visit (HOSPITAL_COMMUNITY)
Admission: RE | Admit: 2023-03-11 | Discharge: 2023-03-11 | Disposition: A | Discharge status: Home / Self Care | Payer: Medicare Other | Source: Ambulatory Visit | Attending: Internal Medicine | Admitting: Internal Medicine

## 2023-03-11 DIAGNOSIS — I739 Peripheral vascular disease, unspecified: Secondary | ICD-10-CM | POA: Diagnosis present

## 2023-03-11 LAB — VAS US ABI WITH/WO TBI
Left ABI: 1.11
Right ABI: 1.28

## 2023-03-13 ENCOUNTER — Encounter: Payer: Self-pay | Admitting: Physician Assistant

## 2023-05-02 ENCOUNTER — Ambulatory Visit: Payer: Medicare Other | Attending: Physician Assistant

## 2023-05-02 DIAGNOSIS — Z79899 Other long term (current) drug therapy: Secondary | ICD-10-CM

## 2023-05-02 LAB — HEPATIC FUNCTION PANEL
ALT: 25 [IU]/L (ref 0–44)
AST: 15 [IU]/L (ref 0–40)
Albumin: 4.2 g/dL (ref 3.9–4.9)
Alkaline Phosphatase: 113 [IU]/L (ref 44–121)
Bilirubin Total: 0.3 mg/dL (ref 0.0–1.2)
Bilirubin, Direct: 0.13 mg/dL (ref 0.00–0.40)
Total Protein: 7.1 g/dL (ref 6.0–8.5)

## 2023-05-02 LAB — LIPID PANEL
Chol/HDL Ratio: 4.3 {ratio} (ref 0.0–5.0)
Cholesterol, Total: 124 mg/dL (ref 100–199)
HDL: 29 mg/dL — ABNORMAL LOW (ref >39)
LDL Chol Calc (NIH): 75 mg/dL (ref 0–99)
Triglycerides: 108 mg/dL (ref 0–149)
VLDL Cholesterol Cal: 20 mg/dL (ref 5–40)

## 2023-05-05 ENCOUNTER — Ambulatory Visit (HOSPITAL_COMMUNITY): Payer: Medicare Other | Attending: Cardiovascular Disease

## 2023-05-05 DIAGNOSIS — I5020 Unspecified systolic (congestive) heart failure: Secondary | ICD-10-CM | POA: Diagnosis present

## 2023-05-05 LAB — ECHOCARDIOGRAM COMPLETE
Area-P 1/2: 2.6 cm2
P 1/2 time: 1000 ms
S' Lateral: 2.8 cm

## 2023-05-09 ENCOUNTER — Telehealth: Payer: Self-pay | Admitting: *Deleted

## 2023-05-09 DIAGNOSIS — I10 Essential (primary) hypertension: Secondary | ICD-10-CM

## 2023-05-09 DIAGNOSIS — E1169 Type 2 diabetes mellitus with other specified complication: Secondary | ICD-10-CM

## 2023-05-09 NOTE — Telephone Encounter (Signed)
-----   Message from Mike [name] sent at 05/04/2023 10:00 PM EDT ----- Results sent to [facility] via [facility]. See MyChart comments below. PLAN:  -Refer to [facility] to initiate PCSK9 inhibitor   Mike [name]  Your LDL cholesterol is above goal (goal is < 55). Your liver enzymes (AST, ALT) are normal. I will refer you to our lipid clinic to see if we can get approval for one of the newer medications that is very good at lowering cholesterol. Mike Newcomer, PA-C

## 2023-06-30 ENCOUNTER — Other Ambulatory Visit: Payer: Self-pay

## 2023-06-30 DIAGNOSIS — E1122 Type 2 diabetes mellitus with diabetic chronic kidney disease: Secondary | ICD-10-CM

## 2023-06-30 MED ORDER — METFORMIN HCL ER 500 MG PO TB24
ORAL_TABLET | ORAL | 2 refills | Status: DC
Start: 2023-06-30 — End: 2023-11-12

## 2023-07-18 ENCOUNTER — Ambulatory Visit (INDEPENDENT_AMBULATORY_CARE_PROVIDER_SITE_OTHER): Payer: Medicare Other | Admitting: Student

## 2023-07-18 ENCOUNTER — Encounter: Payer: Self-pay | Admitting: Student

## 2023-07-18 VITALS — BP 112/81 | HR 73 | Ht 66.0 in | Wt 152.1 lb

## 2023-07-18 DIAGNOSIS — Z1159 Encounter for screening for other viral diseases: Secondary | ICD-10-CM

## 2023-07-18 DIAGNOSIS — F339 Major depressive disorder, recurrent, unspecified: Secondary | ICD-10-CM | POA: Diagnosis not present

## 2023-07-18 DIAGNOSIS — N400 Enlarged prostate without lower urinary tract symptoms: Secondary | ICD-10-CM

## 2023-07-18 DIAGNOSIS — E1169 Type 2 diabetes mellitus with other specified complication: Secondary | ICD-10-CM

## 2023-07-18 LAB — POCT GLYCOSYLATED HEMOGLOBIN (HGB A1C): HbA1c, POC (controlled diabetic range): 6.8 % (ref 0.0–7.0)

## 2023-07-18 MED ORDER — ESCITALOPRAM OXALATE 10 MG PO TABS
10.0000 mg | ORAL_TABLET | Freq: Every day | ORAL | 0 refills | Status: AC
Start: 2023-07-18 — End: ?

## 2023-07-18 MED ORDER — TAMSULOSIN HCL 0.4 MG PO CAPS
0.4000 mg | ORAL_CAPSULE | Freq: Every day | ORAL | 3 refills | Status: AC
Start: 2023-07-18 — End: ?

## 2023-07-18 NOTE — Progress Notes (Signed)
    SUBJECTIVE:   CHIEF COMPLAINT / HPI:   Sleep disturbance Difficult time falling asleep. Napping 2 hours a day, 3 days a week. 12 AM. There is a program on TV he likes to watch at 11:30 PM, stays up on purpose to watch the show. Watches various YouTube videos before sleep.  Once asleep stays asleep. Usually wakes 1-2 times a night to urinate.  Enlarged prostate During hospitalization in March 2024 patient was found to have enlarged prostate gland.  He experiences lower urinary tract symptoms including urinary frequency and nocturia. Brother passed away from metastatic spread of prostate cancer, this year. He was 67 years old. He would like a PSA check -Would like to restart flomax. Can't recall why it was discontinued.  On chart review, unclear why it was discontinued.  He has been without Flomax for at least 1 year.  Diabetes Patient reports compliance with his 500 mg metformin twice daily.  No side effects.  He would like his A1c checked today.  Depression Requests refill of Lexapro.  Symptoms well-controlled with Lexapro.  OBJECTIVE:   BP 112/81   Pulse 73   Ht 5\' 6"  (1.676 m)   Wt 152 lb 2 oz (69 kg)   SpO2 100%   BMI 24.55 kg/m    General: NAD, pleasant Cardio: RRR, no MRG. Cap Refill <2s. Respiratory: CTAB, normal wob on RA Skin: Warm and dry  ASSESSMENT/PLAN:   Assessment & Plan Type 2 diabetes mellitus with other specified complication, unspecified whether long term insulin use (HCC) Well-controlled, A1c of 6.8. - Continue metformin 500 mg twice daily Depression, recurrent (HCC) Well-controlled. -Refilled Lexapro 10 mg daily Enlarged prostate Experiencing lower urinary tract symptoms with evidence of enlarged prostate on CT scan.  Family history of metastatic prostate cancer in first-degree relative. - PSA - Flomax 0.4 milligram daily, counseled on use and side effects Need for hepatitis C screening test Hepatitis C screening today.   Tiffany Kocher,  DO [facility]

## 2023-07-18 NOTE — Patient Instructions (Signed)
It was great to see you! Thank you for allowing me to participate in your care!   I recommend that you always bring your medications to each appointment as this makes it easy to ensure we are on the correct medications and helps [place] not miss when refills are needed.  Our plans for today:  - Take 0.4 mg of Flomax every day to help with urinary frequency. This medication may lower your blood pressure. If you have dizziness when taking this medication, please stop taking the medication and contact our office. - Please avoid screens 1-2 hours before bed time. Please avoid drinking fluids 2 hours before bed time if possible, and avoid caffeine in the afternoon and evenings.  We are checking some labs today, I will call you if they are abnormal will send you a MyChart message or a letter if they are normal.  If you do not hear about your labs in the next 2 weeks please let us know.  Take care and seek immediate care sooner if you develop any concerns. Please remember to show up 15 minutes before your scheduled appointment time!  Tiffany Kocher, DO [facility]

## 2023-07-18 NOTE — Assessment & Plan Note (Signed)
Well-controlled. -Refilled Lexapro 10 mg daily

## 2023-07-18 NOTE — Assessment & Plan Note (Signed)
Well-controlled, A1c of 6.8. - Continue metformin 500 mg twice daily

## 2023-07-19 LAB — PSA: Prostate Specific Ag, Serum: 2.8 ng/mL (ref 0.0–4.0)

## 2023-07-19 LAB — HCV AB W REFLEX TO QUANT PCR: HCV Ab: NONREACTIVE

## 2023-07-19 LAB — HCV INTERPRETATION

## 2023-07-22 ENCOUNTER — Telehealth: Payer: Self-pay | Admitting: Student

## 2023-07-22 NOTE — Telephone Encounter (Signed)
Attempted to call patient per preference, unable to reach, unable to leave voicemail. All labs are normal. Will comment on MyChart.

## 2023-07-31 ENCOUNTER — Ambulatory Visit: Payer: Medicare Other

## 2023-08-19 ENCOUNTER — Ambulatory Visit: Payer: Medicare Other | Attending: Cardiology | Admitting: Cardiology

## 2023-08-20 ENCOUNTER — Encounter: Payer: Self-pay | Admitting: Cardiology

## 2023-10-27 ENCOUNTER — Telehealth: Payer: Self-pay

## 2023-10-27 NOTE — Telephone Encounter (Signed)
 Pt's pharmacy [facility] is stating that there is major hyperkalemia risk from interaction of Entresto and Spironolactone. Pharmacy would like to know if Dr. Anne Fu is aware of this and would he like to prescribe medication Entresto? Please address

## 2023-10-27 NOTE — Telephone Encounter (Signed)
 Pharmacist at [facility] is aware Dr Anne Fu is aware of potential elevated in K+ levels with this medication combo and pt is being monitored.

## 2023-11-12 ENCOUNTER — Other Ambulatory Visit: Payer: Self-pay | Admitting: Student

## 2023-11-12 DIAGNOSIS — E1122 Type 2 diabetes mellitus with diabetic chronic kidney disease: Secondary | ICD-10-CM

## 2023-12-31 ENCOUNTER — Other Ambulatory Visit: Payer: Self-pay | Admitting: Student

## 2023-12-31 DIAGNOSIS — E1122 Type 2 diabetes mellitus with diabetic chronic kidney disease: Secondary | ICD-10-CM

## 2024-02-03 ENCOUNTER — Other Ambulatory Visit: Payer: Self-pay | Admitting: Physician Assistant

## 2024-02-03 ENCOUNTER — Other Ambulatory Visit: Payer: Self-pay

## 2024-02-03 DIAGNOSIS — E1169 Type 2 diabetes mellitus with other specified complication: Secondary | ICD-10-CM

## 2024-02-03 MED ORDER — ATORVASTATIN CALCIUM 80 MG PO TABS
80.0000 mg | ORAL_TABLET | Freq: Every day | ORAL | 0 refills | Status: DC
Start: 2024-02-03 — End: 2024-02-04

## 2024-02-04 ENCOUNTER — Ambulatory Visit: Payer: Self-pay | Admitting: Student

## 2024-02-04 ENCOUNTER — Other Ambulatory Visit: Payer: Self-pay | Admitting: Student

## 2024-02-04 ENCOUNTER — Telehealth: Payer: Self-pay

## 2024-02-04 ENCOUNTER — Encounter: Payer: Self-pay | Admitting: Student

## 2024-02-04 ENCOUNTER — Ambulatory Visit: Admitting: Student

## 2024-02-04 VITALS — BP 137/80 | HR 69 | Ht 66.0 in | Wt 155.2 lb

## 2024-02-04 DIAGNOSIS — Z0000 Encounter for general adult medical examination without abnormal findings: Secondary | ICD-10-CM | POA: Diagnosis not present

## 2024-02-04 DIAGNOSIS — E1169 Type 2 diabetes mellitus with other specified complication: Secondary | ICD-10-CM

## 2024-02-04 DIAGNOSIS — I5020 Unspecified systolic (congestive) heart failure: Secondary | ICD-10-CM

## 2024-02-04 DIAGNOSIS — E785 Hyperlipidemia, unspecified: Secondary | ICD-10-CM | POA: Diagnosis not present

## 2024-02-04 LAB — POCT GLYCOSYLATED HEMOGLOBIN (HGB A1C): HbA1c, POC (controlled diabetic range): 6.1 % (ref 0.0–7.0)

## 2024-02-04 MED ORDER — SACUBITRIL-VALSARTAN 24-26 MG PO TABS
1.0000 | ORAL_TABLET | Freq: Two times a day (BID) | ORAL | 3 refills | Status: DC
Start: 2024-02-04 — End: 2024-02-04

## 2024-02-04 MED ORDER — ATORVASTATIN CALCIUM 80 MG PO TABS
80.0000 mg | ORAL_TABLET | Freq: Every day | ORAL | 0 refills | Status: DC
Start: 2024-02-04 — End: 2024-04-20

## 2024-02-04 NOTE — Assessment & Plan Note (Signed)
-  Needs Eye exam, discussed releasing records to our clinic - A1C, BMP, UACR today

## 2024-02-04 NOTE — Progress Notes (Signed)
    SUBJECTIVE:   Chief compliant/HPI: annual examination  Mike [name] is a 68 y.o. who presents today for an annual exam.   History tabs reviewed and updated.   Review of systems form reviewed and notable for: None.   OBJECTIVE:   BP 137/80   Pulse 69   Ht 5' 6 (1.676 m)   Wt 155 lb 4 oz (70.4 kg)   SpO2 100%   BMI 25.06 kg/m    General: NAD, pleasant Cardio: RRR, no MRG. Cap Refill <2s. Respiratory: CTAB, normal wob on RA Skin: Warm and dry  ASSESSMENT/PLAN:   Assessment & Plan Annual physical exam Considered the following screening exams based upon USPSTF recommendations: Diabetes screening: Has T2DM. Screening for elevated cholesterol: Collected within the past 1 year. HIV testing: Not indicated Hepatitis C: Previously done Syphilis if at high risk: Not at risk Reviewed risk factors for latent tuberculosis and not indicated Colorectal cancer screening: up to date on screening for CRC. PSA: Already screened within the past 1 year, WNL  Follow up in 1 year or sooner if indicated.  MyChart Activation: Already signed up Type 2 diabetes mellitus with other specified complication, unspecified whether long term insulin  use (HCC) -Needs Eye exam, discussed releasing records to our clinic - A1C, BMP, UACR today Hyperlipidemia associated with type 2 diabetes mellitus (HCC) Previously collected. Not taking his Zetia . -Continue atorvastatin . -Recheck Lipid panel in 1 year HFrEF (heart failure with reduced ejection fraction) (HCC) None exacerbation today. - Refilled Entresto , patient out of medication this week. - Continue his GDMT - Follow-up with cardiology routinely  Gladis Church, DO [facility]

## 2024-02-04 NOTE — Assessment & Plan Note (Signed)
 None exacerbation today. - Refilled Entresto , patient out of medication this week. - Continue his GDMT - Follow-up with cardiology routinely

## 2024-02-04 NOTE — Assessment & Plan Note (Signed)
 Previously collected. Not taking his Zetia . -Continue atorvastatin . -Recheck Lipid panel in 1 year

## 2024-02-04 NOTE — Telephone Encounter (Signed)
 [facility] calls nurse line to report a drug to drug interaction.   He reports Entresto  and Spironolactone  together cause a dangerous increase in potassium levels.   Pharmacy suggests the patient hold Spironolactone . This is being prescribed by an outside provider.   He reports he will be holding Entresto  until he gets the green light from PCP to fill safely.   Advised will forward to PCP.

## 2024-02-04 NOTE — Patient Instructions (Addendum)
 It was great to see you! Thank you for allowing me to participate in your care!   I recommend that you always bring your medications to each appointment as this makes it easy to ensure we are on the correct medications and helps us  not miss when refills are needed.  Our plans for today:  - We are checking some labs today, I will call you if they are abnormal will send you a MyChart message or a letter if they are normal.  If you do not hear about your labs in the next 2 weeks please let us  know. - Follow-up in 3 months  Take care and seek immediate care sooner if you develop any concerns. Please remember to show up 15 minutes before your scheduled appointment time!  Gladis Church, DO [facility]

## 2024-02-05 LAB — BASIC METABOLIC PANEL WITH GFR
BUN/Creatinine Ratio: 19 (ref 10–24)
BUN: 19 mg/dL (ref 8–27)
CO2: 20 mmol/L (ref 20–29)
Calcium: 9.6 mg/dL (ref 8.6–10.2)
Chloride: 106 mmol/L (ref 96–106)
Creatinine, Ser: 1.01 mg/dL (ref 0.76–1.27)
Glucose: 81 mg/dL (ref 70–99)
Potassium: 5.2 mmol/L (ref 3.5–5.2)
Sodium: 142 mmol/L (ref 134–144)
eGFR: 82 mL/min/{1.73_m2} (ref >59)

## 2024-02-05 LAB — MICROALBUMIN / CREATININE URINE RATIO
Creatinine, Urine: 68 mg/dL
Microalb/Creat Ratio: <4 mg/g{creat} (ref 0–29)
Microalbumin, Urine: <3 ug/mL

## 2024-02-05 NOTE — Telephone Encounter (Signed)
 This has been confirmed by the pharmacy.

## 2024-02-09 ENCOUNTER — Other Ambulatory Visit: Payer: Self-pay | Admitting: Physician Assistant

## 2024-02-16 ENCOUNTER — Other Ambulatory Visit: Payer: Self-pay | Admitting: Student

## 2024-02-16 DIAGNOSIS — I129 Hypertensive chronic kidney disease with stage 1 through stage 4 chronic kidney disease, or unspecified chronic kidney disease: Secondary | ICD-10-CM

## 2024-02-20 ENCOUNTER — Ambulatory Visit (INDEPENDENT_AMBULATORY_CARE_PROVIDER_SITE_OTHER): Admitting: Student

## 2024-02-20 ENCOUNTER — Encounter: Payer: Self-pay | Admitting: Student

## 2024-02-20 VITALS — BP 104/72 | HR 73 | Ht 66.0 in | Wt 155.4 lb

## 2024-02-20 DIAGNOSIS — I10 Essential (primary) hypertension: Secondary | ICD-10-CM

## 2024-02-20 DIAGNOSIS — L821 Other seborrheic keratosis: Secondary | ICD-10-CM | POA: Diagnosis not present

## 2024-02-20 NOTE — Patient Instructions (Signed)
 It was great to see you! Thank you for allowing me to participate in your care!   I recommend that you always bring your medications to each appointment as this makes it easy to ensure we are on the correct medications and helps us  not miss when refills are needed.  Our plans for today:  - Please go to your pharmacy and get your pneumonia vaccine and second round of shingles vaccine. - Continue taking your current medications -Please follow-up with your cardiologist  - We are checking some labs today, I will call you if they are abnormal will send you a MyChart message or a letter if they are normal.  If you do not hear about your labs in the next 2 weeks please let us  know.  Take care and seek immediate care sooner if you develop any concerns. Please remember to show up 15 minutes before your scheduled appointment time!  Gladis Church, DO [facility]

## 2024-02-20 NOTE — Progress Notes (Signed)
    SUBJECTIVE:   CHIEF COMPLAINT / HPI:   Hypertension Follow-up for blood pressure.  Restarted Entresto  after being off of it for several days.  BMP showed potassium at upper limit of normal.  Lesion on back Nonpainful, stable size, chronic lesion on the back.  Patient would like this to be checked out today.   OBJECTIVE:   BP 104/72   Pulse 73   Ht 5' 6 (1.676 m)   Wt 155 lb 6.4 oz (70.5 kg)   SpO2 99%   BMI 25.08 kg/m    General: NAD, pleasant. Cardio: RRR, no MRG.  Respiratory: CTAB, normal wob on RA Skin: Warm and dry.  Waxy, raised, hyperpigmented, 1 cm x 1 cm, oval lesion consistent with seborrheic keratosis on back.  See below.  Well-demarcated, circumferential, flat hyperpigmented macule approximately 3 mm next to seborrheic keratosis.   ASSESSMENT/PLAN:   Assessment & Plan Essential hypertension Asymptomatic, well-controlled. - Continue metoprolol  50 mg daily - Continue Entresto  24-26 mg twice daily - Continue to spironolactone  12.5 mg daily - Check BMP in setting of higher limit of normal potassium - If potassium mildly elevated, consider holding spironolactone  and follow-up with cardiology - If Potassium significantly elevated consider EKG and treatment Seborrheic keratosis Stable, nonpainful, nonbleeding.  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  He does not want to pursue removal at this time. - Instructed patient if becomes symptomatic or is growing to have this evaluated again   Gladis Church, DO [facility]

## 2024-02-20 NOTE — Assessment & Plan Note (Signed)
 Asymptomatic, well-controlled. - Continue metoprolol  50 mg daily - Continue Entresto  24-26 mg twice daily - Continue to spironolactone  12.5 mg daily - Check BMP in setting of higher limit of normal potassium - If potassium mildly elevated, consider holding spironolactone  and follow-up with cardiology - If Potassium significantly elevated consider EKG and treatment

## 2024-02-21 ENCOUNTER — Ambulatory Visit: Payer: Self-pay | Admitting: Student

## 2024-02-21 LAB — BASIC METABOLIC PANEL WITH GFR
BUN/Creatinine Ratio: 12 (ref 10–24)
BUN: 14 mg/dL (ref 8–27)
CO2: 23 mmol/L (ref 20–29)
Calcium: 9.3 mg/dL (ref 8.6–10.2)
Chloride: 103 mmol/L (ref 96–106)
Creatinine, Ser: 1.18 mg/dL (ref 0.76–1.27)
Glucose: 190 mg/dL — ABNORMAL HIGH (ref 70–99)
Potassium: 5 mmol/L (ref 3.5–5.2)
Sodium: 140 mmol/L (ref 134–144)
eGFR: 68 mL/min/1.73 (ref >59)

## 2024-03-06 ENCOUNTER — Other Ambulatory Visit: Payer: Self-pay | Admitting: Cardiology

## 2024-03-21 ENCOUNTER — Other Ambulatory Visit: Payer: Self-pay | Admitting: Physician Assistant

## 2024-03-30 ENCOUNTER — Other Ambulatory Visit: Payer: Self-pay | Admitting: Student

## 2024-03-30 DIAGNOSIS — E1122 Type 2 diabetes mellitus with diabetic chronic kidney disease: Secondary | ICD-10-CM

## 2024-04-05 ENCOUNTER — Other Ambulatory Visit: Payer: Self-pay | Admitting: Cardiology

## 2024-04-06 ENCOUNTER — Other Ambulatory Visit: Payer: Self-pay | Admitting: Physician Assistant

## 2024-04-19 ENCOUNTER — Other Ambulatory Visit: Payer: Self-pay | Admitting: Physician Assistant

## 2024-04-20 ENCOUNTER — Other Ambulatory Visit: Payer: Self-pay | Admitting: Student

## 2024-04-20 DIAGNOSIS — E1169 Type 2 diabetes mellitus with other specified complication: Secondary | ICD-10-CM

## 2024-04-25 ENCOUNTER — Other Ambulatory Visit: Payer: Self-pay | Admitting: Physician Assistant

## 2024-05-15 ENCOUNTER — Other Ambulatory Visit: Payer: Self-pay | Admitting: Student

## 2024-05-15 DIAGNOSIS — E1122 Type 2 diabetes mellitus with diabetic chronic kidney disease: Secondary | ICD-10-CM

## 2024-06-29 ENCOUNTER — Other Ambulatory Visit: Payer: Self-pay | Admitting: Student

## 2024-06-29 DIAGNOSIS — E1122 Type 2 diabetes mellitus with diabetic chronic kidney disease: Secondary | ICD-10-CM

## 2024-07-12 ENCOUNTER — Other Ambulatory Visit: Payer: Self-pay | Admitting: Student

## 2024-07-12 ENCOUNTER — Ambulatory Visit

## 2024-07-12 DIAGNOSIS — E1169 Type 2 diabetes mellitus with other specified complication: Secondary | ICD-10-CM

## 2024-08-10 DIAGNOSIS — E1169 Type 2 diabetes mellitus with other specified complication: Secondary | ICD-10-CM

## 2024-09-10 ENCOUNTER — Other Ambulatory Visit: Payer: Self-pay | Admitting: Student

## 2024-09-10 DIAGNOSIS — E1169 Type 2 diabetes mellitus with other specified complication: Secondary | ICD-10-CM
